# Patient Record
Sex: Female | Born: 1945 | Race: White | Hispanic: No | Marital: Married | Smoking: Never smoker
Health system: Southern US, Community
[De-identification: ages and names within clinical notes are randomized; demographics above are authoritative.]

## PROBLEM LIST (undated history)

## (undated) DIAGNOSIS — K589 Irritable bowel syndrome without diarrhea: Secondary | ICD-10-CM

## (undated) DIAGNOSIS — C4491 Basal cell carcinoma of skin, unspecified: Secondary | ICD-10-CM

## (undated) DIAGNOSIS — G4733 Obstructive sleep apnea (adult) (pediatric): Secondary | ICD-10-CM

## (undated) DIAGNOSIS — F410 Panic disorder [episodic paroxysmal anxiety] without agoraphobia: Secondary | ICD-10-CM

## (undated) DIAGNOSIS — E669 Obesity, unspecified: Secondary | ICD-10-CM

## (undated) DIAGNOSIS — I4891 Unspecified atrial fibrillation: Secondary | ICD-10-CM

## (undated) DIAGNOSIS — N3944 Nocturnal enuresis: Secondary | ICD-10-CM

## (undated) HISTORY — DX: Obesity, unspecified: E66.9

## (undated) HISTORY — PX: TONSILLECTOMY: SUR1361

## (undated) HISTORY — DX: Obstructive sleep apnea (adult) (pediatric): G47.33

---
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
## 1998-12-29 ENCOUNTER — Encounter: Admission: RE | Admit: 1998-12-29 | Discharge: 1998-12-29 | Payer: Self-pay | Admitting: Family Medicine

## 1998-12-29 ENCOUNTER — Encounter: Payer: Self-pay | Admitting: Family Medicine

## 2000-03-06 ENCOUNTER — Encounter: Payer: Self-pay | Admitting: Family Medicine

## 2000-03-06 ENCOUNTER — Encounter: Admission: RE | Admit: 2000-03-06 | Discharge: 2000-03-06 | Payer: Self-pay | Admitting: Family Medicine

## 2000-03-10 ENCOUNTER — Other Ambulatory Visit: Admission: RE | Admit: 2000-03-10 | Discharge: 2000-03-10 | Payer: Self-pay | Admitting: Gynecology

## 2000-04-18 ENCOUNTER — Encounter: Admission: RE | Admit: 2000-04-18 | Discharge: 2000-04-18 | Payer: Self-pay | Admitting: Gynecology

## 2000-04-18 ENCOUNTER — Encounter: Payer: Self-pay | Admitting: Gynecology

## 2001-12-03 ENCOUNTER — Ambulatory Visit (HOSPITAL_COMMUNITY): Admission: RE | Admit: 2001-12-03 | Discharge: 2001-12-03 | Payer: Self-pay | Admitting: Gastroenterology

## 2001-12-03 ENCOUNTER — Encounter (INDEPENDENT_AMBULATORY_CARE_PROVIDER_SITE_OTHER): Payer: Self-pay | Admitting: *Deleted

## 2002-01-21 ENCOUNTER — Encounter: Admission: RE | Admit: 2002-01-21 | Discharge: 2002-01-21 | Payer: Self-pay | Admitting: Gynecology

## 2002-01-21 ENCOUNTER — Encounter: Payer: Self-pay | Admitting: Gynecology

## 2002-05-27 ENCOUNTER — Emergency Department (HOSPITAL_COMMUNITY): Admission: EM | Admit: 2002-05-27 | Discharge: 2002-05-27 | Payer: Self-pay | Admitting: Emergency Medicine

## 2002-05-27 ENCOUNTER — Encounter: Payer: Self-pay | Admitting: Emergency Medicine

## 2002-06-04 ENCOUNTER — Encounter: Admission: RE | Admit: 2002-06-04 | Discharge: 2002-06-04 | Payer: Self-pay | Admitting: Family Medicine

## 2002-06-04 ENCOUNTER — Encounter: Payer: Self-pay | Admitting: Family Medicine

## 2002-06-11 ENCOUNTER — Emergency Department (HOSPITAL_COMMUNITY): Admission: EM | Admit: 2002-06-11 | Discharge: 2002-06-11 | Payer: Self-pay | Admitting: Emergency Medicine

## 2002-10-11 ENCOUNTER — Encounter: Payer: Self-pay | Admitting: Family Medicine

## 2002-10-11 ENCOUNTER — Encounter: Admission: RE | Admit: 2002-10-11 | Discharge: 2002-10-11 | Payer: Self-pay | Admitting: Family Medicine

## 2003-01-10 ENCOUNTER — Other Ambulatory Visit: Admission: RE | Admit: 2003-01-10 | Discharge: 2003-01-10 | Payer: Self-pay | Admitting: Family Medicine

## 2003-01-24 ENCOUNTER — Encounter: Admission: RE | Admit: 2003-01-24 | Discharge: 2003-01-24 | Payer: Self-pay | Admitting: Family Medicine

## 2004-02-09 ENCOUNTER — Encounter: Admission: RE | Admit: 2004-02-09 | Discharge: 2004-02-09 | Payer: Self-pay | Admitting: Family Medicine

## 2006-08-27 ENCOUNTER — Ambulatory Visit (HOSPITAL_COMMUNITY): Admission: RE | Admit: 2006-08-27 | Discharge: 2006-08-27 | Payer: Self-pay | Admitting: Gastroenterology

## 2006-08-27 ENCOUNTER — Encounter (INDEPENDENT_AMBULATORY_CARE_PROVIDER_SITE_OTHER): Payer: Self-pay | Admitting: Gastroenterology

## 2007-02-11 ENCOUNTER — Inpatient Hospital Stay (HOSPITAL_COMMUNITY): Admission: RE | Admit: 2007-02-11 | Discharge: 2007-02-13 | Payer: Self-pay | Admitting: Orthopedic Surgery

## 2008-12-22 ENCOUNTER — Encounter: Admission: RE | Admit: 2008-12-22 | Discharge: 2008-12-22 | Payer: Self-pay | Admitting: Family Medicine

## 2009-02-01 ENCOUNTER — Encounter: Admission: RE | Admit: 2009-02-01 | Discharge: 2009-02-01 | Payer: Self-pay | Admitting: Family Medicine

## 2010-06-26 NOTE — Op Note (Signed)
NAMEJANNICE, BEITZEL                   ACCOUNT NO.:  [id number]   MEDICAL RECORD NO.:  [id number]          PATIENT TYPE:  AMB   LOCATION:  ENDO                         FACILITY:  [facility]   PHYSICIAN:  Anselmo Rod, M.D.  DATE OF BIRTH:  1945-05-16   DATE OF PROCEDURE:  08/27/2006  DATE OF DISCHARGE:                               OPERATIVE REPORT   PROCEDURE PERFORMED:  Colonoscopy with multiple cold biopsies.   ENDOSCOPIST:  Anselmo Rod, M.D.   INSTRUMENT USED:  Pentax video colonoscope.   INDICATIONS FOR PROCEDURE:  A 65 year old white female with a history of  diarrhea undergoing a colonoscopy to rule out colonic polyps, masses,  etc.  Colonic biopsies are planned to rule out collagenous colitis if no  other abnormalities are noted.   PRE-PROCEDURE PREPARATION:  Informed consent was procured from the  patient.  The patient was fasted for eight hours prior to the procedure  and prepped with a bottle of magnesium citrate and a gallon of NuLytely  the night prior to the procedure.  Risks and benefits of the procedure,  including a 10% missed rate of cancer and polyps, were discussed with  the patient as well.   PRE-PROCEDURE PHYSICAL EXAMINATION:  VITAL SIGNS:  Stable vital signs.  NECK:  Supple.  CHEST:  Clear to auscultation.  HEART:  S1 and S2.  Regular.  ABDOMEN:  Soft with normal bowel sounds.   DESCRIPTION OF PROCEDURE:  The patient was placed in the left lateral  decubitus position and sedated with an additional 75 mcg of Fentanyl and  8 mg of Versed given intravenously in slow, incremental doses. Once the  patient was adequately sedated, maintained on low flow oxygen, and  continuous cardiac monitoring, the Pentax video colonoscope was advanced  from the rectum to the cecum.  The appendicle orifices and ileocecal  valve were visualized after multiple washes.  There was some residual  stool in the right colon.  The terminal ileum appeared healthy and  without lesions.   Small internal hemorrhoids were seen on retroflexion.  No masses, erosions, ulcerations, or polyps were identified. There was  no evidence of diverticulosis.  Random colonic biopsies were done to  rule out collagenous versus lymphocytic colitis. The patient tolerated  the procedure well without immediate complications.   IMPRESSION:  1. Normal colonoscopy up to the terminal ileum, random colon biopsies      done to rule out collagenous versus lymphocytic colitis.  2. Small internal hemorrhoids seen on retroflexion.  3. A normal terminal ileum.  4. No masses, polyps, or diverticula noted.   RECOMMENDATIONS:  1. Await pathology results.  2. Avoid all nonsteroidals and aspirin for the next two weeks.  3. Repeat colonoscopy in the next 10 years unless the patient has any      abnormal symptoms in the interim.  4. Outpatient followup as the needs arise in the future.  The patient      tells me that the Align she was started on has helped her symptoms      greatly.  Anselmo Rod, M.D.  Electronically Signed     JNM/MEDQ  D:  08/27/2006  T:  08/27/2006  Job:  [id number]   cc:   Talmadge Coventry, M.D.  Fax: [phone]

## 2010-06-26 NOTE — Op Note (Signed)
Beth Haynes, Beth Haynes                   ACCOUNT NO.:  [id number]   MEDICAL RECORD NO.:  [id number]          PATIENT TYPE:  AMB   LOCATION:  ENDO                         FACILITY:  [facility]   PHYSICIAN:  Anselmo Rod, M.D.  DATE OF BIRTH:  Feb 19, 1945   DATE OF PROCEDURE:  08/27/2006  DATE OF DISCHARGE:                               OPERATIVE REPORT   PROCEDURE PERFORMED:  Esophagogastroduodenoscopy with small-bowel  biopsies.   ENDOSCOPIST:  Anselmo Rod.   INSTRUMENT USED:  Pentax video colonoscope.   INDICATIONS FOR PROCEDURE:  A 65 year old white female with a history of  diarrhea, rule out sprue.  Small-bowel biopsies planned.   PREPROCEDURE PREPARATION:  Informed consent was procured from the  patient.  The patient was fasted for 8 hours prior to procedure.  Risks  and benefits of the procedure were discussed with the patient in great  detail.   PREPROCEDURE PHYSICAL:  The patient had stable vital signs.  NECK:  Supple.  CHEST:  Clear to auscultation.  S1/S2 regular.  ABDOMEN:  Soft with normal bowel sounds.   DESCRIPTION OF PROCEDURE:  The patient was placed in the left lateral  decubitus position and sedated with 50 mcg of Fntanyl and 4 mg of Versed  given intravenously in slow incremental doses. Once the patient was  adequately sedated and maintained on low-flow oxygen and continuous  cardiac monitoring, the Pentax video panendoscope was advanced through  the mouthpiece over the tongue into the esophagus under direct vision.  The entire esophagus was widely patent with no evidence of ring,  stricture, mass, esophagitis or Barrett's mucosa. The scope was then  advanced into the stomach.  The Z-line appeared healthy. There was a  small hiatal hernia seen on high retroflexion, and mild antral gastritis  was noted.  The proximal small bowel appeared normal.  Small-bowel  biopsies were done to rule out sprue.  The patient tolerated the  procedure well without immediate  complications.   IMPRESSION:  1. Normal-appearing esophagus.  2. Small hiatal hernia.  3. Mild antral gastritis.  4. Normal proximal small bowel. Small-bowel biopsies done to rule out      sprue.  5. No ulcers, erosions, masses or polyps seen.   RECOMMENDATIONS:  1. Await pathology results.  2. Avoid all nonsteroidals for now.  3. No aspirin products or Advil or Aleve for the next 2 weeks.  4. Proceed with a colonoscopy at this time.  5. Further recommendations to be made thereafter.      Anselmo Rod, M.D.  Electronically Signed     JNM/MEDQ  D:  08/27/2006  T:  08/28/2006  Job:  [id number]   cc:   Talmadge Coventry, M.D.  Fax: [phone]

## 2010-06-26 NOTE — Discharge Summary (Signed)
Beth Haynes, Beth Haynes                   ACCOUNT NO.:  [id number]   MEDICAL RECORD NO.:  [id number]          PATIENT TYPE:  OIB   LOCATION:  5025                         FACILITY:  [facility]   PHYSICIAN:  Madelynn Done, MD  DATE OF BIRTH:  [birth year]   DATE OF ADMISSION:  02/11/2007  DATE OF DISCHARGE:  02/13/2007                               DISCHARGE SUMMARY   ADMISSION DIAGNOSES:  1. Left displaced fibula fracture.  2. Hypertension.   DISCHARGE DIAGNOSES:  1. Left displaced fibula fracture.  2. Hypertension.   PROCEDURES AND DATES:  Open reduction/internal fixation of left  displaced fibular fracture on February 11, 2007.   DISCHARGE MEDICATIONS:  1. Vicodin 1 to 2 tablets p.o. q.4-6 h. p.r.n. pain.  2. Colace 100 mg p.o. b.i.d.  3. Multivitamin 1 tablet p.o. daily.   CONSULTATION:  [facility] for home health and home PT.   REASON FOR ADMISSION:  [name] is a 65 year old female who fell  earlier this past week and sustained a displaced distal fibular  fracture.  Patient was seen and evaluated in my office on February 11, 2007, and after counseling with the patient, was admitted to the  hospital to undergo surgery to fix her displaced fibula fracture.   HOSPITAL COURSE:  Patient was admitted to the orthopedic floor following  the above procedure.  Patient tolerated the procedure well under general  anesthesia.  She was continued on IV antibiotics and oral and IV pain  medications postoperatively.  Home health and home PT were arranged on  the New Year's day.  Patient was seen and examined on postoperative day  number 2.  She was afebrile, her vital signs were stable and normal, she  was tolerating a regular diet, and felt to be ready to be discharged to  home on hospital day number 2.  On the day of discharge, the patient was  ambulating, nonweightbearing on the left lower extremity.  She was  tolerating a regular diet.  Her pain was controlled on oral pain  medications.   RECOMMENDATIONS AND DISPOSITION:  Patient will be discharged to home.  She will be followed up in the office on February 23, 2007.  She was  given the office telephone phone number to call if she has any problems  sooner.  She is going to keep her splint clean and dry, elevate, and  take the oral pain medication.  She needs to come back sooner if she has  any problems with her leg, fevers, chills, nausea, vomiting, worsening  pain or swelling in the left foot and ankle.  Prior to her discharge,  the patient's discharge instructions were explained to her, questions  were  answered, and the patient voiced understanding of discharge  instructions.  All questions were addressed prior to discharge.   CONDITION ON DISCHARGE:  Good.      Madelynn Done, MD  Electronically Signed     FWO/MEDQ  D:  02/13/2007  T:  02/13/2007  Job:  [id number]

## 2010-06-26 NOTE — Op Note (Signed)
Beth Haynes, Beth Haynes                   ACCOUNT NO.:  [id number]   MEDICAL RECORD NO.:  [id number]          PATIENT TYPE:  OIB   LOCATION:  5025                         FACILITY:  [facility]   PHYSICIAN:  Madelynn Done, MD  DATE OF BIRTH:  1945/02/14   DATE OF PROCEDURE:  02/11/2007  DATE OF DISCHARGE:                               OPERATIVE REPORT   PREOPERATIVE DIAGNOSIS:  Left distal fibular fracture, Weber B ,  displaced.   POSTOPERATIVE DIAGNOSIS:  Left distal fibular fracture, Weber B ,  displaced.   ATTENDING SURGEON:  Madelynn Done, MD, who was scrubbed and present  for the entire procedure.   ASSISTANT SURGEON:  None.   TOURNIQUET TIME:  Less than 1 hour at 300 mmHg.   ANESTHESIA:  General via laryngeal mask airway.   SURGICAL IMPLANT:  One 5 hole Synthes one-third tubular plate with five  35 cortical screws and one lag screw anterior to posterior.   SURGICAL INDICATIONS:  Beth Haynes is a 65 year old female who sustained a  closed left ankle fracture approximately three days ago.  She had been  walking on the ankle and was seen today in the office and noted to have  a displaced distal fibular fracture.  The patient was noted have medial  clear interval widening the displacement of the fibula and irregularity  the tibiotalar joint.  The patient was recommended to undergo the above  procedure.  Risks, benefits, alternatives were discussed in detail with  the patient.  Signed informed consent was obtained on the day of  surgery.   DESCRIPTION OF PROCEDURE:  The patient was properly identified in preop  holding area and a mark with a permanent marker was made on left ankle  to indicate the correct operative site.  The patient was then brought  back to the operating room, placed supine on the anesthesia room table.  General anesthesia was administered via LMA.  The patient previously had  undergone a popliteal block performed by Anesthesia.  He tolerated this  well.   Well-padded tourniquet was then placed on the left thigh and  sealed with U drapes.  The patient was then placed in a lazy lateral  position with the aid of a beanbag.  Left lower extremity were prepped  and draped with Betadine.  A time-out was called, the correct side was  identified and surgical procedure was then begun.   The limb was then elevated using Esmarch exsanguination tourniquet  insufflated.  A standard lateral approach to the distal fibula was then  used.  Dissection was carried down through the skin and subcutaneous  tissues.  Subcutaneous veins were then identified and using  electrocautery were then cauterized.  Dissection was carefully done to  protect the coursing branches of the superficial peroneal nerve.  Dissection was carried down all the way down to the bone over the  fracture site was then exposed.  Using the periosteal elevator the  fracture site was then exposed both proximally and distally.  Using a  small curette the fracture hematoma was then removed  and the fracture  site thoroughly irrigated.  After debridement of the fracture site, the  reduction clamp was then applied to the posterior lateral portion of the  distal fibula.  A five hole antiglide plate was then fashioned on the  posterior lateral surface of the fibula.  It was then fixed both  proximally and distally with two C3-5 cortical screws at the appropriate  drilling and depth gauge measurement.  An anterior-posterior lag screw  was then applied with compression across the fracture site by drilling  the far cortex and overdrilling the near cortex.  A 35 cortical screw  was then applied.  This was done in a lag fashion.  The remaining screw  holes were then filled with the tubular plate with a 35 bicortical  screws.  Following fixation the mini C-arm was then brought out of  brought in and under live stress radiography the ankle placed through a  range of motion and stress testing and cotton  testing was then performed  of the distal fibula and did not appear to be any significant widening  of syndesmosis and the syndesmosis did not appear to need additional  fixation.  There was good restoration of the alignment of the tibiotalar  joint after fixation of the distal fibula.  The wound was then  thoroughly irrigated.  The deep periosteal layer was then closed with a  2-0 Vicryl suture.  Subcutaneous tissues were then closed with 3-0  Vicryl and skin was then closed with a 4-0 running horizontal mattress  nylon suture.  18 mL of 0.25% Marcaine without epinephrine were then  injected locally for local field block.  Adaptic dressing was then  applied.  Sterile compressive dressing was then applied and the patient  was then placed in a well padded trilaminar splint.  He was extubated  and taken to recovery room in good condition.   Intraoperative radiograph three views of the ankle revealed good  anatomical alignment of the distal fibula with the five hole plate and  one anterior to posterior screw in place.  There did not appear to be  displacement under live fluoro and stress radiography.   POSTOPERATIVE PLAN:  The patient be admitted overnight for IV  antibiotics and postoperative pain control, physical therapy consult in  the morning for gait training.  She will likely discharged home  nonweightbearing to the first 4 weeks.  She will then be transitioned to  a short-arm cast.  She will be seen back in the office in 2 weeks for  application of short-leg cast and x-rays out of the splint.  Following  total of 4 weeks in a nonweightbearing short leg cast and transition  into a Cam walker boot.  Total of 6 weeks nonweightbearing.  Will begin  some active motion at the 4-week mark.  X-rays at the 2, 4 and 6 week  mark.      Madelynn Done, MD  Electronically Signed     FWO/MEDQ  D:  02/11/2007  T:  02/12/2007  Job:  [id number]

## 2010-06-29 NOTE — Op Note (Signed)
Beth Haynes, Beth Haynes                             ACCOUNT NO.:  [id number]   MEDICAL RECORD NO.:  [id number]                   PATIENT TYPE:  AMB   LOCATION:  ENDO                                 FACILITY:  [facility]   PHYSICIAN:  Anselmo Rod, M.D.               DATE OF BIRTH:  Aug 27, 1945   DATE OF PROCEDURE:  12/03/2001  DATE OF DISCHARGE:                                 OPERATIVE REPORT   PROCEDURE:  Colonoscopy with biopsies.   ENDOSCOPIST:  Anselmo Rod, M.D.   INSTRUMENT USED:  Olympus video colonoscope.   INDICATION FOR PROCEDURE:  A 65 year old white female undergoing screening  colonoscopy.  Rule out colonic polyps, masses, etc.   PREPROCEDURE PREPARATION:  Informed consent was procured from the patient.  The patient was fasted for eight hours prior to the procedure and prepped  with a bottle of magnesium citrate and a gallon of NuLytely the night prior  to the procedure.   PREPROCEDURE PHYSICAL:  VITAL SIGNS:  The patient had stable vital signs.  NECK:  Supple.  CHEST:  Clear to auscultation.  S1, S2 regular.  ABDOMEN:  Soft with normal bowel sounds.   DESCRIPTION OF PROCEDURE:  The patient was placed in the left lateral  decubitus position and sedated with 100 mg of Demerol and 10 mg of Versed  intravenously.  Once the patient was adequately sedate and maintained on low-  flow oxygen and continuous cardiac monitoring, the Olympus video colonoscope  was advanced from the rectum to the cecum and terminal ileum without  difficulty.  The appendiceal orifice and the ileocecal valve were clearly  visualized and photographed.  Erythematous changes were seen in the terminal  ileum with small ulceration.  This was biopsied to rule out IBD.  Small  internal hemorrhoids were seen on retroflexion.  The rest of the colonic  mucosa appeared normal and without lesions.  There were a few left-sided  diverticula appreciated as well.  The patient tolerated the procedure well  without complications.   IMPRESSION:  1. Small, nonbleeding internal hemorrhoid.  2. A few early left-sided diverticula.  3. Otherwise normal-appearing transverse colon, right colon, and cecum.  4. Erythematous mucosa with ulceration in terminal ileum, biopsies done to     rule out inflammatory bowel disease versus infectious ileitis.    RECOMMENDATIONS:  1. Await pathology results.  2. Avoid all nonsteroidals, including aspirin, for now.  3. Outpatient follow-up in the next two weeks for further recommendations.                                               Anselmo Rod, M.D.    JNM/MEDQ  D:  12/03/2001  T:  12/03/2001  Job:  [id number]   cc:  Talmadge Coventry, M.D.

## 2010-11-16 LAB — BASIC METABOLIC PANEL
CO2: 28
Calcium: 8.8
Creatinine, Ser: 0.57
GFR calc Af Amer: >60
Glucose, Bld: 99

## 2010-11-16 LAB — CBC
MCHC: 33.8
RDW: 14.3

## 2011-04-19 ENCOUNTER — Other Ambulatory Visit: Payer: Self-pay | Admitting: Internal Medicine

## 2011-04-19 DIAGNOSIS — Z1231 Encounter for screening mammogram for malignant neoplasm of breast: Secondary | ICD-10-CM

## 2011-05-03 ENCOUNTER — Ambulatory Visit
Admission: RE | Admit: 2011-05-03 | Discharge: 2011-05-03 | Disposition: A | Discharge status: Home / Self Care | Payer: Medicare Other | Source: Ambulatory Visit | Attending: Internal Medicine | Admitting: Internal Medicine

## 2011-05-03 DIAGNOSIS — Z1231 Encounter for screening mammogram for malignant neoplasm of breast: Secondary | ICD-10-CM

## 2014-04-26 ENCOUNTER — Other Ambulatory Visit: Payer: Self-pay

## 2014-04-26 DIAGNOSIS — Z1231 Encounter for screening mammogram for malignant neoplasm of breast: Secondary | ICD-10-CM

## 2014-05-13 ENCOUNTER — Ambulatory Visit
Admission: RE | Admit: 2014-05-13 | Discharge: 2014-05-13 | Disposition: A | Discharge status: Home / Self Care | Payer: Medicare Other | Source: Ambulatory Visit

## 2014-05-13 DIAGNOSIS — Z1231 Encounter for screening mammogram for malignant neoplasm of breast: Secondary | ICD-10-CM

## 2014-08-05 ENCOUNTER — Other Ambulatory Visit: Payer: Self-pay

## 2014-08-24 ENCOUNTER — Emergency Department (HOSPITAL_BASED_OUTPATIENT_CLINIC_OR_DEPARTMENT_OTHER)
Admission: EM | Admit: 2014-08-24 | Discharge: 2014-08-24 | Disposition: A | Discharge status: Home / Self Care | Payer: Medicare Other | Attending: Emergency Medicine | Admitting: Emergency Medicine

## 2014-08-24 ENCOUNTER — Encounter (HOSPITAL_BASED_OUTPATIENT_CLINIC_OR_DEPARTMENT_OTHER): Payer: Self-pay | Admitting: *Deleted

## 2014-08-24 ENCOUNTER — Emergency Department (HOSPITAL_BASED_OUTPATIENT_CLINIC_OR_DEPARTMENT_OTHER): Payer: Medicare Other

## 2014-08-24 DIAGNOSIS — Z79899 Other long term (current) drug therapy: Secondary | ICD-10-CM | POA: Insufficient documentation

## 2014-08-24 DIAGNOSIS — S52592A Other fractures of lower end of left radius, initial encounter for closed fracture: Secondary | ICD-10-CM | POA: Diagnosis not present

## 2014-08-24 DIAGNOSIS — S62102A Fracture of unspecified carpal bone, left wrist, initial encounter for closed fracture: Secondary | ICD-10-CM

## 2014-08-24 DIAGNOSIS — W1839XA Other fall on same level, initial encounter: Secondary | ICD-10-CM | POA: Diagnosis not present

## 2014-08-24 DIAGNOSIS — S6992XA Unspecified injury of left wrist, hand and finger(s), initial encounter: Secondary | ICD-10-CM | POA: Diagnosis present

## 2014-08-24 DIAGNOSIS — Y9289 Other specified places as the place of occurrence of the external cause: Secondary | ICD-10-CM | POA: Diagnosis not present

## 2014-08-24 DIAGNOSIS — Y9301 Activity, walking, marching and hiking: Secondary | ICD-10-CM | POA: Insufficient documentation

## 2014-08-24 DIAGNOSIS — Y998 Other external cause status: Secondary | ICD-10-CM | POA: Insufficient documentation

## 2014-08-24 MED ORDER — IBUPROFEN 800 MG PO TABS: 800.0000 mg | ORAL_TABLET | Freq: Three times a day (TID) | ORAL | Status: DC

## 2014-08-24 MED ORDER — IBUPROFEN 800 MG PO TABS
800.0000 mg | ORAL_TABLET | Freq: Once | ORAL | Status: AC
  Administered 2014-08-24: 800 mg via ORAL
  Filled 2014-08-24: qty 1

## 2014-08-24 NOTE — ED Notes (Signed)
States she was walking dog and fell onto grass. C/o left wrist pain. Swelling noted to left wrist.  Pos pulses and nl sensation and cap refill.

## 2014-08-24 NOTE — ED Notes (Signed)
MD at bedside. 

## 2014-08-24 NOTE — Discharge Instructions (Signed)
Take motrin for pain.  Apply ice to the wrist.   Please wear the splint at all times  See ortho or hand doctor when you come back in 10 days.   Return to ER if you have worse wrist pain, fingers turning blue.

## 2014-08-24 NOTE — ED Provider Notes (Signed)
CSN: [id number]     Arrival date & time 08/24/14  1749 History  This chart was scribed for Wandra Arthurs, MD by Steva Colder, ED Scribe. The patient was seen in room MH01/MH01 at [place] PM.     Chief Complaint  Patient presents with  . Fall     The history is provided by the patient. No language interpreter was used.    HPI Comments: Beth Haynes is a 69 y.o. female who presents to the Emergency Department complaining of fall onset PTA. Pt reports that she was walking the dog when the dog walked in another direction and she fell on the grass. She states that she is having associated symptoms of left wrist pain and left wrist swelling. She states that she has not tried any medications for the  relief for her symptoms. She denies CP, abdominal pain, hitting her head, and any other symptoms.   History reviewed. No pertinent past medical history. History reviewed. No pertinent past surgical history. No family history on file. History  Substance Use Topics  . Smoking status: Never Smoker   . Smokeless tobacco: Not on file  . Alcohol Use: Not on file   OB History    No data available     Review of Systems  Cardiovascular: Negative for chest pain.  Gastrointestinal: Negative for abdominal pain.  Musculoskeletal: Positive for joint swelling and arthralgias.  All other systems reviewed and are negative.     Allergies  Sulfa antibiotics  Home Medications   Prior to Admission medications   Medication Sig Start Date End Date Taking? Authorizing Provider  sertraline (ZOLOFT) 100 MG tablet Take 100 mg by mouth daily.   Yes Historical Provider, MD  sertraline (ZOLOFT) 50 MG tablet Take 50 mg by mouth daily.   Yes Historical Provider, MD   BP 186/98 mmHg  Pulse 75  Temp(Src) 98.2 F (36.8 C) (Oral)  Resp 16  Ht 5\' 2"  (1.575 m)  Wt 192 lb (87.091 kg)  BMI 35.11 kg/m2  SpO2 98% Physical Exam  Constitutional: She is oriented to person, place, and time. She appears well-developed and  well-nourished. No distress.  HENT:  Head: Normocephalic and atraumatic.  Eyes: EOM are normal.  Neck: Neck supple. No tracheal deviation present.  Cardiovascular: Normal rate.   Pulses:      Radial pulses are 2+ on the right side, and 2+ on the left side.  Pulmonary/Chest: Effort normal. No respiratory distress.  Musculoskeletal: Normal range of motion. She exhibits tenderness.       Left wrist: She exhibits tenderness. She exhibits normal range of motion.  Tenderness in the left wrist. Able to range left wrist. Nl hand grasp. Good cap refill.  Neurological: She is alert and oriented to person, place, and time.  Skin: Skin is warm and dry.  Psychiatric: She has a normal mood and affect. Her behavior is normal.  Nursing note and vitals reviewed.   ED Course  Procedures (including critical care time) DIAGNOSTIC STUDIES: Oxygen Saturation is 98% on RA, nl by my interpretation.    COORDINATION OF CARE: 6:15 PM-Discussed treatment plan with pt at bedside and pt agreed to plan.   Labs Review Labs Reviewed - No data to display  Imaging Review Dg Wrist Complete Left  08/24/2014   CLINICAL DATA:  Status post fall.  Left wrist pain.  EXAM: LEFT WRIST - COMPLETE 3+ VIEW  COMPARISON:  None.  FINDINGS: There is subtle cortical irregularity along the dorsal aspect  of the left distal radial metaphysis which may be projectional versus secondary to a nondisplaced fracture. Correlate with point tenderness.  There is no other acute fracture. There is generalized osteopenia. 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 Soft tissues are unremarkable.  IMPRESSION: 1. Subtle cortical irregularity along the dorsal aspect of the left distal radial metaphysis which may be projectional versus secondary to a nondisplaced fracture. Correlate with point tenderness.   Electronically Signed   By: Kathreen Devoid   On: 08/24/2014 18:56     EKG Interpretation None      MDM   Final diagnoses:  None    Beth Haynes is a 70 y.o. female here with L wrist injury. Consider sprain vs fracture. Will get xrays.   7:18 PM Xray showed possible fracture. She is traveling in several days and will be back in 10 days. Will place in splint and sling and have her see hand surgery when she comes back.   I personally performed the services described in this documentation, which was scribed in my presence. The recorded information has been reviewed and is accurate.    Wandra Arthurs, MD 08/24/14 [phone]

## 2014-10-10 ENCOUNTER — Encounter (HOSPITAL_COMMUNITY): Payer: Self-pay | Admitting: *Deleted

## 2014-10-10 ENCOUNTER — Emergency Department (HOSPITAL_COMMUNITY)
Admission: EM | Admit: 2014-10-10 | Discharge: 2014-10-11 | Disposition: A | Discharge status: Home / Self Care | Payer: Medicare Other | Attending: Emergency Medicine | Admitting: Emergency Medicine

## 2014-10-10 ENCOUNTER — Emergency Department (HOSPITAL_COMMUNITY): Payer: Medicare Other

## 2014-10-10 DIAGNOSIS — I4891 Unspecified atrial fibrillation: Secondary | ICD-10-CM | POA: Diagnosis not present

## 2014-10-10 DIAGNOSIS — Z79899 Other long term (current) drug therapy: Secondary | ICD-10-CM | POA: Diagnosis not present

## 2014-10-10 DIAGNOSIS — Z8719 Personal history of other diseases of the digestive system: Secondary | ICD-10-CM | POA: Insufficient documentation

## 2014-10-10 DIAGNOSIS — R42 Dizziness and giddiness: Secondary | ICD-10-CM

## 2014-10-10 DIAGNOSIS — R110 Nausea: Secondary | ICD-10-CM | POA: Insufficient documentation

## 2014-10-10 HISTORY — DX: Panic disorder (episodic paroxysmal anxiety): F41.0

## 2014-10-10 HISTORY — DX: Irritable bowel syndrome, unspecified: K58.9

## 2014-10-10 LAB — BASIC METABOLIC PANEL
Anion gap: 11 (ref 5–15)
BUN: 10 mg/dL (ref 6–20)
CALCIUM: 9.9 mg/dL (ref 8.9–10.3)
CO2: 23 mmol/L (ref 22–32)
CREATININE: 0.75 mg/dL (ref 0.44–1.00)
Chloride: 108 mmol/L (ref 101–111)
GFR calc non Af Amer: >60 mL/min (ref >60)
GLUCOSE: 130 mg/dL — AB (ref 65–99)
Potassium: 4.2 mmol/L (ref 3.5–5.1)
Sodium: 142 mmol/L (ref 135–145)

## 2014-10-10 LAB — URINE MICROSCOPIC-ADD ON

## 2014-10-10 LAB — I-STAT TROPONIN, ED: Troponin i, poc: 0.01 ng/mL (ref 0.00–0.08)

## 2014-10-10 LAB — CBC
HCT: 49.3 % — ABNORMAL HIGH (ref 36.0–46.0)
Hemoglobin: 16.7 g/dL — ABNORMAL HIGH (ref 12.0–15.0)
MCH: 29.2 pg (ref 26.0–34.0)
MCHC: 33.9 g/dL (ref 30.0–36.0)
MCV: 86.3 fL (ref 78.0–100.0)
PLATELETS: 234 10*3/uL (ref 150–400)
RBC: 5.71 MIL/uL — AB (ref 3.87–5.11)
RDW: 14.4 % (ref 11.5–15.5)
WBC: 6.9 10*3/uL (ref 4.0–10.5)

## 2014-10-10 LAB — URINALYSIS, ROUTINE W REFLEX MICROSCOPIC
BILIRUBIN URINE: NEGATIVE
GLUCOSE, UA: NEGATIVE mg/dL
HGB URINE DIPSTICK: NEGATIVE
KETONES UR: 15 mg/dL — AB
Leukocytes, UA: NEGATIVE
Nitrite: NEGATIVE
PH: 6 (ref 5.0–8.0)
PROTEIN: 30 mg/dL — AB
Specific Gravity, Urine: 1.02 (ref 1.005–1.030)
Urobilinogen, UA: 0.2 mg/dL (ref 0.0–1.0)

## 2014-10-10 LAB — CBG MONITORING, ED: Glucose-Capillary: 118 mg/dL — ABNORMAL HIGH (ref 65–99)

## 2014-10-10 MED ORDER — SODIUM CHLORIDE 0.9 % IV BOLUS (SEPSIS)
1000.0000 mL | Freq: Once | INTRAVENOUS | Status: AC
  Administered 2014-10-10: 1000 mL via INTRAVENOUS

## 2014-10-10 MED ORDER — ONDANSETRON HCL 4 MG/2ML IJ SOLN
4.0000 mg | Freq: Once | INTRAMUSCULAR | Status: AC
Start: 2014-10-10 — End: 2014-10-10
  Administered 2014-10-10: 4 mg via INTRAVENOUS
  Filled 2014-10-10: qty 2

## 2014-10-10 NOTE — ED Provider Notes (Signed)
CSN: [id number]     Arrival date & time 10/10/14  1628 History   First MD Initiated Contact with Patient 10/10/14 1745     Chief Complaint  Patient presents with  . Nausea  . Dizziness     (Consider location/radiation/quality/duration/timing/severity/associated sxs/prior Treatment) Patient is a 69 y.o. female presenting with dizziness.  Dizziness Quality:  Head spinning Severity:  Moderate Onset quality:  Sudden Duration:  12 hours Timing:  Intermittent Progression:  Resolved Chronicity:  New Context: standing up   Relieved by:  None tried Worsened by:  Nothing Ineffective treatments:  None tried Associated symptoms: nausea   Associated symptoms: no chest pain, no diarrhea, no headaches, no shortness of breath, no syncope and no vomiting   Risk factors: no heart disease     Past Medical History  Diagnosis Date  . IBS (irritable bowel syndrome)   . Panic attacks    History reviewed. No pertinent past surgical history. History reviewed. No pertinent family history. Social History  Substance Use Topics  . Smoking status: Never Smoker   . Smokeless tobacco: Never Used  . Alcohol Use: No   OB History    No data available     Review of Systems  Constitutional: Negative for fever and chills.  HENT: Negative for congestion and sore throat.   Eyes: Negative for visual disturbance.  Respiratory: Negative for cough, shortness of breath and wheezing.   Cardiovascular: Negative for chest pain and syncope.  Gastrointestinal: Positive for nausea. Negative for vomiting, abdominal pain, diarrhea and constipation.  Genitourinary: Negative for dysuria, difficulty urinating and vaginal pain.  Musculoskeletal: Negative for myalgias and arthralgias.  Skin: Negative for rash.  Neurological: Positive for dizziness. Negative for syncope and headaches.  Psychiatric/Behavioral: Negative for behavioral problems.  All other systems reviewed and are negative.     Allergies  Sulfa  antibiotics  Home Medications   Prior to Admission medications   Medication Sig Start Date End Date Taking? Authorizing Provider  DimenhyDRINATE (DRAMAMINE PO) Take 1 tablet by mouth daily as needed. For dizzy per patient   Yes Historical Provider, MD  OVER THE COUNTER MEDICATION Take 1 tablet by mouth daily. AB Gard For IBS   Yes Historical Provider, MD  sertraline (ZOLOFT) 100 MG tablet Take 100 mg by mouth daily.   Yes Historical Provider, MD  sertraline (ZOLOFT) 50 MG tablet Take 50 mg by mouth daily.   Yes Historical Provider, MD  ibuprofen (ADVIL,MOTRIN) 800 MG tablet Take 1 tablet (800 mg total) by mouth 3 (three) times daily. Patient not taking: Reported on 10/10/2014 08/24/14   Wandra Arthurs, MD   BP 134/91 mmHg  Pulse 100  Temp(Src) 97.7 F (36.5 C) (Oral)  Resp 18  Ht 5\' 2"  (1.575 m)  Wt 188 lb (85.276 kg)  BMI 34.38 kg/m2  SpO2 95% Physical Exam  Constitutional: She is oriented to person, place, and time. She appears well-developed and well-nourished. No distress.  HENT:  Head: Normocephalic and atraumatic.  Eyes: EOM are normal.  Neck: Normal range of motion.  Cardiovascular: Normal rate, regular rhythm and normal heart sounds.   No murmur heard. Pulmonary/Chest: Effort normal and breath sounds normal. No respiratory distress. She has no wheezes.  Abdominal: Soft. There is no tenderness.  Musculoskeletal: She exhibits no edema.  Neurological: She is alert and oriented to person, place, and time. GCS eye subscore is 4. GCS verbal subscore is 5. GCS motor subscore is 6.  Skin: She is not diaphoretic.  Psychiatric:  She has a normal mood and affect. Her behavior is normal.    ED Course  Procedures (including critical care time) Labs Review Labs Reviewed  BASIC METABOLIC PANEL - Abnormal; Notable for the following:    Glucose, Bld 130 (*)    All other components within normal limits  CBC - Abnormal; Notable for the following:    RBC 5.71 (*)    Hemoglobin 16.7 (*)     HCT 49.3 (*)    All other components within normal limits  URINALYSIS, ROUTINE W REFLEX MICROSCOPIC (NOT AT [facility]) - Abnormal; Notable for the following:    APPearance CLOUDY (*)    Ketones, ur 15 (*)    Protein, ur 30 (*)    All other components within normal limits  CBG MONITORING, ED - Abnormal; Notable for the following:    Glucose-Capillary 118 (*)    All other components within normal limits  URINE MICROSCOPIC-ADD ON  Randolm Idol, ED    Imaging Review Mr Brain Wo Contrast  10/10/2014   CLINICAL DATA:  Nausea, dizziness, abnormal EKG, symptoms began at 0300 hours.  EXAM: MRI HEAD WITHOUT CONTRAST  TECHNIQUE: Multiplanar, multiecho pulse sequences of the brain and surrounding structures were obtained without intravenous contrast.  COMPARISON:  None.  FINDINGS: The ventricles and sulci are normal for patient's age. No suspicious parenchymal signal, mass lesions, mass effect. Patchy supratentorial and pontine white matter FLAIR T2 hyperintense signal. No reduced diffusion to suggest acute ischemia. Scattered small foci of susceptibility within central and peripheral distribution, supra- and infratentorial brain. Old bilateral basal ganglia lacunar infarcts in a background of punctate T2 hyperintense perivascular spaces.  No abnormal extra-axial fluid collections. No extra-axial masses though, contrast enhanced sequences would be more sensitive. Dolicoectatic intracranial vascular flow voids seen at the skull base.  Ocular globes and orbital contents are unremarkable though not tailored for evaluation. No abnormal sellar expansion. Visualized paranasal sinuses and mastoid air cells are well-aerated. No suspicious calvarial bone marrow signal. No abnormal sellar expansion. Craniocervical junction maintained.  IMPRESSION: No acute intracranial process, specifically no ischemia.  Involutional changes. Moderate white matter changes compatible with chronic small vessel ischemic disease. Old  bilateral basal ganglia lacunar infarcts.  Dolicoectatic intracranial vessels in addition to scattered small foci of susceptibility artifact, findings which are associated with chronic hypertension.   Electronically Signed   By: Elon Alas M.D.   On: 10/10/2014 22:20   I have personally reviewed and evaluated these images and lab results as part of my medical decision-making.   EKG Interpretation   Date/Time:  Monday October 10 2014 16:37:43 EDT Ventricular Rate:  97 PR Interval:  128 QRS Duration: 86 QT Interval:  368 QTC Calculation: 467 R Axis:   31 Text Interpretation:  Normal sinus rhythm Normal ECG No acute changes  Confirmed by Kathrynn Humble, MD, Thelma Comp [phone]) on 10/10/2014 8:38:00 PM      MDM   Final diagnoses:  Dizzy  Atrial fibrillation, unspecified     Patient is a 69 year old female with past medical history of IBS who presents with 1 day of nausea, dizziness. Patient saw her PCP and an EKG showed atrial fibrillation with RVR. Patient recently C Dramamine and had improvement of dizziness but nausea remains. On arrival to the ED the patient is afebrile with stable vital signs. Patient is currently in normal sinus rhythm without any evidence of ischemia or arrhythmia. Patient on neuro exam has no focal deficits and had a negative Dicks Hallpike. Patient was given Zofran  and had resolution of symptoms. Given the patient's dizziness with no peripheral testing and MRI was performed which did not show any posterior lesion. There is no clear reason for the patient's dizziness however given his resolved we will discharge with return precautions. Patient is to follow-up with cardiology for her atrial fibrillation with RVR. Given that she is converted back to sinus rhythm no anticoagulation is indicated.    Renne Musca, MD 10/11/14 5997  Varney Biles, MD 10/12/14 [phone]

## 2014-10-10 NOTE — Discharge Instructions (Signed)
Atrial Fibrillation °Atrial fibrillation is a type of irregular heart rhythm (arrhythmia). During atrial fibrillation, the upper chambers of the heart (atria) quiver continuously in a chaotic pattern. This causes an irregular and often rapid heart rate.  °Atrial fibrillation is the result of the heart becoming overloaded with disorganized signals that tell it to beat. These signals are normally released one at a time by a part of the right atrium called the sinoatrial node. They then travel from the atria to the lower chambers of the heart (ventricles), causing the atria and ventricles to contract and pump blood as they pass. In atrial fibrillation, parts of the atria outside of the sinoatrial node also release these signals. This results in two problems. First, the atria receive so many signals that they do not have time to fully contract. Second, the ventricles, which can only receive one signal at a time, beat irregularly and out of rhythm with the atria.  °There are three types of atrial fibrillation:  °· Paroxysmal. Paroxysmal atrial fibrillation starts suddenly and stops on its own within a week. °· Persistent. Persistent atrial fibrillation lasts for more than a week. It may stop on its own or with treatment. °· Permanent. Permanent atrial fibrillation does not go away. Episodes of atrial fibrillation may lead to permanent atrial fibrillation. °Atrial fibrillation can prevent your heart from pumping blood normally. It increases your risk of stroke and can lead to heart failure.  °CAUSES  °· Heart conditions, including a heart attack, heart failure, coronary artery disease, and heart valve conditions.   °· Inflammation of the sac that surrounds the heart (pericarditis). °· Blockage of an artery in the lungs (pulmonary embolism). °· Pneumonia or other infections. °· Chronic lung disease. °· Thyroid problems, especially if the thyroid is overactive (hyperthyroidism). °· Caffeine, excessive alcohol use, and use  of some illegal drugs.   °· Use of some medicines, including certain decongestants and diet pills. °· Heart surgery.   °· Birth defects.   °Sometimes, no cause can be found. When this happens, the atrial fibrillation is called lone atrial fibrillation. The risk of complications from atrial fibrillation increases if you have lone atrial fibrillation and you are age 60 years or older. °RISK FACTORS °· Heart failure. °· Coronary artery disease. °· Diabetes mellitus.   °· High blood pressure (hypertension).   °· Obesity.   °· Other arrhythmias.   °· Increased age. °SIGNS AND SYMPTOMS  °· A feeling that your heart is beating rapidly or irregularly.   °· A feeling of discomfort or pain in your chest.   °· Shortness of breath.   °· Sudden light-headedness or weakness.   °· Getting tired easily when exercising.   °· Urinating more often than normal (mainly when atrial fibrillation first begins).   °In paroxysmal atrial fibrillation, symptoms may start and suddenly stop. °DIAGNOSIS  °Your health care provider may be able to detect atrial fibrillation when taking your pulse. Your health care provider may have you take a test called an ambulatory electrocardiogram (ECG). An ECG records your heartbeat patterns over a 24-hour period. You may also have other tests, such as: °· Transthoracic echocardiogram (TTE). During echocardiography, sound waves are used to evaluate how blood flows through your heart. °· Transesophageal echocardiogram (TEE). °· Stress test. There is more than one type of stress test. If a stress test is needed, ask your health care provider about which type is best for you. °· Chest X-ray exam. °· Blood tests. °· Computed tomography (CT). °TREATMENT  °Treatment may include: °· Treating any underlying conditions. For example, if you   have an overactive thyroid, treating the condition may correct atrial fibrillation.  Taking medicine. Medicines may be given to control a rapid heart rate or to prevent blood  clots, heart failure, or a stroke.  Having a procedure to correct the rhythm of the heart:  Electrical cardioversion. During electrical cardioversion, a controlled, low-energy shock is delivered to the heart through your skin. If you have chest pain, very low blood pressure, or sudden heart failure, this procedure may need to be done as an emergency.  Catheter ablation. During this procedure, heart tissues that send the signals that cause atrial fibrillation are destroyed.  Surgical ablation. During this surgery, thin lines of heart tissue that carry the abnormal signals are destroyed. This procedure can either be an open-heart surgery or a minimally invasive surgery. With the minimally invasive surgery, small cuts are made to access the heart instead of a large opening.  Pulmonary venous isolation. During this surgery, tissue around the veins that carry blood from the lungs (pulmonary veins) is destroyed. This tissue is thought to carry the abnormal signals. HOME CARE INSTRUCTIONS   Take medicines only as directed by your health care provider. Some medicines can make atrial fibrillation worse or recur.  If blood thinners were prescribed by your health care provider, take them exactly as directed. Too much blood-thinning medicine can cause bleeding. If you take too little, you will not have the needed protection against stroke and other problems.  Perform blood tests at home if directed by your health care provider. Perform blood tests exactly as directed.  Quit smoking if you smoke.  Do not drink alcohol.  Do not drink caffeinated beverages such as coffee, soda, and some teas. You may drink decaffeinated coffee, soda, or tea.   Maintain a healthy weight.Do not use diet pills unless your health care provider approves. They may make heart problems worse.   Follow diet instructions as directed by your health care provider.  Exercise regularly as directed by your health care  provider.  Keep all follow-up visits as directed by your health care provider. This is important. PREVENTION  The following substances can cause atrial fibrillation to recur:   Caffeinated beverages.  Alcohol.  Certain medicines, especially those used for breathing problems.  Certain herbs and herbal medicines, such as those containing ephedra or ginseng.  Illegal drugs, such as cocaine and amphetamines. Sometimes medicines are given to prevent atrial fibrillation from recurring. Proper treatment of any underlying condition is also important in helping prevent recurrence.  SEEK MEDICAL CARE IF:  You notice a change in the rate, rhythm, or strength of your heartbeat.  You suddenly begin urinating more frequently.  You tire more easily when exerting yourself or exercising. SEEK IMMEDIATE MEDICAL CARE IF:   You have chest pain, abdominal pain, sweating, or weakness.  You feel nauseous.  You have shortness of breath.  You suddenly have swollen feet and ankles.  You feel dizzy.  Your face or limbs feel numb or weak.  You have a change in your vision or speech. MAKE SURE YOU:   Understand these instructions.  Will watch your condition.  Will get help right away if you are not doing well or get worse. Document Released: 01/28/2005 Document Revised: 06/14/2013 Document Reviewed: 03/10/2012 [facility] Patient Information 2015 [place], [place]. This information is not intended to replace advice given to you by your health care provider. Make sure you discuss any questions you have with your health care provider.  Benign Positional Vertigo Vertigo means  you feel like you or your surroundings are moving when they are not. Benign positional vertigo is the most common form of vertigo. Benign means that the cause of your condition is not serious. Benign positional vertigo is more common in older adults. CAUSES  Benign positional vertigo is the result of an upset in the labyrinth  system. This is an area in the middle ear that helps control your balance. This may be caused by a viral infection, head injury, or repetitive motion. However, often no specific cause is found. SYMPTOMS  Symptoms of benign positional vertigo occur when you move your head or eyes in different directions. Some of the symptoms may include:  Loss of balance and falls.  Vomiting.  Blurred vision.  Dizziness.  Nausea.  Involuntary eye movements (nystagmus). DIAGNOSIS  Benign positional vertigo is usually diagnosed by physical exam. If the specific cause of your benign positional vertigo is unknown, your caregiver may perform imaging tests, such as magnetic resonance imaging (MRI) or computed tomography (CT). TREATMENT  Your caregiver may recommend movements or procedures to correct the benign positional vertigo. Medicines such as meclizine, benzodiazepines, and medicines for nausea may be used to treat your symptoms. In rare cases, if your symptoms are caused by certain conditions that affect the inner ear, you may need surgery. HOME CARE INSTRUCTIONS   Follow your caregiver's instructions.  Move slowly. Do not make sudden body or head movements.  Avoid driving.  Avoid operating heavy machinery.  Avoid performing any tasks that would be dangerous to you or others during a vertigo episode.  Drink enough fluids to keep your urine clear or pale yellow. SEEK IMMEDIATE MEDICAL CARE IF:   You develop problems with walking, weakness, numbness, or using your arms, hands, or legs.  You have difficulty speaking.  You develop severe headaches.  Your nausea or vomiting continues or gets worse.  You develop visual changes.  Your family or friends notice any behavioral changes.  Your condition gets worse.  You have a fever.  You develop a stiff neck or sensitivity to light. MAKE SURE YOU:   Understand these instructions.  Will watch your condition.  Will get help right away if  you are not doing well or get worse. Document Released: 11/05/2005 Document Revised: 04/22/2011 Document Reviewed: 10/18/2010 [facility] Patient Information 2015 [place], [place]. This information is not intended to replace advice given to you by your health care provider. Make sure you discuss any questions you have with your health care provider.

## 2014-10-10 NOTE — ED Notes (Signed)
Pt sent over from PCP with c/o nausea, dizziness and abnormal EKG. EKG done at PCP shows possible atrial flutter with RVR. Pt denies chest pain, endorses nausea and dizziness since 0300 this morning. Pt states she got up to use the bathroom and noticed she was very dizziness. Pt also reports headache behind right eye since the onset of nausea dizziness.

## 2014-11-02 ENCOUNTER — Encounter: Payer: Self-pay | Admitting: *Deleted

## 2014-11-02 ENCOUNTER — Telehealth: Payer: Self-pay | Admitting: *Deleted

## 2014-11-02 NOTE — Telephone Encounter (Signed)
called, spoke with pt, got fm hx information.Marland Kitchen

## 2014-11-07 ENCOUNTER — Encounter: Payer: Self-pay | Admitting: Cardiovascular Disease

## 2014-11-07 ENCOUNTER — Ambulatory Visit (INDEPENDENT_AMBULATORY_CARE_PROVIDER_SITE_OTHER): Payer: Medicare Other | Admitting: Cardiovascular Disease

## 2014-11-07 ENCOUNTER — Telehealth: Payer: Self-pay

## 2014-11-07 VITALS — BP 140/90 | HR 64 | Ht 62.0 in | Wt 191.0 lb

## 2014-11-07 DIAGNOSIS — R42 Dizziness and giddiness: Secondary | ICD-10-CM

## 2014-11-07 DIAGNOSIS — I480 Paroxysmal atrial fibrillation: Secondary | ICD-10-CM | POA: Diagnosis not present

## 2014-11-07 MED ORDER — APIXABAN 5 MG PO TABS: 5.0000 mg | ORAL_TABLET | Freq: Two times a day (BID) | ORAL | Status: DC

## 2014-11-07 NOTE — Patient Instructions (Addendum)
Medication Instructions:  START Eliquis 5 mg twice daily - take 12 hours apart    Labwork:  TODAY - CBC, basic metabolic panel, thyroid   Your physician recommends that you return for lab work (CBC, Basic metabolic panel) in: 1 month on same day as appointment with [facility] for Eliquis follow-up   Testing/Procedures: Your physician has requested that you have an echocardiogram. Echocardiography is a painless test that uses sound waves to create images of your heart. It provides your doctor with information about the size and shape of your heart and how well your heart's chambers and valves are working. This procedure takes approximately one hour. There are no restrictions for this procedure.  Your physician has recommended that you wear a holter monitor. Holter monitors are medical devices that record the heart's electrical activity. Doctors most often use these monitors to diagnose arrhythmias. Arrhythmias are problems with the speed or rhythm of the heartbeat. The monitor is a small, portable device. You can wear one while you do your normal daily activities. This is usually used to diagnose what is causing palpitations/syncope (passing out).    Follow-Up:  Your physician recommends that you schedule a follow-up appointment in: 1 month with [facility] for Eliquis follow-up  Your physician recommends that you schedule a follow-up appointment in: 3 months with Dr. Acie Fredrickson

## 2014-11-07 NOTE — Telephone Encounter (Signed)
Prior auth for Eliquis 5 mg sent to [facility]. 

## 2014-11-07 NOTE — Progress Notes (Addendum)
Cardiology Office Note   Date:  11/07/2014   ID:  Beth, Haynes [birth year], MRN [id number]  PCP:  Velna Hatchet, MD  Cardiologist:   Thayer Headings, MD   Chief Complaint  Patient presents with  . Abnormal ECG   Problem List 1. Possible paroxysmal atrial fib    History of Present Illness: Beth Haynes is a 69 y.o. female who presents for evaluation of possible atrial fib. She woke up with dizziness, room was spinning  Was seen by PA. Presented to the ER .  Head MRI was normal.  Symptoms sound like vertigo   She still has some occasional episodes of dizziness.   Has occasional episodes of palpitations ,  She thinks it may be related to panic attacks. No CP or dyspnea. Does not get any regular exercise.   Past Medical History  Diagnosis Date  . IBS (irritable bowel syndrome)   . Panic attacks     No past surgical history on file.   Current Outpatient Prescriptions  Medication Sig Dispense Refill  . OVER THE COUNTER MEDICATION Take 1 tablet by mouth daily. AB Gard For IBS    . sertraline (ZOLOFT) 100 MG tablet Take 100 mg by mouth daily.    . sertraline (ZOLOFT) 50 MG tablet Take 50 mg by mouth daily.     No current facility-administered medications for this visit.    Allergies:   Sulfa antibiotics    Social History:  The patient  reports that she has never smoked. She has never used smokeless tobacco. She reports that she does not drink alcohol or use illicit drugs.   Family History:  The patient's family history includes Heart attack in her father and paternal grandfather; Heart failure in her mother; Hypertension in her maternal grandmother.    ROS:  Please see the history of present illness.    Review of Systems: Constitutional:  denies fever, chills, diaphoresis, appetite change and fatigue.  HEENT: denies photophobia, eye pain, redness, hearing loss, ear pain, congestion, sore throat, rhinorrhea, sneezing, neck pain, neck stiffness and tinnitus.    Respiratory: denies SOB, DOE, cough, chest tightness, and wheezing.  Cardiovascular: admits to  , palpitations , denies leg swelling.  Gastrointestinal: denies nausea, vomiting, abdominal pain, diarrhea, constipation, blood in stool.  Genitourinary: denies dysuria, urgency, frequency, hematuria, flank pain and difficulty urinating.  Musculoskeletal: denies  myalgias, back pain, joint swelling, arthralgias and gait problem.   Skin: denies pallor, rash and wound.  Neurological: admits to dizziness,  And vertigo symptoms   Hematological: denies adenopathy, easy bruising, personal or family bleeding history.  Psychiatric/ Behavioral: denies suicidal ideation, mood changes, confusion, nervousness, sleep disturbance and agitation.       All other systems are reviewed and negative.    PHYSICAL EXAM: VS:  BP 140/90 mmHg  Pulse 64  Ht 5\' 2"  (1.575 m)  Wt 86.637 kg (191 lb)  BMI 34.93 kg/m2 , BMI Body mass index is 34.93 kg/(m^2). GEN: Well nourished, well developed, in no acute distress HEENT: normal Neck: no JVD, carotid bruits, or masses Cardiac: RRR; no murmurs, rubs, or gallops,no edema  Respiratory:  clear to auscultation bilaterally, normal work of breathing GI: soft, nontender, nondistended, + BS MS: no deformity or atrophy Skin: warm and dry, no rash Neuro:  Strength and sensation are intact Psych: normal   EKG:  EKG is ordered today. The ekg ordered today demonstrates NSR at 64.  NS ST abn.   ECG from [facility] shows atrial fibrillation with a heart rate of 144.  Recent Labs: 10/10/2014: BUN 10; Creatinine, Ser 0.75; Hemoglobin 16.7*; Platelets 234; Potassium 4.2; Sodium 142    Lipid Panel No results found for: CHOL, TRIG, HDL, CHOLHDL, VLDL, LDLCALC, LDLDIRECT    Wt Readings from Last 3 Encounters:  11/07/14 86.637 kg (191 lb)  10/10/14 85.276 kg (188 lb)  08/24/14 87.091 kg (192 lb)      Other studies Reviewed: Additional studies/ records  that were reviewed today include: . Review of the above records demonstrates:    ASSESSMENT AND PLAN:  1.  Vertigo:   Further eval per her primary md.  2.  paroxysmal atrial fib:  CHADS2VASC is 2 ( age, female )  She does not want to be on a blood thinner She has dizziness and falls frequently with these episodes.  Falls several times a month ( or stumbles against the wall )  She is concerned because her mother died of a intracerebral hemorrhage due to a fall.  Review of the ECG from [place] shows atrial fib She has concerns about starting  But she did agree to start Eliquis 5 mg twice a day.  We will need to keep  a close eye on her falling history.  we'll check a CBC, basic medical profile, and TSH today. We will also get an echo cardiogram.   3. Elevated hemoglobin: The patient knows that she snores. She's not sure if she has apical episodes. Her husband is hard of hearing so he doesn't know she has apneic episodes. I've recommended that she call her medical doctor and request a sleep study. If she is found have sleep apnea this may be continuing to her paroxysmal atrial fibrillation.  Current medicines are reviewed at length with the patient today.  The patient does not have concerns regarding medicines.  The following changes have been made:  no change  Labs/ tests ordered today include:  No orders of the defined types were placed in this encounter.     Disposition:   FU with me in 3 months .      Nahser, Wonda Cheng, MD  11/07/2014 10:40 AM    [facility] [address], [place], [place]  [address] Phone: [phone]; Fax: [phone]   [facility]  [address] [place], [place]  [address] [phone]   Fax [phone]

## 2014-11-08 ENCOUNTER — Telehealth: Payer: Self-pay

## 2014-11-08 LAB — BASIC METABOLIC PANEL
BUN: 20 mg/dL (ref 6–23)
CALCIUM: 9.7 mg/dL (ref 8.4–10.5)
CO2: 30 meq/L (ref 19–32)
CREATININE: 0.77 mg/dL (ref 0.40–1.20)
Chloride: 106 mEq/L (ref 96–112)
GFR: 78.67 mL/min (ref >60.00)
Glucose, Bld: 105 mg/dL — ABNORMAL HIGH (ref 70–99)
Potassium: 4.2 mEq/L (ref 3.5–5.1)
Sodium: 141 mEq/L (ref 135–145)

## 2014-11-08 LAB — CBC WITH DIFFERENTIAL/PLATELET
Basophils Absolute: 0 10*3/uL (ref 0.0–0.1)
Basophils Relative: 0.5 % (ref 0.0–3.0)
EOS PCT: 3.6 % (ref 0.0–5.0)
Eosinophils Absolute: 0.2 10*3/uL (ref 0.0–0.7)
HEMATOCRIT: 45.8 % (ref 36.0–46.0)
Hemoglobin: 15.3 g/dL — ABNORMAL HIGH (ref 12.0–15.0)
LYMPHS ABS: 1.3 10*3/uL (ref 0.7–4.0)
LYMPHS PCT: 25 % (ref 12.0–46.0)
MCHC: 33.5 g/dL (ref 30.0–36.0)
MCV: 87 fl (ref 78.0–100.0)
MONOS PCT: 5.9 % (ref 3.0–12.0)
Monocytes Absolute: 0.3 10*3/uL (ref 0.1–1.0)
NEUTROS ABS: 3.4 10*3/uL (ref 1.4–7.7)
NEUTROS PCT: 65 % (ref 43.0–77.0)
Platelets: 233 10*3/uL (ref 150.0–400.0)
RBC: 5.27 Mil/uL — AB (ref 3.87–5.11)
RDW: 15.3 % (ref 11.5–15.5)
WBC: 5.3 10*3/uL (ref 4.0–10.5)

## 2014-11-08 LAB — TSH: TSH: 1.53 u[IU]/mL (ref 0.35–4.50)

## 2014-11-08 NOTE — Telephone Encounter (Signed)
Eliquis 5mg  approved  Till 11/08/2015. [id number].

## 2014-11-10 ENCOUNTER — Ambulatory Visit (INDEPENDENT_AMBULATORY_CARE_PROVIDER_SITE_OTHER): Payer: Medicare Other

## 2014-11-10 DIAGNOSIS — R42 Dizziness and giddiness: Secondary | ICD-10-CM | POA: Diagnosis not present

## 2014-11-10 DIAGNOSIS — I480 Paroxysmal atrial fibrillation: Secondary | ICD-10-CM

## 2014-11-12 ENCOUNTER — Telehealth: Payer: Self-pay | Admitting: Cardiology

## 2014-11-12 NOTE — Telephone Encounter (Signed)
Monitoring service called, she had PAF with rates 120-150.  They were unable to reach and I was unable to reach the pt, left a message.  She is on eliquis.

## 2014-11-13 NOTE — Telephone Encounter (Signed)
Pt has known paroxysmal atrial fib. It appears that her falling episodes are not related to going into or out of atrial fib She is on Eliquis

## 2014-11-14 ENCOUNTER — Telehealth: Payer: Self-pay | Admitting: Cardiovascular Disease

## 2014-11-14 NOTE — Telephone Encounter (Signed)
New message     Pt is wearing a monitor.  She received a call from [employer] on Saturday around 6-6:30 am stating they received an irr heart beat and wanted to know if she was ok.  Please call

## 2014-11-14 NOTE — Telephone Encounter (Signed)
Spoke with patient who states she was up and down to the bathroom frequently to urinate Friday night/Saturday morning, didn't sleep well and then was awakened by the call from [employer] regarding the irregular heart rate.  She states she has felt fine since that time except for having severe thirst on Saturday.  She states she is having difficulty with the monitor beeping and had to take it off during the night last night so she could get some sleep.  I advised her to call [employer] for monitor troubleshooting.  I advised her that we will be notified of monitor readings outside of her normal parameters and to call the office for questions or concerns prior to next office visit.  She verbalized understanding and agreement.

## 2014-11-15 ENCOUNTER — Telehealth: Payer: Self-pay | Admitting: Cardiovascular Disease

## 2014-11-15 DIAGNOSIS — I4891 Unspecified atrial fibrillation: Secondary | ICD-10-CM

## 2014-11-15 NOTE — Telephone Encounter (Signed)
Dr. Acie Fredrickson has recommended that we go ahead and order sleep study for patient.

## 2014-11-15 NOTE — Telephone Encounter (Signed)
New message    Patient returning call back to nurse from yesterday.

## 2014-11-15 NOTE — Telephone Encounter (Signed)
Spoke with patient who is returning my call from yesterday regarding [employer] monitor report from Saturday morning 10/1.  Dr. Acie Fredrickson has reviewed the monitor report and is aware patient has a fib.  She was started on Eliquis at last office visit.  He advised that he would like to get a sleep study for evaluation of OSA as he believes the a fib may be the result of OSA.  She states that she continues to have problems with monitor reception which is causing the monitor to beep throughout the night.  She states she has removed the monitor during the night because of this.  Dr. Acie Fredrickson advised that since the a fib has been captured, she may d/c the monitor.  I advised her to call back if she has episodes or symptoms of fast heart rate.  She states that currently she has more energy that she has had prior to visit.

## 2014-11-24 ENCOUNTER — Telehealth: Payer: Self-pay

## 2014-11-24 NOTE — Telephone Encounter (Signed)
called for samples of eliquis until mail order delivery is sent out on 12/07/14 and I explained that if she needs another sample box before recieveing mail order to just call and see if we have samples.  Eliquis 5 MG samples left up font for patient to pick up  Thayer Headings, MD at 11/07/2014 10:39 AM  Medication Instructions:  START Eliquis 5 mg twice daily - take 12 hours apart  Thayer Headings, MD at 11/13/2014 7:45 PM   Status: Signed     Pt has known paroxysmal atrial fib. It appears that her falling episodes are not related to going into or out of atrial fib She is on Eliquis

## 2014-11-30 ENCOUNTER — Other Ambulatory Visit: Payer: Self-pay | Admitting: *Deleted

## 2014-11-30 MED ORDER — APIXABAN 5 MG PO TABS: 5.0000 mg | ORAL_TABLET | Freq: Two times a day (BID) | ORAL | Status: DC

## 2014-12-06 ENCOUNTER — Telehealth: Payer: Self-pay

## 2014-12-06 NOTE — Telephone Encounter (Signed)
Patient Assist. App mailed to patient for Eliquis 5mg .

## 2014-12-07 ENCOUNTER — Other Ambulatory Visit (INDEPENDENT_AMBULATORY_CARE_PROVIDER_SITE_OTHER): Payer: Medicare Other | Admitting: *Deleted

## 2014-12-07 ENCOUNTER — Ambulatory Visit: Payer: Medicare Other | Admitting: Pharmacist

## 2014-12-07 ENCOUNTER — Ambulatory Visit (HOSPITAL_COMMUNITY): Payer: Medicare Other | Attending: Cardiology

## 2014-12-07 ENCOUNTER — Ambulatory Visit (INDEPENDENT_AMBULATORY_CARE_PROVIDER_SITE_OTHER): Payer: Medicare Other | Admitting: *Deleted

## 2014-12-07 ENCOUNTER — Other Ambulatory Visit: Payer: Self-pay

## 2014-12-07 DIAGNOSIS — I340 Nonrheumatic mitral (valve) insufficiency: Secondary | ICD-10-CM | POA: Insufficient documentation

## 2014-12-07 DIAGNOSIS — I071 Rheumatic tricuspid insufficiency: Secondary | ICD-10-CM | POA: Diagnosis not present

## 2014-12-07 DIAGNOSIS — I480 Paroxysmal atrial fibrillation: Secondary | ICD-10-CM | POA: Diagnosis not present

## 2014-12-07 DIAGNOSIS — K769 Liver disease, unspecified: Secondary | ICD-10-CM | POA: Insufficient documentation

## 2014-12-07 DIAGNOSIS — I517 Cardiomegaly: Secondary | ICD-10-CM | POA: Diagnosis not present

## 2014-12-07 DIAGNOSIS — I358 Other nonrheumatic aortic valve disorders: Secondary | ICD-10-CM | POA: Diagnosis not present

## 2014-12-07 DIAGNOSIS — I4891 Unspecified atrial fibrillation: Secondary | ICD-10-CM | POA: Insufficient documentation

## 2014-12-07 LAB — CBC
HEMATOCRIT: 45.9 % (ref 36.0–46.0)
Hemoglobin: 15.4 g/dL — ABNORMAL HIGH (ref 12.0–15.0)
MCH: 29.4 pg (ref 26.0–34.0)
MCHC: 33.6 g/dL (ref 30.0–36.0)
MCV: 87.6 fL (ref 78.0–100.0)
MPV: 10.6 fL (ref 8.6–12.4)
Platelets: 181 10*3/uL (ref 150–400)
RBC: 5.24 MIL/uL — ABNORMAL HIGH (ref 3.87–5.11)
RDW: 14.8 % (ref 11.5–15.5)
WBC: 3.9 10*3/uL — AB (ref 4.0–10.5)

## 2014-12-07 LAB — BASIC METABOLIC PANEL
BUN: 13 mg/dL (ref 7–25)
CO2: 28 mmol/L (ref 20–31)
CREATININE: 0.73 mg/dL (ref 0.60–0.93)
Calcium: 9.3 mg/dL (ref 8.6–10.4)
Chloride: 106 mmol/L (ref 98–110)
GLUCOSE: 81 mg/dL (ref 65–99)
Potassium: 3.9 mmol/L (ref 3.5–5.3)
Sodium: 141 mmol/L (ref 135–146)

## 2014-12-07 NOTE — Progress Notes (Signed)
Pt was started on Eliquis 5mg  twice a day for AFIB on September 26th, 2016 by Dr. Acie Fredrickson.    Reviewed patients medication list.  Pt is not currently on any combined P-gp and strong CYP3A4 inhibitors/inducers (ketoconazole, traconazole, ritonavir, carbamazepine, phenytoin, rifampin, St. John's wort).  Reviewed labs: SCr-0.73, Weight-87.4 kg, Hgb-15.4,HCT-45.9.  Dose appropriate based on dosing criteria.    A full discussion of the nature of anticoagulants has been carried out.  A benefit/risk analysis has been presented to the patient, so that they understand the justification for choosing anticoagulation with Eliquis at this time.  The need for compliance is stressed.  Pt is aware to take the medication twice daily.  Side effects of potential bleeding are discussed, including unusual colored urine or stools, coughing up blood or coffee ground emesis, nose bleeds or serious fall or head trauma.  Discussed signs and symptoms of stroke. The patient should avoid any OTC items containing aspirin or ibuprofen.  Avoid alcohol consumption.   Call if any signs of abnormal bleeding.  Discussed financial obligations and resolved any difficulty in obtaining medication.    12/07/14-Patient taken to the lab for venipuncture.  12/07/14-left a message for the patient that her lab results were back & that I will call in the morning to discuss.   12/08/14 left message to call back regarding lab results & to schedule an appointment On 12/09/14 spoke with patient and discussed lab results & instructed to continue taking Eliquis 5mg  twice a day and to call with any issues; she verbalized understanding and appt set for a 3 month follow-up.

## 2014-12-09 ENCOUNTER — Telehealth: Payer: Self-pay | Admitting: Cardiovascular Disease

## 2014-12-09 NOTE — Telephone Encounter (Signed)
Echo and lab results reviewed with patient who verbalized understanding and thanked me for the call

## 2014-12-09 NOTE — Telephone Encounter (Signed)
New message     Returning a call to a nurse 

## 2014-12-27 ENCOUNTER — Telehealth: Payer: Self-pay | Admitting: Cardiovascular Disease

## 2014-12-27 NOTE — Telephone Encounter (Signed)
New message     Patient calling wants to know what she can take for her cold

## 2014-12-27 NOTE — Telephone Encounter (Signed)
Spoke with patient and advised her that she can take Mucinex without decongestant and Tylenol for cold symptoms and/or aches and fever.  She verbalized understanding and agreement and thanked me for the call.

## 2015-01-11 ENCOUNTER — Ambulatory Visit (HOSPITAL_BASED_OUTPATIENT_CLINIC_OR_DEPARTMENT_OTHER): Payer: Medicare Other | Attending: Cardiovascular Disease | Admitting: Radiology

## 2015-01-11 VITALS — Ht 63.0 in | Wt 182.0 lb

## 2015-01-11 DIAGNOSIS — I4891 Unspecified atrial fibrillation: Secondary | ICD-10-CM

## 2015-01-11 DIAGNOSIS — Z79899 Other long term (current) drug therapy: Secondary | ICD-10-CM | POA: Diagnosis not present

## 2015-01-11 DIAGNOSIS — R0683 Snoring: Secondary | ICD-10-CM | POA: Diagnosis not present

## 2015-01-11 DIAGNOSIS — G4733 Obstructive sleep apnea (adult) (pediatric): Secondary | ICD-10-CM | POA: Diagnosis present

## 2015-01-11 DIAGNOSIS — I493 Ventricular premature depolarization: Secondary | ICD-10-CM | POA: Diagnosis not present

## 2015-01-17 ENCOUNTER — Ambulatory Visit (INDEPENDENT_AMBULATORY_CARE_PROVIDER_SITE_OTHER): Payer: Medicare Other | Admitting: Cardiovascular Disease

## 2015-01-17 ENCOUNTER — Encounter: Payer: Self-pay | Admitting: Cardiovascular Disease

## 2015-01-17 ENCOUNTER — Telehealth: Payer: Self-pay | Admitting: Cardiology

## 2015-01-17 ENCOUNTER — Encounter (HOSPITAL_BASED_OUTPATIENT_CLINIC_OR_DEPARTMENT_OTHER): Payer: Self-pay | Admitting: Radiology

## 2015-01-17 VITALS — BP 130/100 | HR 67 | Ht 63.0 in | Wt 195.8 lb

## 2015-01-17 DIAGNOSIS — I10 Essential (primary) hypertension: Secondary | ICD-10-CM

## 2015-01-17 DIAGNOSIS — G4733 Obstructive sleep apnea (adult) (pediatric): Secondary | ICD-10-CM | POA: Diagnosis not present

## 2015-01-17 DIAGNOSIS — I480 Paroxysmal atrial fibrillation: Secondary | ICD-10-CM

## 2015-01-17 DIAGNOSIS — Z9989 Dependence on other enabling machines and devices: Principal | ICD-10-CM

## 2015-01-17 HISTORY — DX: Obstructive sleep apnea (adult) (pediatric): G47.33

## 2015-01-17 NOTE — Telephone Encounter (Signed)
Please let patient know that they have significant sleep apnea but had unsuccessful CPAP titration due to severity of OSA>  Please set up full night BiPAP titration in lab.

## 2015-01-17 NOTE — Progress Notes (Signed)
Cardiology Office Note   Date:  01/17/2015   ID:  MALLI VALK, DOB [birth year], MRN [id number]  PCP:  Velna Hatchet, MD  Cardiologist:   Thayer Headings, MD   Chief Complaint  Patient presents with  . Follow-up    atrial fib   Problem List 1. Possible paroxysmal atrial fib    History of Present Illness: Beth Haynes is a 69 y.o. female who presents for evaluation of possible atrial fib. She woke up with dizziness, room was spinning  Was seen by PA. Presented to the ER .  Head MRI was normal.  Symptoms sound like vertigo   She still has some occasional episodes of dizziness.   Has occasional episodes of palpitations ,  She thinks it may be related to panic attacks. No CP or dyspnea. Does not get any regular exercise.   Dec. 6, 2016:  Kimara is seen today for follow up of her atrial fib.  BP is elevated today .   Ate some salty foods this weekend .    Past Medical History  Diagnosis Date  . IBS (irritable bowel syndrome)   . Panic attacks   . OSA (obstructive sleep apnea) 01/17/2015    Severe with AHI 35.8/hr    No past surgical history on file.   Current Outpatient Prescriptions  Medication Sig Dispense Refill  . apixaban (ELIQUIS) 5 MG TABS tablet Take 1 tablet (5 mg total) by mouth 2 (two) times daily. 180 tablet 3  . OVER THE COUNTER MEDICATION Take 1 tablet by mouth daily. AB Gard For IBS    . sertraline (ZOLOFT) 100 MG tablet Take 100 mg by mouth daily.    . sertraline (ZOLOFT) 50 MG tablet Take 50 mg by mouth daily.     No current facility-administered medications for this visit.    Allergies:   Sulfa antibiotics    Social History:  The patient  reports that she has never smoked. She has never used smokeless tobacco. She reports that she does not drink alcohol or use illicit drugs.   Family History:  The patient's family history includes Heart attack in her father and paternal grandfather; Heart failure in her mother; Hypertension in her maternal  grandmother.    ROS:  Please see the history of present illness.    Review of Systems: Constitutional:  denies fever, chills, diaphoresis, appetite change and fatigue.  HEENT: denies photophobia, eye pain, redness, hearing loss, ear pain, congestion, sore throat, rhinorrhea, sneezing, neck pain, neck stiffness and tinnitus.  Respiratory: denies SOB, DOE, cough, chest tightness, and wheezing.  Cardiovascular: admits to  , palpitations , denies leg swelling.  Gastrointestinal: denies nausea, vomiting, abdominal pain, diarrhea, constipation, blood in stool.  Genitourinary: denies dysuria, urgency, frequency, hematuria, flank pain and difficulty urinating.  Musculoskeletal: denies  myalgias, back pain, joint swelling, arthralgias and gait problem.   Skin: denies pallor, rash and wound.  Neurological: admits to dizziness,  And vertigo symptoms   Hematological: denies adenopathy, easy bruising, personal or family bleeding history.  Psychiatric/ Behavioral: denies suicidal ideation, mood changes, confusion, nervousness, sleep disturbance and agitation.       All other systems are reviewed and negative.    PHYSICAL EXAM: VS:  BP 130/100 mmHg  Pulse 67  Ht 5\' 3"  (1.6 m)  Wt 195 lb 12.8 oz (88.814 kg)  BMI 34.69 kg/m2  SpO2 97% , BMI Body mass index is 34.69 kg/(m^2). GEN: Well nourished, well developed, in no acute distress HEENT:  normal Neck: no JVD, carotid bruits, or masses Cardiac: RRR; no murmurs, rubs, or gallops,no edema  Respiratory:  clear to auscultation bilaterally, normal work of breathing GI: soft, nontender, nondistended, + BS MS: no deformity or atrophy Skin: warm and dry, no rash Neuro:  Strength and sensation are intact Psych: normal   EKG:  EKG is ordered today. The ekg ordered today demonstrates NSR at 64.  NS ST abn.   ECG from [facility] shows atrial fibrillation with a heart rate of 144.  Recent Labs: 11/07/2014: TSH 1.53 12/07/2014: BUN  13; Creat 0.73; Hemoglobin 15.4*; Platelets 181; Potassium 3.9; Sodium 141    Lipid Panel No results found for: CHOL, TRIG, HDL, CHOLHDL, VLDL, LDLCALC, LDLDIRECT    Wt Readings from Last 3 Encounters:  01/17/15 195 lb 12.8 oz (88.814 kg)  01/11/15 182 lb (82.555 kg)  11/07/14 191 lb (86.637 kg)      Other studies Reviewed: Additional studies/ records that were reviewed today include: . Review of the above records demonstrates:    ASSESSMENT AND PLAN:  1.  Vertigo:   Further eval per her primary md.  2.  paroxysmal atrial fib:  CHADS2VASC is 2 ( age, female )  She does not want to be on a blood thinner She has dizziness and falls frequently with these episodes.  Falls several times a month ( or stumbles against the wall )  She is concerned because her mother died of a intracerebral hemorrhage due to a fall.  Review of the ECG from [place] shows atrial fib She is tolerating the Eliquis  fairly well. She has normal left ventricle systolic function by echo. She has trace tricuspid regurgitation.   3. Obstructive sleep apnea: has had a sleep study that showed severe OSA.   Was not able get any benefit from CPAP.  Dr. Radford Pax has recommended that she have a repeat sleep study with BIPAP .   Will refer  to Dr. Radford Pax.   4. Essential HTN:  Will work, diet , exercise , avoiding salt .  Will reassess in 6 months    Current medicines are reviewed at length with the patient today.  The patient does not have concerns regarding medicines.  The following changes have been made:  no change  Labs/ tests ordered today include:  No orders of the defined types were placed in this encounter.     Disposition:   FU with me in 6 months .      Brantlee Penn, Wonda Cheng, MD  01/17/2015 4:28 PM    [facility] [place], [place], [place]  [address] Phone: [phone]; Fax: [phone]   [facility]  [address] [place], [place]   [address] [phone]   Fax [phone]

## 2015-01-17 NOTE — Patient Instructions (Signed)
Medication Instructions:  Your physician recommends that you continue on your current medications as directed. Please refer to the Current Medication list given to you today.   Labwork: None Ordered   Testing/Procedures: None Ordered   Follow-Up: Your physician wants you to follow-up in: 6 months with Dr. Nahser.  You will receive a reminder letter in the mail two months in advance. If you don't receive a letter, please call our office to schedule the follow-up appointment.   If you need a refill on your cardiac medications before your next appointment, please call your pharmacy.   Thank you for choosing [facility]! Darianna Amy, RN [phone]    

## 2015-01-17 NOTE — Sleep Study (Signed)
   Patient Name: Beth Haynes, Beth Haynes MRN: [id number] Study Date: 01/11/2015 Gender: Female D.O.B: Jun 27, 1945 Age (years): 76 Referring Provider: Mertie Moores Interpreting Physician: Fransico Him MD, ABSM RPSGT: Carolin Coy  Weight (lbs): 182 Height (inches): 63 BMI: 32 Neck Size: 13.50  CLINICAL INFORMATION The patient is referred for a split night study with BPAP.  MEDICATIONS Medications taken by the patient : Apixaban, Zoloft Medications administered by patient during sleep study : No sleep medicine administered.  SLEEP STUDY TECHNIQUE As per the AASM Manual for the Scoring of Sleep and Associated Events v2.3 (April 2016) with a hypopnea requiring 4% desaturations. The channels recorded and monitored were frontal, central and occipital EEG, electrooculogram (EOG), submentalis EMG (chin), nasal and oral airflow, thoracic and abdominal wall motion, anterior tibialis EMG, snore microphone, electrocardiogram, and pulse oximetry. Bi-level positive airway pressure (BiPAP) was initiated when the patient met split night criteria and was titrated according to treat sleep-disordered breathing.  RESPIRATORY PARAMETERS  Diagnostic Total AHI (/hr): 35.8  RDI (/hr):44.7   OA Index (/hr): 4.6  CA Index (/hr): 0.4 REM AHI (/hr): N/A  NREM AHI (/hr):35.8  Supine AHI (/hr):70.0  Non-supine AHI (/hr):14.34 Min O2 Sat (%):84.00  Mean O2 (%):92.49  Time below 88% (min):2.7      Titration Optimal IPAP Pressure (cm): N/A  Optimal EPAP Pressure (cm):N/A AHI at Optimal Pressure (/hr):N/A Min O2 at Optimal Pressure (%):N/A Sleep % at Optimal (%):N/A   Supine % at Optimal (%): N/A          SLEEP ARCHITECTURE The study was initiated at 10:43:14 PM and terminated at 5:15:28 AM. The total recorded time was 392.2 minutes. EEG confirmed total sleep time was 311.5 minutes yielding a reduced sleep efficiency of 79.4%. Sleep onset after lights out was 15.4 minutes with a prolonged REM latency of 295.0 minutes.  The patient spent 11.56% of the night in stage N1 sleep, 82.34% in stage N2 sleep, 0.32% in stage N3 and 5.78% in REM. Wake after sleep onset (WASO) was 65.3 minutes. The Arousal Index was 37.8/hour.  LEG MOVEMENT DATA The total Periodic Limb Movements of Sleep (PLMS) were 28. The PLMS index was 5.39 .  CARDIAC DATA The 2 lead EKG demonstrated sinus rhythm. The mean heart rate was 64.85 beats per minute. Other EKG findings include: PVCs.  IMPRESSIONS - Severe obstructive sleep apnea occurred during the diagnostic portion of the study (AHI = 35.8 /hour).  - No significant central sleep apnea occurred during the diagnostic portion of the study (CAI = 0.4/hour). - Moderate oxygen desaturation was noted during the diagnostic portion of the study (Min O2 = 84.00%). - The patient snored with Moderate snoring volume during the diagnostic portion of the study. - EKG findings include PVCs. - Mild periodic limb movements of sleep occurred during the study. DIAGNOSIS - Obstructive Sleep Apnea (327.23 [G47.33 ICD-10])  RECOMMENDATIONS - As patient was not able to be adequately titrated using CPAP, recommend a full night BiPAP titration.   - Avoid alcohol, sedatives and other CNS depressants that may worsen sleep apnea and disrupt normal sleep architecture. - Sleep hygiene should be reviewed to assess factors that may improve sleep quality. - Weight management and regular exercise should be initiated or continued.    Sueanne Margarita Diplomate, American Board of Sleep Medicine  ELECTRONICALLY SIGNED ON:  01/17/2015, 9:21 AM [place] PH: [phone]   FX: [phone] [place]

## 2015-01-18 NOTE — Addendum Note (Signed)
Addended by: Andres Ege on: 01/18/2015 12:39 PM   Modules accepted: Orders

## 2015-01-18 NOTE — Telephone Encounter (Signed)
Patient is aware of results. Stated verbal understanding.  Okay to proceed with setting up BiPAP titration.   Once it is approved through insurance, we will schedule and mail patient a letter with the date.

## 2015-01-30 ENCOUNTER — Ambulatory Visit: Payer: Medicare Other | Admitting: Cardiovascular Disease

## 2015-02-10 ENCOUNTER — Other Ambulatory Visit: Payer: Self-pay | Admitting: *Deleted

## 2015-02-10 ENCOUNTER — Encounter: Payer: Self-pay | Admitting: *Deleted

## 2015-02-10 DIAGNOSIS — G4733 Obstructive sleep apnea (adult) (pediatric): Secondary | ICD-10-CM

## 2015-03-13 ENCOUNTER — Ambulatory Visit (INDEPENDENT_AMBULATORY_CARE_PROVIDER_SITE_OTHER): Payer: Medicare Other | Admitting: *Deleted

## 2015-03-13 DIAGNOSIS — I4891 Unspecified atrial fibrillation: Secondary | ICD-10-CM | POA: Diagnosis not present

## 2015-03-13 LAB — BASIC METABOLIC PANEL
BUN: 12 mg/dL (ref 7–25)
CALCIUM: 9.6 mg/dL (ref 8.6–10.4)
CO2: 29 mmol/L (ref 20–31)
CREATININE: 0.64 mg/dL (ref 0.60–0.93)
Chloride: 104 mmol/L (ref 98–110)
GLUCOSE: 85 mg/dL (ref 65–99)
POTASSIUM: 3.8 mmol/L (ref 3.5–5.3)
SODIUM: 141 mmol/L (ref 135–146)

## 2015-03-13 LAB — CBC
HCT: 46 % (ref 36.0–46.0)
Hemoglobin: 15.1 g/dL — ABNORMAL HIGH (ref 12.0–15.0)
MCH: 28.3 pg (ref 26.0–34.0)
MCHC: 32.8 g/dL (ref 30.0–36.0)
MCV: 86.3 fL (ref 78.0–100.0)
MPV: 10.9 fL (ref 8.6–12.4)
Platelets: 221 10*3/uL (ref 150–400)
RBC: 5.33 MIL/uL — ABNORMAL HIGH (ref 3.87–5.11)
RDW: 14.6 % (ref 11.5–15.5)
WBC: 4.4 10*3/uL (ref 4.0–10.5)

## 2015-03-13 NOTE — Progress Notes (Signed)
Pt was started on Eliquis 5mg  twice a day for Afib on 11/07/2014 by Dr. Acie Fredrickson.    Reviewed patients medication list.  Pt is not currently on any combined P-gp and strong CYP3A4 inhibitors/inducers (ketoconazole, traconazole, ritonavir, carbamazepine, phenytoin, rifampin, St. John's wort).  Reviewed labs.  SCr 0.64, Weight-87.9kg, Age 70 yrs old.  Dose appropriate based on dosing criteria. Hgb and HCT 15.1/46.0.   A full discussion of the nature of anticoagulants has been carried out.  A benefit/risk analysis has been presented to the patient, so that they understand the justification for choosing anticoagulation with Eliquis at this time.  The need for compliance is stressed.  Pt is aware to take the medication twice daily.  Side effects of potential bleeding are discussed, including unusual colored urine or stools, coughing up blood or coffee ground emesis, nose bleeds or serious fall or head trauma.  Discussed signs and symptoms of stroke. The patient should avoid any OTC items containing aspirin or ibuprofen.  Avoid alcohol consumption.   Call if any signs of abnormal bleeding.  Discussed financial obligations and resolved any difficulty in obtaining medication.     Patient fell out of bed in December & broke left collar bone in December 2016 and sought medical attention at an Urgent Care in [place].  She states that the medication is costing $125/month and she is seeking financial assistance to assist with cost. However, she states she is willing to pay the $125 because she does not want to go on any alternate anticoagulant medications.   Instructed pt that we would contact her once labs returned.   03/15/15-Left message for pt to callback regarding lab results. 03/16/15-Spoke with pt and instructed to continue taking Eliquis 5mg  twice a day and to call with any issues or questions and she verbalized understanding. The patient is aware that she will follow up with Cardiologist.

## 2015-04-11 ENCOUNTER — Ambulatory Visit (HOSPITAL_BASED_OUTPATIENT_CLINIC_OR_DEPARTMENT_OTHER): Payer: Medicare Other | Attending: Cardiology

## 2015-04-11 VITALS — Ht 63.0 in | Wt 189.0 lb

## 2015-04-11 DIAGNOSIS — G4733 Obstructive sleep apnea (adult) (pediatric): Secondary | ICD-10-CM

## 2015-04-11 DIAGNOSIS — Z79899 Other long term (current) drug therapy: Secondary | ICD-10-CM | POA: Diagnosis not present

## 2015-04-11 DIAGNOSIS — R0683 Snoring: Secondary | ICD-10-CM | POA: Insufficient documentation

## 2015-04-11 DIAGNOSIS — G4730 Sleep apnea, unspecified: Secondary | ICD-10-CM | POA: Diagnosis present

## 2015-04-12 ENCOUNTER — Telehealth: Payer: Self-pay | Admitting: Cardiology

## 2015-04-12 NOTE — Sleep Study (Signed)
   Patient Name: Beth Haynes, Beth Haynes MRN: [id number] Study Date: 04/11/2015 Gender: Female D.O.B: May 24, 1945 Age (years): 32 Referring Provider: Mertie Moores Interpreting Physician: Fransico Him MD, ABSM RPSGT: Madelon Lips  Weight (lbs): 182 BMI: 32 Height (inches): 63 Neck Size: 13.50  CLINICAL INFORMATION The patient is referred for a BiPAP titration to treat sleep apnea.   SLEEP STUDY TECHNIQUE As per the AASM Manual for the Scoring of Sleep and Associated Events v2.3 (April 2016) with a hypopnea requiring 4% desaturations. The channels recorded and monitored were frontal, central and occipital EEG, electrooculogram (EOG), submentalis EMG (chin), nasal and oral airflow, thoracic and abdominal wall motion, anterior tibialis EMG, snore microphone, electrocardiogram, and pulse oximetry. Bilevel positive airway pressure (BPAP) was initiated at the beginning of the study and titrated to treat sleep-disordered breathing.  MEDICATIONS Medications administered by patient during sleep study : No sleep medicine administered.  Home medications:  Zoloft and Apixaban.    RESPIRATORY PARAMETERS Optimal IPAP Pressure (cm): 19  AHI at Optimal Pressure (/hr) 26.7 Optimal EPAP Pressure (cm):15   Overall Minimal O2 (%):86.00  Minimal O2 at Optimal Pressure (%):90.0  SLEEP ARCHITECTURE Start Time:10:05:03 PM  Stop Time:4:22:20 AM Total Time (min):377.3  Total Sleep Time (min):260.5  Sleep Latency (min):16.5 Sleep Efficiency (%):69.0 REM Latency (min):187.0  WASO (min):100.2 Stage N1 (%):15.16   Stage N2 (%):72.36  Stage N3 (%):8.06  Stage R (%):4.41   Supine (%):80.99  Arousal Index (/hr):42.6      CARDIAC DATA The 2 lead EKG demonstrated sinus rhythm. The mean heart rate was 60.21 beats per minute. Other EKG findings include: None.  LEG MOVEMENT DATA The total Periodic Limb Movements of Sleep (PLMS) were 1. The PLMS index was 0.23. A PLMS index of <15 is considered normal in  adults.  IMPRESSIONS - An optimal PAP pressure was selected for this patient ( 19 / cm of water) - Moderate Central Sleep Apnea was noted during this titration (CAI = 19.1/h). - Moderete oxygen desaturations were observed during this titration (min O2 = 86.00%). - The patient snored with Loud snoring volume. - No cardiac abnormalities were observed during this study. - Clinically significant periodic limb movements were not noted during this study. Arousals associated with PLMs were rare.  DIAGNOSIS - Obstructive Sleep Apnea (327.23 [G47.33 ICD-10])  RECOMMENDATIONS - Trial of BiPAP therapy on 19/15 cm H2O with a Small size Fisher&Paykel Full Face Mask Simplus mask and heated humidification.  Patient did have frequent Cental apneas on BiPAP so will get a download 1 week after initiation and if AHI remains high will need referral back to lab for BiPAP ASV titration. - Avoid alcohol, sedatives and other CNS depressants that may worsen sleep apnea and disrupt normal sleep architecture. - Sleep hygiene should be reviewed to assess factors that may improve sleep quality. - Weight management and regular exercise should be initiated or continued. - Return to Sleep Center for re-evaluation after 10 weeks of therapy   [place], American Board of Sleep Medicine  ELECTRONICALLY SIGNED ON:  04/12/2015, 8:51 PM [place] PH: [phone]   FX: [phone] [place]

## 2015-04-12 NOTE — Addendum Note (Signed)
Addended by: Sueanne Margarita on: 04/12/2015 08:57 PM   Modules accepted: Orders

## 2015-04-12 NOTE — Telephone Encounter (Signed)
Pt had successful PAP titration. Please setup appointment in 10 weeks. Please let [facility] know that order for PAP is in EPIC.  Please get a download 1 week after starting BiPAP

## 2015-04-14 NOTE — Telephone Encounter (Signed)
Patient informed of information. Stated verbal understanding.  Message sent to [facility] -   Once patient is on BiPAP we will schedule 10 week follow-up.

## 2015-05-19 ENCOUNTER — Telehealth: Payer: Self-pay | Admitting: *Deleted

## 2015-05-19 NOTE — Telephone Encounter (Signed)
Left message to call and schedule appointment with Dr. Radford Pax.   Appointment needs to be 10 weeks from 04/28/15

## 2015-06-01 ENCOUNTER — Encounter: Payer: Self-pay | Admitting: Cardiology

## 2015-06-05 NOTE — Progress Notes (Signed)
lmtcb

## 2015-06-07 ENCOUNTER — Telehealth: Payer: Self-pay | Admitting: Cardiology

## 2015-06-07 NOTE — Telephone Encounter (Signed)
Pt returned call to [facility] from yesterday-aware out today-pls call tomorrow

## 2015-06-08 NOTE — Telephone Encounter (Signed)
Patient contacted via result notes.

## 2015-06-12 ENCOUNTER — Telehealth: Payer: Self-pay | Admitting: *Deleted

## 2015-06-12 NOTE — Telephone Encounter (Signed)
We are sending patient over to [facility] at the Sleep (Per [facility] lab).    Patient needs to be fitted/given for a Resmed Airfit F20.

## 2015-06-30 ENCOUNTER — Telehealth: Payer: Self-pay | Admitting: *Deleted

## 2015-06-30 NOTE — Telephone Encounter (Signed)
Erroneous encounter

## 2015-07-11 ENCOUNTER — Encounter: Payer: Self-pay | Admitting: Cardiology

## 2015-07-11 ENCOUNTER — Ambulatory Visit (INDEPENDENT_AMBULATORY_CARE_PROVIDER_SITE_OTHER): Payer: Medicare Other | Admitting: Cardiology

## 2015-07-11 VITALS — BP 140/88 | HR 74 | Ht 63.0 in | Wt 190.8 lb

## 2015-07-11 DIAGNOSIS — E669 Obesity, unspecified: Secondary | ICD-10-CM

## 2015-07-11 DIAGNOSIS — I10 Essential (primary) hypertension: Secondary | ICD-10-CM | POA: Diagnosis not present

## 2015-07-11 DIAGNOSIS — G4733 Obstructive sleep apnea (adult) (pediatric): Secondary | ICD-10-CM

## 2015-07-11 HISTORY — DX: Obesity, unspecified: E66.9

## 2015-07-11 NOTE — Patient Instructions (Signed)
Medication Instructions:  Your physician recommends that you continue on your current medications as directed. Please refer to the Current Medication list given to you today.   Labwork: None  Testing/Procedures: None  Follow-Up: Your physician wants you to follow-up in: 1 year with Dr. Radford Pax. You will receive a reminder letter in the mail two months in advance. If you don't receive a letter, please call our office to schedule the follow-up appointment.   Any Other Special Instructions Will Be Listed Below (If Applicable). A new mask has been ordered for you. Please call Bethany at [phone] if you do not hear from [place] about your mask in the next day or so.    If you need a refill on your cardiac medications before your next appointment, please call your pharmacy.

## 2015-07-11 NOTE — Progress Notes (Signed)
Cardiology Office Note    Date:  07/11/2015   ID:  Beth Haynes, DOB December 07, 1945, MRN [id number]  PCP:  Velna Hatchet, MD  Cardiologist:  Fransico Him, MD   Chief Complaint  Patient presents with  . Sleep Apnea  . Hypertension    History of Present Illness:  Beth Haynes is a 70 y.o. female with a history of severe OSA with an AHI of 35.8/hr, obesity and panic attacks who presents today for evaluation of OSA.  She was tried on CPAP but it did not control her respiratory events.   She underwent BIPAP titration to 19/15cm H2O,  She is doing well with her BIPAP.  She feels the pressure is adequate but has had a hard time adjusting to the full face mask and would like to try a nasal mask.  She says that her mask is not fitting well and it keeps falling off her face.  This has resulted in her download showing noncompliance.  Since going on the BiPAP she feels much more rested in the am and has less daytime sleepiness.  SHe denies any mouth dryness or nasal congestion.      Past Medical History  Diagnosis Date  . IBS (irritable bowel syndrome)   . Panic attacks   . OSA (obstructive sleep apnea) 01/17/2015    Severe with AHI 35.8/hr  . Obesity (BMI 30-39.9) 07/11/2015    No past surgical history on file.  Current Medications: Outpatient Prescriptions Prior to Visit  Medication Sig Dispense Refill  . apixaban (ELIQUIS) 5 MG TABS tablet Take 1 tablet (5 mg total) by mouth 2 (two) times daily. 180 tablet 3  . OVER THE COUNTER MEDICATION Take 1 tablet by mouth daily. AB Gard For IBS    . sertraline (ZOLOFT) 100 MG tablet Take 100 mg by mouth daily.    . sertraline (ZOLOFT) 50 MG tablet Take 50 mg by mouth daily.     No facility-administered medications prior to visit.     Allergies:   Sulfa antibiotics   Social History   Social History  . Marital Status: Married    Spouse Name: N/A  . Number of Children: N/A  . Years of Education: N/A   Social History Main Topics  . Smoking  status: Never Smoker   . Smokeless tobacco: Never Used  . Alcohol Use: No  . Drug Use: No  . Sexual Activity: Not Asked   Other Topics Concern  . None   Social History Narrative     Family History:  The patient's family history includes Heart attack in her father and paternal grandfather; Heart failure in her mother; Hypertension in her maternal grandmother.   ROS:   Please see the history of present illness.    ROS All other systems reviewed and are negative.   PHYSICAL EXAM:   VS:  BP 140/88 mmHg  Pulse 74  Ht 5\' 3"  (1.6 m)  Wt 190 lb 12.8 oz (86.546 kg)  BMI 33.81 kg/m2  SpO2 94%   GEN: Well nourished, well developed, in no acute distress HEENT: normal Neck: no JVD, carotid bruits, or masses Cardiac: RRR; no murmurs, rubs, or gallops,no edema.  Intact distal pulses bilaterally.  Respiratory:  clear to auscultation bilaterally, normal work of breathing GI: soft, nontender, nondistended, + BS MS: no deformity or atrophy Skin: warm and dry, no rash Neuro:  Alert and Oriented x 3, Strength and sensation are intact Psych: euthymic mood, full affect  Wt Readings  from Last 3 Encounters:  07/11/15 190 lb 12.8 oz (86.546 kg)  04/11/15 189 lb (85.73 kg)  01/17/15 195 lb 12.8 oz (88.814 kg)      Studies/Labs Reviewed:   EKG:  EKG is not ordered today.    Recent Labs: 11/07/2014: TSH 1.53 03/13/2015: BUN 12; Creat 0.64; Hemoglobin 15.1*; Platelets 221; Potassium 3.8; Sodium 141   Lipid Panel No results found for: CHOL, TRIG, HDL, CHOLHDL, VLDL, LDLCALC, LDLDIRECT  Additional studies/ records that were reviewed today include:  Sleep study and BiPAP titration    ASSESSMENT:    1. OSA (obstructive sleep apnea)   2. Essential hypertension   3. Obesity (BMI 30-39.9)      PLAN:  In order of problems listed above:  OSA -  The patient has been using and benefiting from CPAP use and will continue to benefit from therapy. Per her request, I will order her a ResMed  AIrfit nasal mask with chin strap and get a d/l 4 weeks after using the new mask. 2.  HTN - BP controlled on current medical regimen.  Continue ARB. 3.  Obesity - I have encouraged him to get into a routine exercise program and cut back on carbs and portions.    Medication Adjustments/Labs and Tests Ordered: Current medicines are reviewed at length with the patient today.  Concerns regarding medicines are outlined above.  Medication changes, Labs and Tests ordered today are listed in the Patient Instructions below.  Patient Instructions  Medication Instructions:  Your physician recommends that you continue on your current medications as directed. Please refer to the Current Medication list given to you today.   Labwork: None  Testing/Procedures: None  Follow-Up: Your physician wants you to follow-up in: 1 year with Dr. Radford Pax. You will receive a reminder letter in the mail two months in advance. If you don't receive a letter, please call our office to schedule the follow-up appointment.   Any Other Special Instructions Will Be Listed Below (If Applicable). A new mask has been ordered for you. Please call Bethany at [phone] if you do not hear from [place] about your mask in the next day or so.    If you need a refill on your cardiac medications before your next appointment, please call your pharmacy.       Signed, Fransico Him, MD  07/13/2015 8:55 PM    [address], [place], [place]  [address] Phone: [phone]; Fax: [phone]

## 2015-07-18 ENCOUNTER — Ambulatory Visit: Payer: Medicare Other | Admitting: Cardiovascular Disease

## 2015-07-20 ENCOUNTER — Ambulatory Visit (INDEPENDENT_AMBULATORY_CARE_PROVIDER_SITE_OTHER): Payer: Medicare Other | Admitting: Cardiovascular Disease

## 2015-07-20 ENCOUNTER — Encounter: Payer: Self-pay | Admitting: Cardiovascular Disease

## 2015-07-20 VITALS — BP 120/90 | HR 68 | Ht 63.0 in | Wt 193.4 lb

## 2015-07-20 DIAGNOSIS — I480 Paroxysmal atrial fibrillation: Secondary | ICD-10-CM

## 2015-07-20 DIAGNOSIS — I10 Essential (primary) hypertension: Secondary | ICD-10-CM | POA: Diagnosis not present

## 2015-07-20 NOTE — Patient Instructions (Signed)
Medication Instructions:  Your physician recommends that you continue on your current medications as directed. Please refer to the Current Medication list given to you today.   Labwork: None Ordered   Testing/Procedures: None Ordered   Follow-Up: Your physician wants you to follow-up in: 6 months with Dr. Nahser.  You will receive a reminder letter in the mail two months in advance. If you don't receive a letter, please call our office to schedule the follow-up appointment.   If you need a refill on your cardiac medications before your next appointment, please call your pharmacy.   Thank you for choosing [facility]! Larken Urias, RN [phone]    

## 2015-07-20 NOTE — Progress Notes (Signed)
Cardiology Office Note   Date:  07/20/2015   ID:  Beth Haynes, DOB 10-04-1945, MRN [id number]  PCP:  Velna Hatchet, MD  Cardiologist:   Mertie Moores, MD   No chief complaint on file.  Problem List 1. Possible paroxysmal atrial fib 2. Obstructive sleep apnea:   Having difficulty in getting a CPAP mask to fit.    History of Present Illness: Beth Haynes is a 70 y.o. female who presents for evaluation of possible atrial fib. She woke up with dizziness, room was spinning  Was seen by PA. Presented to the ER .  Head MRI was normal.  Symptoms sound like vertigo   She still has some occasional episodes of dizziness.   Has occasional episodes of palpitations ,  She thinks it may be related to panic attacks. No CP or dyspnea. Does not get any regular exercise.   Has a cabin in [place]   Dec. 6, 2016:  Beth Haynes is seen today for follow up of her atrial fib.  BP is elevated today .   Ate some salty foods this weekend .   July 20, 2015:  Beth Haynes is seen back today .  Has PAF and OSA. She does not have a good CPAP mask that fits.     Past Medical History  Diagnosis Date  . IBS (irritable bowel syndrome)   . Panic attacks   . OSA (obstructive sleep apnea) 01/17/2015    Severe with AHI 35.8/hr  . Obesity (BMI 30-39.9) 07/11/2015    No past surgical history on file.   Current Outpatient Prescriptions  Medication Sig Dispense Refill  . apixaban (ELIQUIS) 5 MG TABS tablet Take 1 tablet (5 mg total) by mouth 2 (two) times daily. 180 tablet 3  . OVER THE COUNTER MEDICATION Take 1 tablet by mouth daily. AB Gard For IBS    . sertraline (ZOLOFT) 100 MG tablet Take 100 mg by mouth daily.    . sertraline (ZOLOFT) 50 MG tablet Take 50 mg by mouth daily.    Marland Kitchen losartan (COZAAR) 50 MG tablet Take 50 mg by mouth daily. Reported on 07/20/2015  5   No current facility-administered medications for this visit.    Allergies:   Sulfa antibiotics    Social History:   The patient  reports that she has never smoked. She has never used smokeless tobacco. She reports that she does not drink alcohol or use illicit drugs.   Family History:  The patient's family history includes Heart attack in her father and paternal grandfather; Heart failure in her mother; Hypertension in her maternal grandmother.    ROS:  Please see the history of present illness.    Review of Systems: Constitutional:  denies fever, chills, diaphoresis, appetite change and fatigue.  HEENT: denies photophobia, eye pain, redness, hearing loss, ear pain, congestion, sore throat, rhinorrhea, sneezing, neck pain, neck stiffness and tinnitus.  Respiratory: denies SOB, DOE, cough, chest tightness, and wheezing.  Cardiovascular: admits to  , palpitations , denies leg swelling.  Gastrointestinal: denies nausea, vomiting, abdominal pain, diarrhea, constipation, blood in stool.  Genitourinary: denies dysuria, urgency, frequency, hematuria, flank pain and difficulty urinating.  Musculoskeletal: denies  myalgias, back pain, joint swelling, arthralgias and gait problem.   Skin: denies pallor, rash and wound.  Neurological: admits to dizziness,  And vertigo symptoms   Hematological: denies adenopathy, easy bruising, personal or family bleeding history.  Psychiatric/ Behavioral: denies suicidal ideation, mood changes, confusion, nervousness,  sleep disturbance and agitation.       All other systems are reviewed and negative.    PHYSICAL EXAM: VS:  BP 120/90 mmHg  Pulse 68  Ht 5\' 3"  (1.6 m)  Wt 193 lb 6.4 oz (87.726 kg)  BMI 34.27 kg/m2  SpO2 96% , BMI Body mass index is 34.27 kg/(m^2). GEN: Well nourished, well developed, in no acute distress HEENT: normal Neck: no JVD, carotid bruits, or masses Cardiac: RRR; no murmurs, rubs, or gallops,no edema  Respiratory:  clear to auscultation bilaterally, normal work of breathing GI: soft, nontender, nondistended, + BS MS: no deformity or  atrophy Skin: warm and dry, no rash Neuro:  Strength and sensation are intact Psych: normal   EKG:  EKG is ordered today. The ekg ordered today demonstrates NSR at 64.  NS ST abn.   ECG from [facility] shows atrial fibrillation with a heart rate of 144.  Recent Labs: 11/07/2014: TSH 1.53 03/13/2015: BUN 12; Creat 0.64; Hemoglobin 15.1*; Platelets 221; Potassium 3.8; Sodium 141    Lipid Panel No results found for: CHOL, TRIG, HDL, CHOLHDL, VLDL, LDLCALC, LDLDIRECT    Wt Readings from Last 3 Encounters:  07/20/15 193 lb 6.4 oz (87.726 kg)  07/11/15 190 lb 12.8 oz (86.546 kg)  04/11/15 189 lb (85.73 kg)      Other studies Reviewed: Additional studies/ records that were reviewed today include: . Review of the above records demonstrates:    ASSESSMENT AND PLAN:  1.  Vertigo:   Further eval per her primary md.  2.  paroxysmal atrial fib:  CHADS2VASC is 2 ( age, female )  She does not want to be on a blood thinner  She has dizziness and falls frequently with these episodes.  .  Review of the ECG from [facility] shows atrial fib She is tolerating the Eliquis  fairly well. She has normal left ventricle systolic function by echo. She has trace tricuspid regurgitation.   3. Obstructive sleep apnea: has had a sleep study that showed severe OSA.   Was not able get any benefit from CPAP.   Working with Dr. Radford Pax.    4. Essential HTN:  Will work, diet , exercise , avoiding salt .  Will reassess in 6 months.     Current medicines are reviewed at length with the patient today.  The patient does not have concerns regarding medicines.  The following changes have been made:  no change  Labs/ tests ordered today include:  No orders of the defined types were placed in this encounter.     Disposition:   FU with me in 6 months .      Mertie Moores, MD  07/20/2015 12:03 PM    [place], [place], [place]  [address] Phone:  [phone]; Fax: [phone]   [facility]  [address] [place], [place]  [address] [phone]   Fax [phone]

## 2015-10-24 ENCOUNTER — Telehealth: Payer: Self-pay | Admitting: Cardiovascular Disease

## 2015-10-24 NOTE — Telephone Encounter (Signed)
Patient states she was prescribed Vesicare by her urologist today and wants to know if it is safe for her to take

## 2015-10-24 NOTE — Telephone Encounter (Signed)
Pt called stating she was prescribed Vesicare by her urologist and wants to know if it is safe for her to take. Called pt back. Pt unavailable. Left voice message for pt to call back.

## 2015-10-26 NOTE — Telephone Encounter (Signed)
This should be fine 

## 2015-10-26 NOTE — Telephone Encounter (Signed)
Left detailed message on patient's home voice mail that per Dr. Acie Fredrickson the medication should be fine to take and to call back with questions or concerns.

## 2015-11-06 ENCOUNTER — Other Ambulatory Visit: Payer: Self-pay

## 2016-01-17 ENCOUNTER — Ambulatory Visit: Payer: Medicare Other | Admitting: Cardiovascular Disease

## 2016-01-17 ENCOUNTER — Other Ambulatory Visit: Payer: Self-pay | Admitting: Cardiovascular Disease

## 2016-01-24 ENCOUNTER — Ambulatory Visit (INDEPENDENT_AMBULATORY_CARE_PROVIDER_SITE_OTHER): Payer: Medicare Other | Admitting: Cardiovascular Disease

## 2016-01-24 ENCOUNTER — Encounter: Payer: Self-pay | Admitting: Cardiovascular Disease

## 2016-01-24 VITALS — BP 160/100 | HR 64 | Ht 63.0 in | Wt 190.0 lb

## 2016-01-24 DIAGNOSIS — I10 Essential (primary) hypertension: Secondary | ICD-10-CM | POA: Diagnosis not present

## 2016-01-24 NOTE — Patient Instructions (Signed)

## 2016-01-24 NOTE — Progress Notes (Signed)
Cardiology Office Note   Date:  01/24/2016   ID:  [name], DOB 1945/11/04, MRN [id number]  PCP:  Velna Hatchet, MD  Cardiologist:   Mertie Moores, MD   Chief Complaint  Patient presents with  . Follow-up    PAF    Problem List 1. Possible paroxysmal atrial fib 2. Obstructive sleep apnea:   Having difficulty in getting a CPAP mask to fit.    History of Present Illness: Beth Haynes is a 70 y.o. female who presents for evaluation of possible atrial fib. She woke up with dizziness, room was spinning  Was seen by PA. Presented to the ER .  Head MRI was normal.  Symptoms sound like vertigo   She still has some occasional episodes of dizziness.   Has occasional episodes of palpitations ,  She thinks it may be related to panic attacks. No CP or dyspnea. Does not get any regular exercise.   [place] in [place]   Dec. 6, 2016:  Beth Haynes is seen today for follow up of her atrial fib.  BP is elevated today .   Ate some salty foods this weekend .   July 20, 2015:  Beth Haynes is seen back today .  Has PAF and OSA. She does not have a good CPAP mask that fits.   Dec. 13, 2017:  Was not able to find a CPAP that worked .   Could never get used to it  BP is elevated.  Has been eating more salt than usual - she and her husband eat popcorn every night.   Past Medical History:  Diagnosis Date  . IBS (irritable bowel syndrome)   . Obesity (BMI 30-39.9) 07/11/2015  . OSA (obstructive sleep apnea) 01/17/2015   Severe with AHI 35.8/hr  . Panic attacks     No past surgical history on file.   Current Outpatient Prescriptions  Medication Sig Dispense Refill  . ELIQUIS 5 MG TABS tablet TAKE 1 TABLET BY MOUTH TWO  TIMES DAILY 180 tablet 2  . losartan (COZAAR) 50 MG tablet Take 50 mg by mouth daily. Reported on 07/20/2015  5  . OVER THE COUNTER MEDICATION Take 1 tablet by mouth daily. AB Gard For IBS    . sertraline (ZOLOFT) 100 MG tablet Take 100 mg by mouth  daily.    . sertraline (ZOLOFT) 50 MG tablet Take 50 mg by mouth daily.    . VESICARE 10 MG tablet Take 10 mg by mouth daily.     No current facility-administered medications for this visit.     Allergies:   Sulfa antibiotics    Social History:  The patient  reports that she has never smoked. She has never used smokeless tobacco. She reports that she does not drink alcohol or use drugs.   Family History:  The patient's family history includes Heart attack in her father and paternal grandfather; Heart failure in her mother; Hypertension in her maternal grandmother.    ROS:  Please see the history of present illness.    Review of Systems: Constitutional:  denies fever, chills, diaphoresis, appetite change and fatigue.  HEENT: denies photophobia, eye pain, redness, hearing loss, ear pain, congestion, sore throat, rhinorrhea, sneezing, neck pain, neck stiffness and tinnitus.  Respiratory: denies SOB, DOE, cough, chest tightness, and wheezing.  Cardiovascular: admits to  , palpitations , denies leg swelling.  Gastrointestinal: denies nausea, vomiting, abdominal pain, diarrhea, constipation, blood in stool.  Genitourinary:  denies dysuria, urgency, frequency, hematuria, flank pain and difficulty urinating.  Musculoskeletal: denies  myalgias, back pain, joint swelling, arthralgias and gait problem.   Skin: denies pallor, rash and wound.  Neurological: admits to dizziness,  And vertigo symptoms   Hematological: denies adenopathy, easy bruising, personal or family bleeding history.  Psychiatric/ Behavioral: denies suicidal ideation, mood changes, confusion, nervousness, sleep disturbance and agitation.       All other systems are reviewed and negative.    PHYSICAL EXAM: VS:  BP (!) 160/100 (BP Location: Left Arm, Patient Position: Sitting, Cuff Size: Large)   Pulse 64   Ht 5\' 3"  (1.6 m)   Wt 190 lb (86.2 kg)   BMI 33.66 kg/m  , BMI Body mass index is 33.66 kg/m. GEN: Well  nourished, well developed, in no acute distress  HEENT: normal  Neck: no JVD, carotid bruits, or masses Cardiac: RRR; no murmurs, rubs, or gallops,no edema  Respiratory:  clear to auscultation bilaterally, normal work of breathing GI: soft, nontender, nondistended, + BS MS: no deformity or atrophy  Skin: warm and dry, no rash Neuro:  Strength and sensation are intact Psych: normal   EKG:  EKG is ordered today. The ekg ordered today demonstrates NSR at 64.  NS ST abn.   ECG from [facility] shows atrial fibrillation with a heart rate of 144.  Recent Labs: 03/13/2015: BUN 12; Creat 0.64; Hemoglobin 15.1; Platelets 221; Potassium 3.8; Sodium 141    Lipid Panel No results found for: CHOL, TRIG, HDL, CHOLHDL, VLDL, LDLCALC, LDLDIRECT    Wt Readings from Last 3 Encounters:  01/24/16 190 lb (86.2 kg)  07/20/15 193 lb 6.4 oz (87.7 kg)  07/11/15 190 lb 12.8 oz (86.5 kg)      Other studies Reviewed: Additional studies/ records that were reviewed today include: . Review of the above records demonstrates:    ASSESSMENT AND PLAN:  1.  Vertigo:   Further eval per her primary md.  2.  paroxysmal atrial fib:  CHADS2VASC is 2 ( age, female )  She does not want to be on a blood thinner  She has dizziness and falls frequently with these episodes.  .  Review of the ECG from [place] shows atrial fib She is tolerating the Eliquis  fairly well. She has normal left ventricle systolic function by echo. She has trace tricuspid regurgitation.   3. Obstructive sleep apnea: has had a sleep study that showed severe OSA.   Was not able get any benefit from CPAP.   Working with Dr. Radford Pax.    4. Essential HTN:   Dr. Ardeth Perfect gave her a script for Losartan.   She has not started it yet. She will follow up with him      Current medicines are reviewed at length with the patient today.  The patient does not have concerns regarding medicines.  The following changes have  been made:  no change  Labs/ tests ordered today include:  No orders of the defined types were placed in this encounter.    Disposition:   FU with me in 6 months .      Mertie Moores, MD  01/24/2016 11:56 AM    [place], [place], [place]  [address] Phone: [phone]; Fax: [phone]   [facility]  [address] [place], [place]  [address] [phone]   Fax [phone]

## 2016-03-16 ENCOUNTER — Telehealth: Payer: Self-pay | Admitting: Cardiology
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
## 2016-04-03 ENCOUNTER — Telehealth: Payer: Self-pay | Admitting: Cardiology

## 2016-04-03 NOTE — Telephone Encounter (Signed)
New message    Pt is calling to find out if she needs to schedule appt with Dr. Radford Pax in May. She states she no longer has the machine because she could never make it fit. She states the company told her she had to wait 5 years to re try.

## 2016-04-05 NOTE — Telephone Encounter (Signed)
[facility] could never get the mask to stay on to work even after trying several different sizes of mask. [facility] wanted the cpap machine back or they were going to start charging her for the machine. Patient wanted to try again but she was told by [facility] that she had to wait 5 years before trying again. The patient says she sleeps well at night and does not wake up and wants to know if she still needs an appt. to see you in May.

## 2016-04-07 NOTE — Telephone Encounter (Signed)
Please find out if she ever tried a nasal mask or nasal pillow mask

## 2016-04-15 ENCOUNTER — Telehealth: Payer: Self-pay | Admitting: *Deleted

## 2016-04-15 NOTE — Telephone Encounter (Signed)
[facility] states she got her machine April 28, 2015 and was  set up with[employer] fit F10). On May 17, 2015 she got re-fit with (Simplus Small mask) . On June 12,2017 she turned the mask and the machine back in and signed an AMA  these are the only mask she tried

## 2016-04-29 ENCOUNTER — Telehealth: Payer: Self-pay | Admitting: Cardiovascular Disease

## 2016-04-29 NOTE — Telephone Encounter (Signed)
Spoke with patient who states she was encouraged to call by her supervisor because he does not think she is getting enough sleep. She states he is very upset with her and wants her to get her issue with the CPAP equipment fixed today. I advised that I will forward message to Gershon Cull, CMA who is the sleep study coordinator for Dr. Radford Pax. I advised that I will notify her if there is going to be a delay to getting attention to this issue. She verbalized understanding and agreement and thanked me for the call.

## 2016-04-29 NOTE — Telephone Encounter (Signed)
Called [facility] and spoke to [facility] to see what needs to be done to get Beth Haynes back on her cpap machine. Melissa said since it has been a year and she turned in her machine against AMA that she will have to start all over again with a sleep study and a face to face visit with the doctor.

## 2016-04-29 NOTE — Telephone Encounter (Signed)
Patient calling states that she is not doing well, lack of oxygen. Please call, thanks.

## 2016-04-29 NOTE — Telephone Encounter (Signed)
Left message for patient that per Gershon Cull, CMA, patient will need to have another sleep study either at home or at [facility]. I advised her to call back to tell us how to proceed.

## 2016-04-30 ENCOUNTER — Telehealth: Payer: Self-pay | Admitting: Cardiovascular Disease

## 2016-05-02 NOTE — Telephone Encounter (Signed)
Follow Up:    Returning your call from yesterday.concerning sleep study.

## 2016-05-03 NOTE — Telephone Encounter (Signed)
Left message for patient to call back  

## 2016-05-06 NOTE — Telephone Encounter (Signed)
Referral for home sleep study sent to Gershon Cull, [place] for scheduling

## 2016-05-06 NOTE — Telephone Encounter (Signed)
Follow Up   Pt following up with with nurse regarding having a sleep study done. Requesting call back

## 2016-05-10 ENCOUNTER — Telehealth: Payer: Self-pay | Admitting: Cardiovascular Disease

## 2016-05-10 NOTE — Telephone Encounter (Signed)
Error

## 2016-05-16 ENCOUNTER — Telehealth: Payer: Self-pay | Admitting: *Deleted

## 2016-05-16 NOTE — Telephone Encounter (Signed)
Late entry:  Fax came today 05/15/16 from [facility] for the reason of cancellation of a home sleep test for the patient Beth Haynes. The patient refused the test stating she had scheduled with a different company. I spoke to the patient today and she states she has decided to go with dr Asencion Partridge Dohmeier at [facility] for her sleep and mental issues.

## 2016-06-11 ENCOUNTER — Telehealth: Payer: Self-pay

## 2016-06-11 NOTE — Telephone Encounter (Signed)
I called the pt to see if she would be willing to come for her 06/12/16 appt with Dr. Brett Fairy either at noon or 4pm instead of 8pm.  No answer, left a message asking her to call me back.

## 2016-06-12 ENCOUNTER — Ambulatory Visit (INDEPENDENT_AMBULATORY_CARE_PROVIDER_SITE_OTHER): Payer: Medicare Other | Admitting: Neurology

## 2016-06-12 ENCOUNTER — Encounter (INDEPENDENT_AMBULATORY_CARE_PROVIDER_SITE_OTHER): Payer: Self-pay

## 2016-06-12 ENCOUNTER — Encounter: Payer: Self-pay | Admitting: Neurology

## 2016-06-12 VITALS — BP 195/99 | HR 65 | Resp 20 | Ht 62.5 in | Wt 184.0 lb

## 2016-06-12 DIAGNOSIS — I480 Paroxysmal atrial fibrillation: Secondary | ICD-10-CM

## 2016-06-12 DIAGNOSIS — G4733 Obstructive sleep apnea (adult) (pediatric): Secondary | ICD-10-CM | POA: Diagnosis not present

## 2016-06-12 DIAGNOSIS — I340 Nonrheumatic mitral (valve) insufficiency: Secondary | ICD-10-CM

## 2016-06-12 NOTE — Telephone Encounter (Signed)
Dr. Brett Fairy was able to make the appt today so I did not need to cancel and r/s this pt after all.

## 2016-06-12 NOTE — Progress Notes (Signed)
SLEEP MEDICINE CLINIC   Provider:  Larey Seat, [place] D  Primary Care Physician:  Velna Hatchet, MD   Referring Provider: Velna Hatchet, MD   Chief Complaint  Patient presents with  . New Patient (Initial Visit)    has had a sleep study, tried the cpap for a while    HPI:  Beth Haynes is a 71 y.o. female , seen here as in a referral from Dr. Ardeth Perfect for a new sleep apnea evaluation.   Chief complaint according to patient : "I was diagnosed with apnea but could not get CPAP to work right".   Mrs. Beth Haynes husband is also a patient of my sleep practice, she was referred by her cardiologist, Dr. Cathie Olden, for sleep evaluation to the [facility] sleep lab. She reports that atrial fibrillation was her main risk factor. In addition she had hypertension and a body mass index exceeding 30. Atrial fibrillation has been persistent, she is chronically anticoagulated on Eloquis. The patient reportedly has a cardiac valve regurgitation, diagnosed by echocardiogram but I do not have the original diagnostic tests available.The patient also has a history of hyperlipidemia, panic attacks,, irritable bowel syndrome, hypertension, urinary incontinence.   Sleep habits are as follows: The patient likes to read and she goes to bed but she falls asleep very promptly and feels that she can sleep uninterrupted through the night. She remembers vivid, beautiful dreams not nightmarish in character but colorful and very interesting.  h she shares the bedroom with her husband , who reports her to snore very, very loudly. He is hard of hearing and not normally bothered.  She usually retreats to her bedroom by about 10 PM starts to read and is asleep within 15-30 minutes. She prefers to sleep on her back and on her side. She sleeps on 2 thin, firm pillows. Her husband reports that she seems to be gasping for air and still snoring while using CPAP.  Sleep medical history and family sleep history: Mrs. Tigges  maternal grandmother died at age 17 of a sudden cardiac death, her mother collapsed and fell hitting her had never regaining consciousness died at age 58. Mrs. Karapetyan has undergone a tonsillectomy in 1963, she had frequent falls and broken both ankles, left wrist and left collarbone. Social history: married, retired, does not drink caffeine, lifelong non smoker, ETOH seldomly.   Her cholesterol and her last labs from April was 188 total, triglycerides 101, HDL was 44, creatinine was 0.7 liver function tests only marginal elevation of ALT at 41 units per liter was noted. She is mildly leukopenic at 3.97K, but she does have no evidence of anemia. TSH was in normal limits.  I reviewed the patient's previous to sleep studies she was admitted for a split night polysomnography on 01/11/2015. The patient had frequent hypopneas and apneas the RDI was 56.7 which is very high. She did not have Cheyne-Stokes breathing of central apneas. She seemed to have slept all night in supine sleep and therefore all her apneas and hypotony as a clerk in supine sleep. She was titrated to CPAP and is establishing a high pressure of 19 cm water and her AHI did not respond it remained in the 2 digits. Her AHI was 65.5 on CPAP of 19 cm water she was changed to bilevel and remained with an AHI in the 60s under 23/19 cm water she was asked back for full night BiPAP titration on 04/11/2015 and her RDI exacerbated to 44.5;  this same  procedure of a bilevel titration again to a pressure of 19/15 cm water resulted in a residual AHI of 26.7.  Oxygen nadir was 90% saturation.  Overall,  the patient barely slept and she continued to have difficulties using the machine at home. An optimal pressure was never identified Review of Systems:  Out of a complete 14 system review, the patient complains of only the following symptoms, and all other reviewed systems are negative. snoring Hypersomnia, excessive sleepiness.  Epworth score 21 with vivid  dreams.  , Fatigue severity score 46  , depression score 5/15 on treatment.    Social History   Social History  . Marital status: Married    Spouse name: N/A  . Number of children: N/A  . Years of education: N/A   Occupational History  . Not on file.   Social History Main Topics  . Smoking status: Never Smoker  . Smokeless tobacco: Never Used  . Alcohol use No  . Drug use: No  . Sexual activity: Not on file   Other Topics Concern  . Not on file   Social History Narrative  . No narrative on file    Family History  Problem Relation Age of Onset  . Heart failure Mother   . Heart attack Father   . Hypertension Maternal Grandmother   . Heart attack Paternal Grandfather     Past Medical History:  Diagnosis Date  . IBS (irritable bowel syndrome)   . Obesity (BMI 30-39.9) 07/11/2015  . OSA (obstructive sleep apnea) 01/17/2015   Severe with AHI 35.8/hr  . Panic attacks     No past surgical history on file.  Current Outpatient Prescriptions  Medication Sig Dispense Refill  . ELIQUIS 5 MG TABS tablet TAKE 1 TABLET BY MOUTH TWO  TIMES DAILY 180 tablet 2  . losartan (COZAAR) 50 MG tablet Take 50 mg by mouth daily. Reported on 07/20/2015  5  . OVER THE COUNTER MEDICATION Take 1 tablet by mouth daily. AB Gard For IBS    . sertraline (ZOLOFT) 100 MG tablet Take 100 mg by mouth daily.    . sertraline (ZOLOFT) 50 MG tablet Take 50 mg by mouth daily.    . VESICARE 10 MG tablet Take 10 mg by mouth daily.     No current facility-administered medications for this visit.     Allergies as of 06/12/2016 - Review Complete 06/12/2016  Allergen Reaction Noted  . Epinephrine  06/12/2016  . Sulfa antibiotics  08/24/2014    Vitals: BP (!) 195/99   Pulse 65   Resp 20   Ht 5' 2.5" (1.588 m)   Wt 184 lb (83.5 kg)   BMI 33.12 kg/m  Last Weight:  Wt Readings from Last 1 Encounters:  06/12/16 184 lb (83.5 kg)   LYY:TKPT mass index is 33.12 kg/m.     Last Height:   Ht Readings  from Last 1 Encounters:  06/12/16 5' 2.5" (1.588 m)    Physical exam:  General: The patient is awake, alert and appears not in acute distress. The patient is well groomed. Head: Normocephalic, atraumatic. Neck is supple. Mallampati 2  neck circumference:15. Nasal airflow congested, but patent,  TMJ click is not evident . Retrognathia is not seen.  Cardiovascular: irregular rate and rhythm, without  murmurs or carotid bruit, and without distended neck veins. Respiratory: Lungs are clear to auscultation. Skin:  Without evidence of edema, or rash Trunk: BMI is elevated . The patient's posture is erect.  Neurologic exam :  The patient is awake and alert, oriented to place and time.   Memory subjective described as intact.  Attention span & concentration ability appears normal.  Speech is fluent,  without dysarthria, dysphonia or aphasia.  Mood and affect are appropriate.  Cranial nerves: Pupils are equal and briskly reactive to light. Funduscopic exam without evidence of pallor or edema. Extraocular movements  in vertical and horizontal planes intact and without nystagmus. Visual fields by finger perimetry are intact. Hearing to finger rub intact. Facial sensation intact to fine touch. Facial motor strength is symmetric and tongue and uvula move midline. Shoulder shrug was symmetrical.   Motor exam:   Normal tone, muscle bulk and symmetric strength in all extremities. Sensory:  Fine touch, pinprick and vibration were tested in all extremities. Proprioception tested in the upper extremities was normal. Coordination: Rapid alternating movements in the fingers/hands was normal. Finger-to-nose maneuver normal without evidence of ataxia, dysmetria or tremor.  Gait and station: Patient walks without assistive device and is able unassisted to climb up to the exam table. Strength within normal limits.  Stance is stable and normal.  Deep tendon reflexes: in the  upper and lower extremities are  brisk, mostly on the left -intact. Babinski maneuver response is downgoing.    Assessment:  After physical and neurologic examination, review of laboratory studies,  Personal review of imaging studies, reports of other /same  Imaging studies, results of polysomnography and / or neurophysiology testing and pre-existing records as far as provided in visit., my assessment is   1) in summary, Mrs. Odeh has several risk factors and obviously has been diagnosed with severe sleep apnea but she could not respond to CPAP nor BiPAP. The fact or she slept less than 80% of the recorded time in either sleep study and she reported when she tried to use a BiPAP machine at home she could not sleep through the night. But she acknowledges excessive daytime sleepiness and obviously endorsed the Epworth sleepiness score at 21 points she also felt that she could only sleep when the machine was not in place. She had dealings with her durable medical equipment company, at the time advanced home care and return to the BiPAP machine after 3 weeks so that she would not be charged for machine she couldn't use. She is here to see if he can find a different treatment modalities for her and given the degree of her apnea I would think that positive airway therapy is the only way to elevate oxygen levels and prevent paroxysmal atrial fibrillation at night. It reduces her stroke risk. It does cause not replace anticoagulation therapy.  I will  invite Mrs. Perry for an attended sleep study, for which we have developed a special protocol. It is a so-called "gentle titration " and the only allows to advance pressure after 30 minutes of sleep. She should be the first patient to arrive at the sleep lab that night for that she has enough time until 6 in the morning and if she also fails BiPAP I would place her on a so-called ASV cervical ventilation machine. We also will discuss with her ways to reduce her apprehension, preventing her from having  panic attacks at night. This can be achieved with medication but also this desensitization, allowing her to use different interfaces. I am not opposed to use just a nasal pillow for patient that is new to positive airway pressure therapy;  Once she has established a therapy using positive airway pressure, she can  make changes.  The patient would like to follow Linzie Collin, at [place].     The patient was advised of the nature of the diagnosed disorder , the treatment options and the  risks for general health and wellness arising from not treating the condition.   I spent more than 50 minutes of face to face time with the patient.  Greater than 50% of time was spent in counseling and coordination of care. We have discussed the diagnosis and differential and I answered the patient's questions.    Plan:  Treatment plan and additional workup :  full night PAP therapy- allow for change to ASV if she cannot use BiPAP.      Larey Seat, MD 07/19/7371, 6:68 AM  Certified in Neurology by ABPN Certified in [place] by [facility] [address] [place], [place] [address]

## 2016-06-25 ENCOUNTER — Ambulatory Visit (INDEPENDENT_AMBULATORY_CARE_PROVIDER_SITE_OTHER): Payer: Medicare Other | Admitting: Neurology

## 2016-06-25 DIAGNOSIS — G4731 Primary central sleep apnea: Principal | ICD-10-CM

## 2016-06-25 DIAGNOSIS — I480 Paroxysmal atrial fibrillation: Secondary | ICD-10-CM

## 2016-06-25 DIAGNOSIS — G4739 Other sleep apnea: Secondary | ICD-10-CM

## 2016-06-25 DIAGNOSIS — G4737 Central sleep apnea in conditions classified elsewhere: Secondary | ICD-10-CM

## 2016-06-25 DIAGNOSIS — G4733 Obstructive sleep apnea (adult) (pediatric): Secondary | ICD-10-CM

## 2016-06-25 DIAGNOSIS — I340 Nonrheumatic mitral (valve) insufficiency: Secondary | ICD-10-CM

## 2016-06-27 ENCOUNTER — Other Ambulatory Visit: Payer: Self-pay | Admitting: Internal Medicine

## 2016-06-27 DIAGNOSIS — Z1231 Encounter for screening mammogram for malignant neoplasm of breast: Secondary | ICD-10-CM

## 2016-07-02 ENCOUNTER — Telehealth: Payer: Self-pay | Admitting: Neurology

## 2016-07-02 NOTE — Telephone Encounter (Signed)
Pt returned RN's call. Msg relayed below, pt understood

## 2016-07-02 NOTE — Telephone Encounter (Signed)
I called pt to discuss. No answer, left a message asking her to call me back.  Sleep study results take 10-14 days to receive. I have not received this pt's sleep study results from Dr. Brett Fairy. I will call the pt when I have the results.

## 2016-07-02 NOTE — Telephone Encounter (Signed)
Pt wants to know what the next step is now that sleep study is done. Best call back [phone]

## 2016-07-03 NOTE — Addendum Note (Signed)
Addended by: Larey Seat on: 07/03/2016 06:32 PM   Modules accepted: Orders

## 2016-07-03 NOTE — Procedures (Signed)
PATIENT'S NAME:  Beth Haynes, Beth Haynes DOB:      06/10/1898      MR#:    [id number]     DATE OF RECORDING: 06/25/2016 REFERRING M.D.:  Velna Hatchet, MD Study Performed:    PAP Titration, ASV  HISTORY: Mrs. Mcclard is a 71 year old  female patient and reports :"I was diagnosed with apnea but could not get CPAP to work right".  The patient has been CPAP intolerant and returns for a BiPAP/ ASV protocol.  She reports excessive daytime sleepiness. In addition she has hypertension and a body mass index exceeding 30. Atrial fibrillation has been persistent; she is chronically anticoagulated on Eliquis. The patient reported cardiac valve regurgitation, diagnosed by echocardiogram; but I do not have the original diagnostic tests available. The patient also has a history of hyperlipidemia, panic attacks, irritable bowel syndrome, hypertension, urinary incontinence.  The patient endorsed the Epworth Sleepiness Scale at 21/ 24 points. The patient's weight 184 pounds with a height of 62.5 (inches), resulting in a BMI of 33.7 kg/m2. The patient's neck circumference measured 15 inches.  CURRENT MEDICATIONS: Eliquis, Cozaar, Zoloft, Vesicare  PROCEDURE:  This is a multichannel digital polysomnogram utilizing the SomnoStar 11.2 system.  Electrodes and sensors were applied and monitored per AASM Specifications.   EEG, EOG, Chin and Limb EMG, were sampled at 200 Hz.  ECG, Snore and Nasal Pressure, Thermal Airflow, Respiratory Effort, CPAP Flow and Pressure, Oximetry was sampled at 50 Hz. Digital video and audio were recorded.      BiPAP was initiated at 7/4 cmH20 with heated humidity per AASM split night standards and pressure was advanced to 12/8 cmH20 because of hypopneas, apneas and desaturations.  The patient responded best to BiPAP at 8/5 cm with an AHI of 5.9 - and 95 % sleep efficiency, nadir was 88%.  The technologist changed to [employer] SV at 20/ 5/ 3 cm water  - the patient responded with an AHI of 0.3 and 86% sleep  efficiency.    Lights Out was at 21:09 and Lights On at 05:21. Total recording time (TRT) was 493 minutes, with a total sleep time (TST) of 403.5 minutes. The patient's sleep latency was 40.5 minutes with 1 minutes of wake time after sleep onset. REM latency was 58 minutes.  The sleep efficiency was 81.8 %.    SLEEP ARCHITECTURE: WASO (Wake after sleep onset) was 48 minutes.  There were 9 minutes in Stage N1, 153 minutes Stage N2, 123.5 minutes Stage N3 and 118 minutes in Stage REM.  The percentage of Stage N1 was 2.2%, Stage N2 was 37.9%, Stage N3 was 30.6% and Stage R (REM sleep) was 29.2%. The sleep architecture was notable for REM rebound.  RESPIRATORY ANALYSIS:  There was a total of 64 respiratory events: 6 obstructive apneas, 54 central apneas and 0 mixed apneas with a total of 60 apneas and an apnea index (AI) of 8.9 /hour. There were 4 hypopneas with a hypopnea index of 0.6/hour. The patient also had 0 respiratory event related arousals (RERAs).  The total APNEA/HYPOPNEA INDEX  (AHI) was 9.5 /hour and the total RESPIRATORY DISTURBANCE INDEX was 9.5 .hour  0 events occurred in REM sleep and 64 events in NREM. The REM AHI was 0 /hour versus a non-REM AHI of 13.5 /hour.  The patient spent 186 minutes of total sleep time in the supine position and 218 minutes in non-supine. The supine AHI was 1.0, versus a non-supine AHI of 16.8.  OXYGEN SATURATION & C02:  The baseline 02 saturation was 96%, with the lowest being 87%. Time spent below 89% saturation equaled 2 minutes.  PERIODIC LIMB MOVEMENTS:    The patient had a total of 6 Periodic Limb Movements. The Periodic Limb Movement (PLM) index was 0.9 . The arousals were noted as: 93 were spontaneous, 0 were associated with PLMs, and 36 were associated with respiratory events.  Audio and video analysis did not show any abnormal or unusual movements, behaviors, phonations or vocalizations.   The patient did not take bathroom breaks. No snoring was  noted. EKG was in keeping with normal sinus rhythm (NSR), P waves were seen.    DIAGNOSIS Central Sleep Apnea, not responsive to CPAP, not to BiPAP - but to ASV - EPAP 5 cm, minimum pressure support of 3 and maximum of 15 cm water. The patient was fitted with an ESON nasal mask by [employer] in medium size.   DISCUSSION: A follow up appointment will be scheduled in the Sleep Clinic at [facility].   Please call [phone] with any questions.      I certify that I have reviewed the entire raw data recording prior to the issuance of this report in accordance with the Standards of Accreditation of the American Academy of Sleep Medicine (AASM)      Larey Seat, M.D.  07-03-2016  Diplomat, American Board of Psychiatry and Neurology  Diplomat, [place] [facility] [employer] [facility] Medical Director, [place] [facility] at [facility]

## 2016-07-04 ENCOUNTER — Telehealth: Payer: Self-pay

## 2016-07-04 NOTE — Telephone Encounter (Signed)
-----   Message from Larey Seat, MD sent at 07/03/2016  6:32 PM EDT ----- DIAGNOSIS Central Sleep Apnea, not responsive to CPAP, not to BiPAP - but to ASV . Treatment emergent central apnea was not seen during REM sleep.  RESULT : - EPAP 5 cm, minimum pressure support of 3 and maximum of 15 cm water. The patient was fitted with an ESON nasal mask by [employer] in medium size.   DISCUSSION: A follow up appointment will be scheduled in the Sleep Clinic at [facility].   Please call [phone] with any questions.

## 2016-07-04 NOTE — Telephone Encounter (Signed)
I called pt to discuss sleep study results. No answer, left a message asking her to call me back. 

## 2016-07-09 NOTE — Telephone Encounter (Signed)
I called pt. I advised pt that Dr. Brett Fairy reviewed their sleep study results and found that pt did well with ASV, and that treatment emergent central apnea was not seen during REM sleep. Dr. Brett Fairy recommends that pt start an ASV at home. I reviewed PAP compliance expectations with the pt. Pt is agreeable to starting an ASV. I advised pt that an order will be sent to a DME, [facility], and [facility] will call the pt within about one week after they file with the pt's insurance. [facility] will show the pt how to use the machine, fit for masks, and troubleshoot the ASV if needed. A follow up appt was made for insurance purposes with Dr. Brett Fairy on Wednesday, September 19th, 2018 at 10:30am. Pt verbalized understanding to arrive 15 minutes early and bring their ASV. A letter with all of this information in it will be mailed to the pt as a reminder. I verified with the pt that the address we have on file is correct. Pt verbalized understanding of results. Pt had no questions at this time but was encouraged to call back if questions arise.

## 2016-07-13 ENCOUNTER — Emergency Department (HOSPITAL_BASED_OUTPATIENT_CLINIC_OR_DEPARTMENT_OTHER)
Admission: EM | Admit: 2016-07-13 | Discharge: 2016-07-13 | Disposition: A | Discharge status: Home / Self Care | Payer: Medicare Other | Attending: Emergency Medicine | Admitting: Emergency Medicine

## 2016-07-13 ENCOUNTER — Encounter (HOSPITAL_BASED_OUTPATIENT_CLINIC_OR_DEPARTMENT_OTHER): Payer: Self-pay

## 2016-07-13 DIAGNOSIS — R531 Weakness: Secondary | ICD-10-CM | POA: Diagnosis present

## 2016-07-13 DIAGNOSIS — Z79899 Other long term (current) drug therapy: Secondary | ICD-10-CM | POA: Diagnosis not present

## 2016-07-13 DIAGNOSIS — I10 Essential (primary) hypertension: Secondary | ICD-10-CM | POA: Diagnosis not present

## 2016-07-13 HISTORY — DX: Unspecified atrial fibrillation: I48.91

## 2016-07-13 HISTORY — DX: Nocturnal enuresis: N39.44

## 2016-07-13 LAB — URINALYSIS, ROUTINE W REFLEX MICROSCOPIC
Bilirubin Urine: NEGATIVE
GLUCOSE, UA: NEGATIVE mg/dL
Hgb urine dipstick: NEGATIVE
Ketones, ur: NEGATIVE mg/dL
Nitrite: NEGATIVE
PH: 7 (ref 5.0–8.0)
Protein, ur: NEGATIVE mg/dL
Specific Gravity, Urine: 1.005 (ref 1.005–1.030)

## 2016-07-13 LAB — URINALYSIS, MICROSCOPIC (REFLEX)

## 2016-07-13 LAB — CBC
HCT: 45 % (ref 36.0–46.0)
Hemoglobin: 15.1 g/dL — ABNORMAL HIGH (ref 12.0–15.0)
MCH: 29.1 pg (ref 26.0–34.0)
MCHC: 33.6 g/dL (ref 30.0–36.0)
MCV: 86.7 fL (ref 78.0–100.0)
PLATELETS: 173 10*3/uL (ref 150–400)
RBC: 5.19 MIL/uL — ABNORMAL HIGH (ref 3.87–5.11)
RDW: 14.9 % (ref 11.5–15.5)
WBC: 4 10*3/uL (ref 4.0–10.5)

## 2016-07-13 LAB — BASIC METABOLIC PANEL
Anion gap: 6 (ref 5–15)
BUN: 13 mg/dL (ref 6–20)
CALCIUM: 9.6 mg/dL (ref 8.9–10.3)
CO2: 30 mmol/L (ref 22–32)
Chloride: 107 mmol/L (ref 101–111)
Creatinine, Ser: 0.75 mg/dL (ref 0.44–1.00)
GFR calc Af Amer: >60 mL/min (ref >60)
GLUCOSE: 86 mg/dL (ref 65–99)
POTASSIUM: 3.6 mmol/L (ref 3.5–5.1)
Sodium: 143 mmol/L (ref 135–145)

## 2016-07-13 MED ORDER — CEPHALEXIN 500 MG PO CAPS: 500.0000 mg | ORAL_CAPSULE | Freq: Three times a day (TID) | ORAL | 0 refills | Status: DC

## 2016-07-13 NOTE — Discharge Instructions (Signed)
Return to the ED with any concerns including fever/chills, vomiting and not able to keep down liquids, decreased level of alertness/lethargy, or any other alarming symptoms

## 2016-07-13 NOTE — ED Triage Notes (Addendum)
Pt reports multiple tick bites over the past 4-5 weeks, reports nausea x2-3 days, states she feels "sick all over" and that these symptoms mimic her first episode of a-fib. Reports generalized weakness. Reports she feels like her jaw is locked.

## 2016-07-13 NOTE — ED Provider Notes (Signed)
[facility] DEPT [facility] Provider Note   CSN: [id number] Arrival date & time: 07/13/16  1054     History   Chief Complaint Chief Complaint  Patient presents with  . Weakness    HPI GERLINE RATTO is a 71 y.o. female.  HPI  Pt with hx of afib, OSA, presenting with c/o fatigue, generalized weakness, not being able to sleep well at night- she states these symptoms have been ongoing for the past several weeks.  She had a sleep study approx 2 weeks ago and is being set up for a sleep machine at home which she has not gotten yet.  No chest pain, no fever/chills.  No palpitations or difficulty breathing.  She takes eliquis for afib.  No focal weakness no changes in vision or speech.  She states she has also seen several ticks on herself that she has removed- from walking her dog in a wooded area- no joint pains or rashes of fevers.  There are no other associated systemic symptoms, there are no other alleviating or modifying factors.   Past Medical History:  Diagnosis Date  . Atrial fibrillation ([place])   . IBS (irritable bowel syndrome)   . Obesity (BMI 30-39.9) 07/11/2015  . OSA (obstructive sleep apnea) 01/17/2015   Severe with AHI 35.8/hr  . Panic attacks   . Urinary incontinence, nocturnal enuresis     Patient Active Problem List   Diagnosis Date Noted  . Obstructive sleep apnea hypopnea, severe 06/12/2016  . Obesity (BMI 30-39.9) 07/11/2015  . OSA (obstructive sleep apnea) 01/17/2015  . HTN (hypertension) 01/17/2015  . Atrial fibrillation ([place]) 11/07/2014    Past Surgical History:  Procedure Laterality Date  . TONSILLECTOMY      OB History    No data available       Home Medications    Prior to Admission medications   Medication Sig Start Date End Date Taking? Authorizing Provider  ELIQUIS 5 MG TABS tablet TAKE 1 TABLET BY MOUTH TWO  TIMES DAILY 01/17/16  Yes Nahser, Wonda Cheng, MD  losartan (COZAAR) 50 MG tablet Take 50 mg by mouth daily. Reported on 07/20/2015 07/03/15  Yes  [provider]  sertraline (ZOLOFT) 100 MG tablet Take 100 mg by mouth daily.   Yes [provider]  sertraline (ZOLOFT) 50 MG tablet Take 50 mg by mouth daily.   Yes [provider]  VESICARE 10 MG tablet Take 10 mg by mouth daily. 01/10/16  Yes [provider]  cephALEXin (KEFLEX) 500 MG capsule Take 1 capsule (500 mg total) by mouth 3 (three) times daily. 07/13/16   Alfonzo Beers, MD  OVER THE COUNTER MEDICATION Take 1 tablet by mouth daily. Sharman Cheek For IBS    [provider]    Family History Family History  Problem Relation Age of Onset  . Heart failure Mother   . Heart attack Father   . Hypertension Maternal Grandmother   . Heart attack Paternal Grandfather     Social History Social History  Substance Use Topics  . Smoking status: Never Smoker  . Smokeless tobacco: Never Used  . Alcohol use No     Allergies   Epinephrine and Sulfa antibiotics   Review of Systems Review of Systems  ROS reviewed and all otherwise negative except for mentioned in HPI   Physical Exam Updated Vital Signs BP (!) 158/99 (BP Location: Left Arm)   Pulse 66   Temp 98.8 F (37.1 C) (Oral)   Resp 14  Ht 5\' 3"  (1.6 m)   Wt 83.5 kg (184 lb)   SpO2 98%   BMI 32.59 kg/m  Vitals reviewed Physical Exam Physical Examination: General appearance - alert, well appearing, and in no distress Mental status - alert, oriented to person, place, and time Eyes -PERRL, EOMI Mouth - mucous membranes moist, pharynx normal without lesions Neck - supple, no significant adenopathy Chest - clear to auscultation, no wheezes, rales or rhonchi, symmetric air entry Heart - normal rate, regular rhythm, normal S1, S2, no murmurs, rubs, clicks or gallops Neurological - alert, oriented x 3, normal speech, moving all extremities, sensation intact Extremities - peripheral pulses normal, no pedal edema, no clubbing or cyanosis Skin - normal coloration and turgor, no  rashes  ED Treatments / Results  Labs (all labs ordered are listed, but only abnormal results are displayed) Labs Reviewed  CBC - Abnormal; Notable for the following:       Result Value   RBC 5.19 (*)    Hemoglobin 15.1 (*)    All other components within normal limits  URINALYSIS, ROUTINE W REFLEX MICROSCOPIC - Abnormal; Notable for the following:    Leukocytes, UA MODERATE (*)    All other components within normal limits  URINALYSIS, MICROSCOPIC (REFLEX) - Abnormal; Notable for the following:    Bacteria, UA MANY (*)    Squamous Epithelial / LPF 0-5 (*)    All other components within normal limits  URINE CULTURE  BASIC METABOLIC PANEL  ROCKY MTN SPOTTED FVR ABS PNL(IGG+IGM)  B. BURGDORFI ANTIBODIES    EKG  EKG Interpretation  Date/Time:  Saturday July 13 2016 11:14:03 EDT Ventricular Rate:  71 PR Interval:    QRS Duration: 96 QT Interval:  410 QTC Calculation: 446 R Axis:   47 Text Interpretation:  Sinus rhythm Borderline low voltage, extremity leads No significant change since last tracing Confirmed by Alfonzo Beers [phone]) on 07/13/2016 12:07:50 PM       Radiology No results found.  Procedures Procedures (including critical care time)  Medications Ordered in ED Medications - No data to display   Initial Impression / Assessment and Plan / ED Course  I have reviewed the triage vital signs and the nursing notes.  Pertinent labs & imaging results that were available during my care of the patient were reviewed by me and considered in my medical decision making (see chart for details).     Pt presenting with c/o generalized fatigue over the past several weeks to months.  She had sleep study and is having arrangements made for machine at home.  No fevers, no chest pain.  Doubt sepsis, doubt ACS, labs are reassuring, pt not anemic, no signficant metabolic abnormality.  EKG reassuring as well.  Pt has contaminated specimen with squamous cells in urine but many  bacteria and 6-30 WBC- as this could be the cause of her malaise will cover with keflex- also sending urine culture.  Pt advised to f/u with her PMD.  Discharged with strict return precautions.  Pt agreeable with plan.  Final Clinical Impressions(s) / ED Diagnoses   Final diagnoses:  Generalized weakness    New Prescriptions Discharge Medication List as of 07/13/2016  1:11 PM    START taking these medications   Details  cephALEXin (KEFLEX) 500 MG capsule Take 1 capsule (500 mg total) by mouth 3 (three) times daily., Starting Sat 07/13/2016, Print         Alfonzo Beers, MD 07/13/16 1356

## 2016-07-14 LAB — URINE CULTURE

## 2016-07-15 LAB — B. BURGDORFI ANTIBODIES

## 2016-07-16 LAB — RMSF, IGG, IFA: RMSF, IGG, IFA: <1:64 {titer}

## 2016-07-16 LAB — ROCKY MTN SPOTTED FVR ABS PNL(IGG+IGM)
RMSF IGG: POSITIVE — AB
RMSF IGM: 0.29 {index} (ref 0.00–0.89)

## 2016-07-17 ENCOUNTER — Ambulatory Visit
Admission: RE | Admit: 2016-07-17 | Discharge: 2016-07-17 | Disposition: A | Discharge status: Home / Self Care | Payer: Medicare Other | Source: Ambulatory Visit | Attending: Internal Medicine | Admitting: Internal Medicine

## 2016-07-17 DIAGNOSIS — Z1231 Encounter for screening mammogram for malignant neoplasm of breast: Secondary | ICD-10-CM

## 2016-07-29 ENCOUNTER — Encounter: Payer: Self-pay | Admitting: Physician Assistant

## 2016-07-29 ENCOUNTER — Ambulatory Visit (INDEPENDENT_AMBULATORY_CARE_PROVIDER_SITE_OTHER): Payer: Medicare Other | Admitting: Physician Assistant

## 2016-07-29 VITALS — BP 138/78 | HR 68 | Ht 62.5 in | Wt 181.4 lb

## 2016-07-29 DIAGNOSIS — I10 Essential (primary) hypertension: Secondary | ICD-10-CM | POA: Diagnosis not present

## 2016-07-29 DIAGNOSIS — I480 Paroxysmal atrial fibrillation: Secondary | ICD-10-CM

## 2016-07-29 NOTE — Progress Notes (Signed)
Cardiology Office Note:    Date:  07/29/2016   ID:  Beth Haynes, DOB [birth year], MRN [id number]  PCP:  Velna Hatchet, MD  Cardiologist:  Dr. Liam Rogers   Sleep:  Dr. Brett Fairy  Referring MD: Velna Hatchet, MD   Chief Complaint  Patient presents with  . Atrial Fibrillation    follow up    History of Present Illness:    Beth Haynes is a 72 y.o. female with a hx of Paroxysmal atrial fibrillation, HTN, OSA.  CHADS2-VASc=3 (female, 71 yo, HTN). She is on Apixaban for anticoagulation.  Last seen by Dr. Acie Fredrickson 12/17.   Beth Haynes returns for Cardiology follow up.  She is doing well.  She went to the ED recently with fatigue.  RMSF antibody was positive and she was put on Doxycycline.  She had diarrhea with this and stopped it.  Of note her antibody titer returned negative.  I asked her to follow up with her PCP to discuss this.  She has not had chest pain, shortness of breath, syncope, paroxysmal nocturnal dyspnea, edema.  She has not had any bleeding issues other than bruising.    Prior CV studies:   The following studies were reviewed today:  Echo 12/07/14 Moderate concentric LVH, EF 60-65, normal wall motion, grade 2 diastolic dysfunction, MAC, mild RAE, PASP 36  Event monitor 9/16 NSR, paroxysmal atrial fibrillation  Past Medical History:  Diagnosis Date  . Atrial fibrillation ([place])   . IBS (irritable bowel syndrome)   . Obesity (BMI 30-39.9) 07/11/2015  . OSA (obstructive sleep apnea) 01/17/2015   Severe with AHI 35.8/hr  . Panic attacks   . Urinary incontinence, nocturnal enuresis     Past Surgical History:  Procedure Laterality Date  . TONSILLECTOMY      Current Medications: Current Meds  Medication Sig  . ELIQUIS 5 MG TABS tablet TAKE 1 TABLET BY MOUTH TWO  TIMES DAILY  . losartan (COZAAR) 50 MG tablet Take 50 mg by mouth daily. Reported on 07/20/2015  . sertraline (ZOLOFT) 100 MG tablet Take 100 mg by mouth daily.  . sertraline (ZOLOFT) 50 MG tablet Take 50 mg by  mouth daily.     Allergies:   Epinephrine and Sulfa antibiotics   Social History   Social History  . Marital status: Married    Spouse name: N/A  . Number of children: N/A  . Years of education: N/A   Social History Main Topics  . Smoking status: Never Smoker  . Smokeless tobacco: Never Used  . Alcohol use No  . Drug use: No  . Sexual activity: Not Asked   Other Topics Concern  . None   Social History Narrative  . None     Family Hx: The patient's family history includes Breast cancer in her maternal aunt; Heart attack in her father and paternal grandfather; Heart failure in her mother; Hypertension in her maternal grandmother.  ROS:   Please see the history of present illness.    ROS All other systems reviewed and are negative.   EKGs/Labs/Other Test Reviewed:    EKG:  EKG is not ordered today.  The ekg ordered today demonstrates n/a  Recent Labs: 07/13/2016: BUN 13; Creatinine, Ser 0.75; Hemoglobin 15.1; Platelets 173; Potassium 3.6; Sodium 143   Recent Lipid Panel No results found for: CHOL, TRIG, HDL, CHOLHDL, LDLCALC, LDLDIRECT  Physical Exam:    VS:  BP 138/78   Pulse 68   Ht 5' 2.5" (1.588 m)  Wt 181 lb 6.4 oz (82.3 kg)   SpO2 98%   BMI 32.65 kg/m     Wt Readings from Last 3 Encounters:  07/29/16 181 lb 6.4 oz (82.3 kg)  07/13/16 184 lb (83.5 kg)  06/12/16 184 lb (83.5 kg)     Physical Exam  Constitutional: She is oriented to person, place, and time. She appears well-developed and well-nourished. No distress.  HENT:  Head: Normocephalic and atraumatic.  Eyes: No scleral icterus.  Neck: Normal range of motion. No JVD present.  Cardiovascular: Normal rate, regular rhythm, S1 normal, S2 normal and normal heart sounds.   No murmur heard. Pulmonary/Chest: Breath sounds normal. She has no wheezes. She has no rhonchi. She has no rales.  Abdominal: Soft. There is no tenderness.  Musculoskeletal: She exhibits no edema.  Neurological: She is alert  and oriented to person, place, and time.  Skin: Skin is warm and dry.  Psychiatric: She has a normal mood and affect.    ASSESSMENT:    1. Paroxysmal atrial fibrillation (HCC)   2. Essential hypertension    PLAN:    In order of problems listed above:  1. Paroxysmal atrial fibrillation (HCC) - Maintaining NSR.  Recent CBC and BMET ok. Continue Eliquis.  Continue FU with Neuro for OSA.  FU with me or Dr. Liam Rogers in 6 mos.   2. Essential hypertension - The patient's blood pressure is controlled on her current regimen.  Continue current therapy.  She has lost 9 lbs.  I have congratulated her today on her weight loss.   Dispo:  Return in about 6 months (around 01/28/2017) for Routine Follow Up, w/ Dr. Acie Fredrickson, or Richardson Dopp, PA-C.   Medication Adjustments/Labs and Tests Ordered: Current medicines are reviewed at length with the patient today.  Concerns regarding medicines are outlined above.  Orders/Tests:  No orders of the defined types were placed in this encounter.  Medication changes: No orders of the defined types were placed in this encounter.  Signed, Richardson Dopp, PA-C  07/29/2016 11:01 AM    [facility] [address], [place], [place]  [address] Phone: [phone]; Fax: [phone]

## 2016-07-29 NOTE — Patient Instructions (Signed)
Medication Instructions:  Your physician recommends that you continue on your current medications as directed. Please refer to the Current Medication list given to you today.    Labwork: NONE ORDERED  Testing/Procedures: NONE ORDERED  Follow-Up: Your physician wants you to follow-up in: [place] DR. Acie Fredrickson You will receive a reminder letter in the mail two months in advance. If you don't receive a letter, please call our office to schedule the follow-up appointment.   Any Other Special Instructions Will Be Listed Below (If Applicable).     If you need a refill on your cardiac medications before your next appointment, please call your pharmacy.

## 2016-08-20 ENCOUNTER — Other Ambulatory Visit: Payer: Self-pay | Admitting: Physician Assistant

## 2016-08-20 MED ORDER — APIXABAN 5 MG PO TABS: 5.0000 mg | ORAL_TABLET | Freq: Two times a day (BID) | ORAL | 1 refills | Status: DC

## 2016-08-20 NOTE — Telephone Encounter (Signed)
Pt calling requesting a refill on Eliquis and for it to be sent to [facility] on [address] in [place]. Pt would like a call back.

## 2016-08-20 NOTE — Telephone Encounter (Signed)
Age 71 years Wt 82.3kg 07/29/2016 Saw Richardson Dopp on 07/29/2016  07/13/2016 SrCr 0.75 07/13/2016 Hgb 15.2 HCT 45.0 Refill done for Eliquis 5mg  q 12 hours  as requested and pt notified

## 2016-08-20 NOTE — Telephone Encounter (Signed)
07/13/2016 Hgb 15.1 instead of 15.2

## 2016-10-11 ENCOUNTER — Telehealth: Payer: Self-pay | Admitting: Cardiology

## 2016-10-11 NOTE — Telephone Encounter (Signed)
Spoke with patient and made her aware that I would place some samples at the front desk. I have made her aware that we are not able to provide samples ongoing for the remainder of the year. She states that she is willing to apply for patient assistance, she is going to look at the website. Patient appreciative for my assistance.

## 2016-10-11 NOTE — Telephone Encounter (Signed)
New Message  Patient calling the office for samples of medication:   1.  What medication and dosage are you requesting samples for? Eliquis 5mg    2.  Are you currently out of this medication? Yes; donut hole

## 2016-10-21 ENCOUNTER — Telehealth: Payer: Self-pay | Admitting: Cardiovascular Disease

## 2016-10-21 NOTE — Telephone Encounter (Signed)
New message      Pt want to know if we have a form to apply for assistance with eliquis?  She will be in the office after 1 to get samples and if we have the form, she can pick it up at that time.  Call if there is a problem

## 2016-10-21 NOTE — Telephone Encounter (Signed)
This was left up at front desk with samples.

## 2016-10-28 ENCOUNTER — Encounter: Payer: Self-pay | Admitting: Neurology

## 2016-10-29 ENCOUNTER — Ambulatory Visit: Payer: Self-pay | Admitting: Neurology

## 2016-10-30 ENCOUNTER — Encounter (INDEPENDENT_AMBULATORY_CARE_PROVIDER_SITE_OTHER): Payer: Self-pay

## 2016-10-30 ENCOUNTER — Encounter: Payer: Self-pay | Admitting: Neurology

## 2016-10-30 ENCOUNTER — Ambulatory Visit (INDEPENDENT_AMBULATORY_CARE_PROVIDER_SITE_OTHER): Payer: Medicare Other | Admitting: Neurology

## 2016-10-30 VITALS — BP 174/87 | HR 61 | Ht 63.0 in | Wt 186.0 lb

## 2016-10-30 DIAGNOSIS — G4739 Other sleep apnea: Secondary | ICD-10-CM

## 2016-10-30 DIAGNOSIS — Z9989 Dependence on other enabling machines and devices: Secondary | ICD-10-CM

## 2016-10-30 DIAGNOSIS — G4731 Primary central sleep apnea: Secondary | ICD-10-CM

## 2016-10-30 NOTE — Progress Notes (Signed)
SLEEP MEDICINE CLINIC   Provider:  Larey Seat, M D  Primary Care Physician:  Velna Hatchet, MD   Referring Provider: Velna Hatchet, MD     HPI: I have the pleasure of seeing Beth Haynes today on 10/30/2016 in a follow-up visit after CPAP titration performed on 06/25/2016, the patient was titrated first to CPAP but then this was switched to a BiPAP setting which she tolerated better. She did very well with a ASV setting of 5 cm EPAP, a minimum pressure support of 3 cm and a maximum pressure support of 15 cm. She also had less and less arousals as the night went on not just due to respiratory factors but also due to an increased oxygen nadir. She was fitted with an E son nasal mask. She tolerated ASV much better than BiPAP. I have now the pleasure of seeing the first compliance visit the patient has used the machine 83% for the last 30 days over 4 hours with an average user time of 6 hours and 7 minutes, ASV settings as quoted above her residual AHI is 4.6 of which 3.5 by hypopneas.      Beth Haynes is a 71 y.o. female , seen here as in a referral from Dr. Ardeth Perfect for a new sleep apnea evaluation.   Chief complaint according to patient : "I was diagnosed with apnea but could not get CPAP to work right". Mrs. Beth Haynes husband is also a patient of my sleep practice, she was referred by her cardiologist, Dr. Cathie Olden, for sleep evaluation to the [facility] sleep lab. She reports that atrial fibrillation was her main risk factor. In addition she had hypertension and a body mass index exceeding 30. Atrial fibrillation has been persistent, she is chronically anticoagulated on Eloquis. The patient reportedly has a cardiac valve regurgitation, diagnosed by echocardiogram but I do not have the original diagnostic tests available.The patient also has a history of hyperlipidemia, panic attacks,, irritable bowel syndrome, hypertension, urinary incontinence.   Sleep habits are as follows: The  patient likes to read and she goes to bed but she falls asleep very promptly and feels that she can sleep uninterrupted through the night. She remembers vivid, beautiful dreams not nightmarish in character but colorful and very interesting.  h she shares the bedroom with her husband , who reports her to snore very, very loudly. He is hard of hearing and not normally bothered.  She usually retreats to her bedroom by about 10 PM starts to read and is asleep within 15-30 minutes. She prefers to sleep on her back and on her side. She sleeps on 2 thin, firm pillows. Her husband reports that she seems to be gasping for air and still snoring while using CPAP.  Sleep medical history and family sleep history: Mrs. Shorts maternal grandmother died at age 22 of a sudden cardiac death, her mother collapsed and fell hitting her had never regaining consciousness died at age 71. Mrs. Eckels has undergone a tonsillectomy in 1963, she had frequent falls and broken both ankles, left wrist and left collarbone. Social history: married, retired, does not drink caffeine, lifelong non smoker, ETOH seldomly.   Her cholesterol and her last labs from April was 188 total, triglycerides 101, HDL was 44, creatinine was 0.7 liver function tests only marginal elevation of ALT at 41 units per liter was noted. She is mildly leukopenic at 3.97K, but she does have no evidence of anemia. TSH was in normal limits.  I reviewed  the patient's previous to sleep studies she was admitted for a split night polysomnography on 01/11/2015. The patient had frequent hypopneas and apneas the RDI was 56.7 which is very high. She did not have Cheyne-Stokes breathing of central apneas. She seemed to have slept all night in supine sleep and therefore all her apneas and hypotony as a clerk in supine sleep. She was titrated to CPAP and is establishing a high pressure of 19 cm water and her AHI did not respond it remained in the 2 digits. Her AHI was 65.5 on CPAP of  19 cm water she was changed to bilevel and remained with an AHI in the 60s under 23/19 cm water she was asked back for full night BiPAP titration on 04/11/2015 and her RDI exacerbated to 44.5;  this same procedure of a bilevel titration again to a pressure of 19/15 cm water resulted in a residual AHI of 26.7.  Oxygen nadir was 90% saturation.  Overall,  the patient barely slept and she continued to have difficulties using the machine at home. An optimal pressure was never identified.  Review of Systems:  Out of a complete 14 system review, the patient complains of only the following symptoms, and all other reviewed systems are negative. snoring Hypersomnia, excessive sleepiness.  Epworth score 21 with vivid dreams now on ASV reduced to 12/24  .  , Fatigue severity score 32 from  46  , depression score 5/15 on treatment.    Social History   Social History  . Marital status: Married    Spouse name: N/A  . Number of children: N/A  . Years of education: N/A   Occupational History  . Not on file.   Social History Main Topics  . Smoking status: Never Smoker  . Smokeless tobacco: Never Used  . Alcohol use No  . Drug use: No  . Sexual activity: Not on file   Other Topics Concern  . Not on file   Social History Narrative  . No narrative on file    Family History  Problem Relation Age of Onset  . Heart failure Mother   . Heart attack Father   . Hypertension Maternal Grandmother   . Heart attack Paternal Grandfather   . Breast cancer Maternal Aunt     Past Medical History:  Diagnosis Date  . Atrial fibrillation ([place])   . IBS (irritable bowel syndrome)   . Obesity (BMI 30-39.9) 07/11/2015  . OSA (obstructive sleep apnea) 01/17/2015   Severe with AHI 35.8/hr  . Panic attacks   . Urinary incontinence, nocturnal enuresis     Past Surgical History:  Procedure Laterality Date  . TONSILLECTOMY      Current Outpatient Prescriptions  Medication Sig Dispense Refill  . apixaban  (ELIQUIS) 5 MG TABS tablet Take 1 tablet (5 mg total) by mouth 2 (two) times daily. 180 tablet 1  . losartan (COZAAR) 50 MG tablet Take 50 mg by mouth daily. Reported on 07/20/2015  5  . sertraline (ZOLOFT) 100 MG tablet Take 100 mg by mouth daily.    . sertraline (ZOLOFT) 50 MG tablet Take 50 mg by mouth daily.     No current facility-administered medications for this visit.     Allergies as of 10/30/2016 - Review Complete 07/29/2016  Allergen Reaction Noted  . Epinephrine  06/12/2016  . Sulfa antibiotics  08/24/2014    Vitals: There were no vitals taken for this visit. Last Weight:  Wt Readings from Last 1 Encounters:  07/29/16  181 lb 6.4 oz (82.3 kg)   OQH:UTMLY is no height or weight on file to calculate BMI.     Last Height:   Ht Readings from Last 1 Encounters:  07/29/16 5' 2.5" (1.588 m)    Physical exam:  General: The patient is awake, alert and appears not in acute distress. The patient is well groomed. Head: Normocephalic, atraumatic. Neck is supple. Mallampati 2  neck circumference:15. Nasal airflow congested, but patent,  TMJ click is not evident . Retrognathia is not seen.  Cardiovascular: irregular rate and rhythm, without  murmurs or carotid bruit, and without distended neck veins. Respiratory: Lungs are clear to auscultation. Skin:  Without evidence of edema, or rash Trunk: BMI is elevated . The patient's posture is erect.  Neurologic exam : The patient is awake and alert, oriented to place and time.   Memory subjective described as intact.  Attention span & concentration ability appears normal.  Speech is fluent,  without dysarthria, dysphonia or aphasia.  Mood and affect are appropriate.  Cranial nerves: Pupils are equal and briskly reactive to light. Funduscopic exam without evidence of pallor or edema. Extraocular movements  in vertical and horizontal planes intact and without nystagmus. Visual fields by finger perimetry are intact. Hearing to finger rub  intact. Facial sensation intact to fine touch. Facial motor strength is symmetric and tongue and uvula move midline. Shoulder shrug was symmetrical.  Strength within normal limits. Stance is stable and normal.  Deep tendon reflexes: in the  upper and lower extremities are brisk, mostly on the left -intact. Babinski maneuver response is downgoing.    Assessment:  After physical and neurologic examination, review of laboratory studies,  Personal review of imaging studies, reports of other /same  Imaging studies, results of polysomnography and / or neurophysiology testing and pre-existing records as far as provided in visit., my assessment is    In summary, Mrs. Jemmott has several risk factors and obviously has been diagnosed with severe sleep apnea but she could not respond to CPAP nor BiPAP.  She is now on ASV and much better- She had  excessive daytime sleepiness and obviously endorsed the Epworth sleepiness score at 21 points she also felt that she could only sleep when the CPAP / BiPAP machine was not in place.  This has changed.  She had dealings with her durable medical equipment company, at the time [facility] home [facility] and return to the BiPAP machine after 3 weeks so that she would not be charged for machine she couldn't use. I would think that positive airway therapy is the only way to elevate oxygen levels and prevent paroxysmal atrial fibrillation at night.  It reduces her stroke risk. It does cause not replace anticoagulation therapy.    The patient was advised of the nature of the diagnosed disorder , the treatment options and the  risks for general health and wellness arising from not treating the condition. She has treatment emergent central sleep apnea.  I spent more than 30 minutes of face to face time with the patient. Greater than 50% of time was spent in counseling and coordination of care.  We have discussed the diagnosis and differential and I answered the patient's questions.     Plan:  Treatment plan and additional workup : continue on ASV since she cannot tolerate BiPAP. The patient would like to follow Linzie Collin, at [place].    Larey Seat, MD 6/50/3546, 56:81 AM  Certified in Neurology by ABPN Certified in Sleep Medicine  by [facility] [address], [place], [place] [address]

## 2016-11-14 NOTE — Telephone Encounter (Signed)
Received fax from [facility] stating they have received application from this patient for assistance with ELIQUIS.  Application case # [id number]

## 2016-11-21 ENCOUNTER — Telehealth: Payer: Self-pay | Admitting: *Deleted

## 2016-11-21 ENCOUNTER — Other Ambulatory Visit: Payer: Self-pay | Admitting: *Deleted

## 2016-11-21 DIAGNOSIS — I4891 Unspecified atrial fibrillation: Secondary | ICD-10-CM

## 2016-11-21 MED ORDER — APIXABAN 5 MG PO TABS: 5.0000 mg | ORAL_TABLET | Freq: Two times a day (BID) | ORAL | 1 refills | Status: DC

## 2016-11-21 NOTE — Telephone Encounter (Signed)
Received fax from [facility] regarding patient assistance for [employer]. They still need provider information and prescription. I will place form and script in Dr Lanny Hurst box, he is not back in office until 12/03/2016.

## 2016-11-26 NOTE — Telephone Encounter (Signed)
**Note De-Identified  Obfuscation** I have faxed the provider part and RX to [facility] with a message asking them to add to the pts application as the provider part was missing.

## 2016-12-03 NOTE — Telephone Encounter (Signed)
**Note De-Identified Casara Perrier Obfuscation** Denial for Eliquis received Kayven Aldaco fax from [place].

## 2017-02-10 ENCOUNTER — Telehealth: Payer: Self-pay | Admitting: Neurology

## 2017-02-10 NOTE — Telephone Encounter (Signed)
Patient reports having difficulty tolerating CPAP pressure and mask, pulling off mask at night, difficulty falling asleep. I advised pt to try Melatonin at night, 1 mg, 1 hour before BT. It would be best to review download and adjust pressure if needed. Dr. Keturah Barre., please review download when possible.

## 2017-02-11 ENCOUNTER — Encounter: Payer: Self-pay | Admitting: Neurology

## 2017-02-12 NOTE — Telephone Encounter (Addendum)
Patient spoke to Dr. Rexene Alberts on 02-10-17 because of breathing issues with CPAP. Dr. Rexene Alberts advised her to take [place] which she did and it helped that night but last night the CPAP mask kept coming off. She is very concerned about breathing issues with CPAP. She is not sure what to do. I advised Dr.Dohmeier is out of the office and will send message to her nurse.Please call and discuss.

## 2017-02-12 NOTE — Telephone Encounter (Signed)
Pt called back and is concerned questioning if she needs to go to the hospital. When I asked her to describe the breathing difficulties she is experiencing she states that she was recently refitted with a mask and it helped but she feels like when using the machine the pressure is too much. She states before she had clonazepam (left over from her husband med) that she was taking to help her with using the machine. She did use the melatonin and it helped the first night but the second night it wasn't helpful. Pt admits to anxiety and I informed her that it could be anxiety related to the mask. I instructed her in the mean time to get in at [facility] and see what mask adjustments needed to be made and Dr Rexene Alberts agreed with this suggestion. I informed the patient that Dr Brett Fairy is out of the office this week and that I will show her her download when she gets back in and if she has any other recommendations or feels that any other adjustments needs to be made I will call and make her aware. Pt verbalized understanding and plans on reaching out to [employer] today.

## 2017-02-13 NOTE — Telephone Encounter (Signed)
Pt calling again and stating that no one called her from [facility] and she is having difficulty with the machine and the mask. I informed her that I ran her download by Dr Rexene Alberts and she was able to view there was a mask leak present. She didn't feel like pressure adjustments needed to be made at that time  But that the patient needed to look at getting her mask fit. I gave the patient the number once more to [facility] and I will also send a message for someone to reach out to her.

## 2017-02-14 NOTE — Telephone Encounter (Signed)
Perhaps a delay due to holiday closing? Please encourage her to call [employer] again.

## 2017-02-17 ENCOUNTER — Telehealth: Payer: Self-pay | Admitting: Neurology

## 2017-02-17 NOTE — Telephone Encounter (Signed)
Pt says she is having problems breathing with her CPAP is wanting to speak with RN to know if there is anything she can do or if she should go to the hospital. Please call to discuss

## 2017-02-17 NOTE — Telephone Encounter (Signed)
Called the pt she states that she went and had her mask changed on Friday 02/14/17. She states that she was given a full face mask. She says that she doesn't feel like that is helpful. She says she has to take deep breaths to breathe in. I questioned if there was tightness in chest the patient says yes. Pt says she has been nauseous. I mentioned that she may have a cold and should reach out to her PCP. It either that or anxiety related. Pt should questions and discuss with PCP. Pt verbalized understanding. ]

## 2017-02-20 ENCOUNTER — Encounter (HOSPITAL_COMMUNITY): Payer: Self-pay | Admitting: Emergency Medicine

## 2017-02-20 ENCOUNTER — Emergency Department (HOSPITAL_COMMUNITY): Payer: Medicare Other

## 2017-02-20 ENCOUNTER — Inpatient Hospital Stay (HOSPITAL_COMMUNITY)
Admission: EM | Admit: 2017-02-20 | Discharge: 2017-02-23 | DRG: 308 | Disposition: A | Discharge status: Home Health | Payer: Medicare Other | Attending: Family Medicine | Admitting: Family Medicine

## 2017-02-20 DIAGNOSIS — F410 Panic disorder [episodic paroxysmal anxiety] without agoraphobia: Secondary | ICD-10-CM | POA: Diagnosis present

## 2017-02-20 DIAGNOSIS — I10 Essential (primary) hypertension: Secondary | ICD-10-CM | POA: Diagnosis not present

## 2017-02-20 DIAGNOSIS — I5033 Acute on chronic diastolic (congestive) heart failure: Secondary | ICD-10-CM | POA: Diagnosis present

## 2017-02-20 DIAGNOSIS — I4819 Other persistent atrial fibrillation: Secondary | ICD-10-CM | POA: Diagnosis present

## 2017-02-20 DIAGNOSIS — I5032 Chronic diastolic (congestive) heart failure: Secondary | ICD-10-CM | POA: Diagnosis not present

## 2017-02-20 DIAGNOSIS — Z7901 Long term (current) use of anticoagulants: Secondary | ICD-10-CM

## 2017-02-20 DIAGNOSIS — F0390 Unspecified dementia without behavioral disturbance: Secondary | ICD-10-CM | POA: Diagnosis present

## 2017-02-20 DIAGNOSIS — I4891 Unspecified atrial fibrillation: Secondary | ICD-10-CM | POA: Diagnosis not present

## 2017-02-20 DIAGNOSIS — F32A Depression, unspecified: Secondary | ICD-10-CM | POA: Diagnosis present

## 2017-02-20 DIAGNOSIS — G4733 Obstructive sleep apnea (adult) (pediatric): Secondary | ICD-10-CM | POA: Diagnosis present

## 2017-02-20 DIAGNOSIS — Z8249 Family history of ischemic heart disease and other diseases of the circulatory system: Secondary | ICD-10-CM

## 2017-02-20 DIAGNOSIS — Z9114 Patient's other noncompliance with medication regimen: Secondary | ICD-10-CM

## 2017-02-20 DIAGNOSIS — K589 Irritable bowel syndrome without diarrhea: Secondary | ICD-10-CM | POA: Diagnosis present

## 2017-02-20 DIAGNOSIS — N390 Urinary tract infection, site not specified: Secondary | ICD-10-CM | POA: Diagnosis present

## 2017-02-20 DIAGNOSIS — I480 Paroxysmal atrial fibrillation: Principal | ICD-10-CM | POA: Diagnosis present

## 2017-02-20 DIAGNOSIS — R0602 Shortness of breath: Secondary | ICD-10-CM

## 2017-02-20 DIAGNOSIS — F329 Major depressive disorder, single episode, unspecified: Secondary | ICD-10-CM | POA: Diagnosis present

## 2017-02-20 DIAGNOSIS — I110 Hypertensive heart disease with heart failure: Secondary | ICD-10-CM | POA: Diagnosis present

## 2017-02-20 LAB — BASIC METABOLIC PANEL
ANION GAP: 10 (ref 5–15)
BUN: 14 mg/dL (ref 6–20)
CO2: 24 mmol/L (ref 22–32)
Calcium: 9.8 mg/dL (ref 8.9–10.3)
Chloride: 108 mmol/L (ref 101–111)
Creatinine, Ser: 0.75 mg/dL (ref 0.44–1.00)
GFR calc non Af Amer: >60 mL/min (ref >60)
Glucose, Bld: 129 mg/dL — ABNORMAL HIGH (ref 65–99)
Potassium: 3.8 mmol/L (ref 3.5–5.1)
SODIUM: 142 mmol/L (ref 135–145)

## 2017-02-20 LAB — URINALYSIS, ROUTINE W REFLEX MICROSCOPIC
Bilirubin Urine: NEGATIVE
Glucose, UA: NEGATIVE mg/dL
HGB URINE DIPSTICK: NEGATIVE
Ketones, ur: NEGATIVE mg/dL
LEUKOCYTES UA: NEGATIVE
Nitrite: NEGATIVE
Protein, ur: 100 mg/dL — AB
SPECIFIC GRAVITY, URINE: 1.043 — AB (ref 1.005–1.030)
pH: 5 (ref 5.0–8.0)

## 2017-02-20 LAB — T4, FREE: Free T4: 1.03 ng/dL (ref 0.61–1.12)

## 2017-02-20 LAB — CBC
HCT: 45.9 % (ref 36.0–46.0)
HEMOGLOBIN: 14.4 g/dL (ref 12.0–15.0)
MCH: 27.9 pg (ref 26.0–34.0)
MCHC: 31.4 g/dL (ref 30.0–36.0)
MCV: 89 fL (ref 78.0–100.0)
PLATELETS: 213 10*3/uL (ref 150–400)
RBC: 5.16 MIL/uL — AB (ref 3.87–5.11)
RDW: 15.6 % — ABNORMAL HIGH (ref 11.5–15.5)
WBC: 6.4 10*3/uL (ref 4.0–10.5)

## 2017-02-20 LAB — BRAIN NATRIURETIC PEPTIDE: B Natriuretic Peptide: 387.9 pg/mL — ABNORMAL HIGH (ref 0.0–100.0)

## 2017-02-20 LAB — MAGNESIUM: MAGNESIUM: 2 mg/dL (ref 1.7–2.4)

## 2017-02-20 LAB — TROPONIN I

## 2017-02-20 LAB — RAPID URINE DRUG SCREEN, HOSP PERFORMED
Amphetamines: NOT DETECTED
BARBITURATES: NOT DETECTED
BENZODIAZEPINES: NOT DETECTED
Cocaine: NOT DETECTED
Opiates: NOT DETECTED
Tetrahydrocannabinol: NOT DETECTED

## 2017-02-20 LAB — I-STAT TROPONIN, ED: TROPONIN I, POC: 0.01 ng/mL (ref 0.00–0.08)

## 2017-02-20 MED ORDER — HYDRALAZINE HCL 20 MG/ML IJ SOLN: 5.0000 mg | INTRAMUSCULAR | Status: DC | PRN

## 2017-02-20 MED ORDER — SERTRALINE HCL 100 MG PO TABS
100.0000 mg | ORAL_TABLET | Freq: Every day | ORAL | Status: DC
  Administered 2017-02-21 – 2017-02-23 (×3): 100 mg via ORAL
  Filled 2017-02-20 (×4): qty 1

## 2017-02-20 MED ORDER — HYDROXYZINE HCL 10 MG PO TABS
10.0000 mg | ORAL_TABLET | Freq: Three times a day (TID) | ORAL | Status: DC | PRN
  Filled 2017-02-20: qty 1

## 2017-02-20 MED ORDER — FUROSEMIDE 10 MG/ML IJ SOLN
40.0000 mg | Freq: Once | INTRAMUSCULAR | Status: AC
  Administered 2017-02-20: 40 mg via INTRAVENOUS
  Filled 2017-02-20: qty 4

## 2017-02-20 MED ORDER — RIVAROXABAN 20 MG PO TABS
20.0000 mg | ORAL_TABLET | Freq: Every day | ORAL | Status: DC
  Administered 2017-02-20 – 2017-02-22 (×3): 20 mg via ORAL
  Filled 2017-02-20 (×3): qty 1

## 2017-02-20 MED ORDER — DILTIAZEM LOAD VIA INFUSION
10.0000 mg | Freq: Once | INTRAVENOUS | Status: AC
  Administered 2017-02-20: 10 mg via INTRAVENOUS
  Filled 2017-02-20: qty 10

## 2017-02-20 MED ORDER — ZOLPIDEM TARTRATE 5 MG PO TABS
5.0000 mg | ORAL_TABLET | Freq: Every evening | ORAL | Status: DC | PRN
  Administered 2017-02-20: 5 mg via ORAL
  Filled 2017-02-20: qty 1

## 2017-02-20 MED ORDER — IOPAMIDOL (ISOVUE-370) INJECTION 76%
INTRAVENOUS | Status: AC
  Administered 2017-02-20: 100 mL
  Filled 2017-02-20: qty 100

## 2017-02-20 MED ORDER — LORAZEPAM 2 MG/ML IJ SOLN
0.5000 mg | Freq: Once | INTRAMUSCULAR | Status: AC | PRN
Start: 2017-02-20 — End: 2017-02-20
  Administered 2017-02-20: 0.5 mg via INTRAVENOUS
  Filled 2017-02-20: qty 1

## 2017-02-20 MED ORDER — ALPRAZOLAM 0.25 MG PO TABS
0.2500 mg | ORAL_TABLET | Freq: Two times a day (BID) | ORAL | Status: DC | PRN
  Administered 2017-02-20 – 2017-02-21 (×2): 0.25 mg via ORAL
  Filled 2017-02-20 (×2): qty 1

## 2017-02-20 MED ORDER — ACETAMINOPHEN 325 MG PO TABS: 650.0000 mg | ORAL_TABLET | ORAL | Status: DC | PRN

## 2017-02-20 MED ORDER — DILTIAZEM HCL-DEXTROSE 100-5 MG/100ML-% IV SOLN (PREMIX)
5.0000 mg/h | INTRAVENOUS | Status: DC
  Administered 2017-02-20: 5 mg/h via INTRAVENOUS
  Administered 2017-02-21: 7.5 mg/h via INTRAVENOUS
  Filled 2017-02-20 (×3): qty 100

## 2017-02-20 MED ORDER — LOSARTAN POTASSIUM 50 MG PO TABS
50.0000 mg | ORAL_TABLET | Freq: Every day | ORAL | Status: DC
Start: 2017-02-21 — End: 2017-02-23
  Administered 2017-02-21 – 2017-02-23 (×3): 50 mg via ORAL
  Filled 2017-02-20 (×4): qty 1

## 2017-02-20 MED ORDER — ONDANSETRON HCL 4 MG/2ML IJ SOLN: 4.0000 mg | Freq: Four times a day (QID) | INTRAMUSCULAR | Status: DC | PRN

## 2017-02-20 NOTE — ED Notes (Signed)
Admitting physician at bedside

## 2017-02-20 NOTE — ED Notes (Signed)
Pharmacy messaged about medication verification.

## 2017-02-20 NOTE — ED Triage Notes (Signed)
Per EMS Pt is from [facility], she went there due to increased SOB over the last 3 days.  She has a history of Hypertension and AFIB for which she takes Eliquis, she stated that she has not taken her Elequis the last 3 days to the fact that she believes it makes her more short of breath.  She does not have a history of heart failure, but EMS stated that her PCP thought that she may now.  She is AOx4 NAD noted at this time. She received 10mg  of metoprolol IV in route to [facility].

## 2017-02-20 NOTE — ED Notes (Signed)
Pt was found by RN standing at the foot of the bed. Pt evidently completely unclothed herself, spilled water in the bedding, and had pulled off most of her cord monitors. Bedding was changed, Pt was redressed and given call light. PA with her now.

## 2017-02-20 NOTE — ED Notes (Signed)
HR 91; cardizem will not be titrated up

## 2017-02-20 NOTE — Progress Notes (Signed)
ANTICOAGULATION CONSULT NOTE - Initial Consult  Pharmacy Consult for Xarelto  Indication: atrial fibrillation  Allergies  Allergen Reactions  . Epinephrine     Panic attacks   . Sulfa Antibiotics     Childhood allergy     Patient Measurements: Height: 5\' 3"  (160 cm) Weight: 185 lb (83.9 kg) IBW/kg (Calculated) : 52.4  Vital Signs: Temp: 97.6 F (36.4 C) (01/10 1124) BP: 133/94 (01/10 2051) Pulse Rate: 95 (01/10 2054)  Labs: Recent Labs    02/20/17 1142  HGB 14.4  HCT 45.9  PLT 213  CREATININE 0.75    Estimated Creatinine Clearance: 65.2 mL/min (by C-G formula based on SCr of 0.75 mg/dL).   Medical History: Past Medical History:  Diagnosis Date  . Atrial fibrillation ([place])   . IBS (irritable bowel syndrome)   . Obesity (BMI 30-39.9) 07/11/2015  . OSA (obstructive sleep apnea) 01/17/2015   Severe with AHI 35.8/hr  . Panic attacks   . Urinary incontinence, nocturnal enuresis     Medications:  Eliquis 5mg  po BID - last dose 02/20/17 at 0930 AM (confirmed with pt)  Assessment: 72 year old female with paroxysmal atrial fibrillation with RVR at PCP office today. Patient was on Eliquis but had missed doses. Pharmacy has been consulted to change to Xarelto for once a day dosing regimen. Last dose of Eliquis was today at 0930 AM. Scr and CBC WNL. No bleeding noted.  Goal of Therapy: Monitor platelets by anticoagulation protocol: Yes   Plan:  Start Xarelto 20mg  po once daily at supper - to start when next Eliquis dose is due Daily H&H and platelets  Sumit Branham 02/20/2017,9:33 PM

## 2017-02-20 NOTE — ED Notes (Signed)
Admitting at bedside 

## 2017-02-20 NOTE — Consult Note (Signed)
See prior note as consult. IM/Family medicine to admit patient for evaluation on her dementia.   Leanor Kail, PA-C

## 2017-02-20 NOTE — Progress Notes (Deleted)
ANTICOAGULATION CONSULT NOTE - Initial Consult  Pharmacy Consult for Xarelto  Indication: atrial fibrillation  Allergies  Allergen Reactions  . Epinephrine     Panic attacks   . Sulfa Antibiotics     Childhood allergy     Patient Measurements: Height: 5\' 3"  (160 cm) Weight: 185 lb (83.9 kg) IBW/kg (Calculated) : 52.4  Vital Signs: Temp: 97.6 F (36.4 C) (01/10 1124) BP: 133/94 (01/10 2051) Pulse Rate: 95 (01/10 2054)  Labs: Recent Labs    02/20/17 1142  HGB 14.4  HCT 45.9  PLT 213  CREATININE 0.75    Estimated Creatinine Clearance: 65.2 mL/min (by C-G formula based on SCr of 0.75 mg/dL).   Medical History: Past Medical History:  Diagnosis Date  . Atrial fibrillation ([place])   . IBS (irritable bowel syndrome)   . Obesity (BMI 30-39.9) 07/11/2015  . OSA (obstructive sleep apnea) 01/17/2015   Severe with AHI 35.8/hr  . Panic attacks   . Urinary incontinence, nocturnal enuresis     Medications:  Eliquis 5mg  po BID - last dose 02/20/17 at 0930 AM (confirmed with pt)  Assessment: 72 year old female with paroxysmal atrial fibrillation with RVR at PCP office today. Patient was on Eliquis but had missed doses. Pharmacy has been consulted to change to Xarelto for once a day dosing regimen. Last dose of Eliquis was today at 0930 AM. Scr and CBC WNL. No bleeding noted.  Goal of Therapy: Monitor platelets by anticoagulation protocol: Yes   Plan:  Start Xarelto 20mg  po once daily at supper - to start when next Eliquis dose is due Daily H&H and platelets  Beth Haynes 02/20/2017,9:37 PM

## 2017-02-20 NOTE — ED Notes (Signed)
Pt used bedpan to urinate. UA was collected in case it's needed. Pt was very concerned about her RAC IV--was attempting to pull it out and had to be convinced otherwise. Also voiced that she was annoyed by her pulseOx.

## 2017-02-20 NOTE — H&P (Signed)
Cardiology Consultation:   Patient ID: Beth Haynes; [id number]; [birth year]   Admit date: 02/20/2017 Date of Consult: 02/20/2017  Primary Care Provider: Velna Hatchet, MD Primary Cardiologist: Dr. Acie Fredrickson   Patient Profile:   Beth Haynes is a 72 y.o. female with a history of PAF, HTN, OSA and baseline dementia presented for DOE from PCP office and found to have afib RVR who is being seen today for the evaluation of afib at the request of Dr. Marcha Dutton.   Echo 12/07/14 Moderate concentric LVH, EF 60-65, normal wall motion, grade 2 diastolic dysfunction, MAC, mild RAE, PASP 36  Event monitor 9/16 NSR, paroxysmal atrial fibrillation  She was doing well on cardiac stand point when last seen by APP 07/2016.   History of Present Illness:   Beth Haynes has been having intermittent shortness of breath for the past 4 weeks with and without exertion. Its been intermittent, lasting for hours and minutes. She been not aware of atrial fibrillation. Denies chest pain, orthopnea, PND, dizziness, LE edema or syncope. She has cough but no fever or chills. She went to see PCP and send her by EMS for further evaluation . Noted in afib RVR. She was given IV metoprolol. BNP minimally elevated. Given IV lasix 56m x 1. Started on IV cardizem.  This has been taking her medication intermittently for past many weeks including Eliquis. Report missing at least 2 days/week.   Electrolytes and kidney function are normal. BNP 387. CTA of chest showed no PE however showed component of CHF, 3V calcification and mitral valve calcification. CXr showed small effusion.   Past Medical History:  Diagnosis Date  . Atrial fibrillation ([place]   . IBS (irritable bowel syndrome)   . Obesity (BMI 30-39.9) 07/11/2015  . OSA (obstructive sleep apnea) 01/17/2015   Severe with AHI 35.8/hr  . Panic attacks   . Urinary incontinence, nocturnal enuresis     Past Surgical History:  Procedure Laterality Date  . TONSILLECTOMY         Inpatient Medications: Scheduled Meds:  Continuous Infusions: . diltiazem (CARDIZEM) infusion 5 mg/hr (02/20/17 1211)   PRN Meds:   Allergies:    Allergies  Allergen Reactions  . Epinephrine     Panic attacks   . Sulfa Antibiotics     Childhood allergy     Social History:   Social History   Socioeconomic History  . Marital status: Married    Spouse name: Not on file  . Number of children: Not on file  . Years of education: Not on file  . Highest education level: Not on file  Social Needs  . Financial resource strain: Not on file  . Food insecurity - worry: Not on file  . Food insecurity - inability: Not on file  . Transportation needs - medical: Not on file  . Transportation needs - non-medical: Not on file  Occupational History  . Not on file  Tobacco Use  . Smoking status: Never Smoker  . Smokeless tobacco: Never Used  Substance and Sexual Activity  . Alcohol use: No  . Drug use: No  . Sexual activity: Not on file  Other Topics Concern  . Not on file  Social History Narrative  . Not on file    Family History:    Family History  Problem Relation Age of Onset  . Heart failure Mother   . Heart attack Father   . Hypertension Maternal Grandmother   . Heart attack Paternal Grandfather   . Breast  cancer Maternal Aunt      ROS:  Please see the history of present illness.  ROS All other ROS reviewed and negative.     Physical Exam/Data:   Vitals:   02/20/17 1330 02/20/17 1400 02/20/17 1500 02/20/17 1530  BP: (!) 169/100 (!) 118/104 121/76 (!) 149/114  Pulse: (!) 101 70 100 (!) 102  Resp: (!) _0 Temp:      SpO2: 98% 94% 92% 95%  Weight:      Height:       No intake or output data in the 24 hours ending 02/20/17 1748 Filed Weights   02/20/17 1125  Weight: 185 lb (83.9 kg)   Body mass index is 32.77 kg/m.  General:  Well nourished, well developed, in no acute distress HEENT: normal Lymph: no adenopathy Neck: no   JVD Endocrine:  No thryomegaly Vascular: No carotid bruits; FA pulses 2+ bilaterally without bruits  Cardiac:  normal S1, S2; Irregularly irregular tachycardic; no murmur Lungs:  clear to auscultation bilaterally, no wheezing, rhonchi or rales  Abd: soft, nontender, no hepatomegaly  Ext: no  edema Musculoskeletal:  No deformities, BUE and BLE strength normal and equal Skin: warm and dry  Neuro:  CNs 2-12 intact, no focal abnormalities noted Psych:  Answers questions appropriately but demented   EKG:  The ECG that was done today was personally reviewed and demonstrates afib at rate of 145 bpm  Relevant CV Studies:  [facility] 11/2014 Study Conclusions  - Left ventricle: The cavity size was normal. There was moderate   concentric hypertrophy. Systolic function was normal. The   estimated ejection fraction was in the range of 60% to 65%. Wall   motion was normal; there were no regional wall motion   abnormalities. Features are consistent with a pseudonormal left   ventricular filling pattern, with concomitant abnormal relaxation   and increased filling pressure (grade 2 diastolic dysfunction).   Doppler parameters are consistent with high ventricular filling   pressure. - Aortic valve: Trileaflet; normal thickness, mildly calcified   leaflets. - Mitral valve: Severely calcified annulus. There was trivial   regurgitation. - Right atrium: The atrium was mildly dilated. - Tricuspid valve: There was trivial regurgitation. - Pulmonary arteries: PA peak pressure: 36 mm Hg (S). - Impressions: complex cystic lesion in the liver of unknown   etiology. Recommend dedicated abdominal US for further   evaluation.  Impressions:  - complex cystic lesion in the liver of unknown etiology. Recommend   dedicated abdominal US for further evaluation.   Laboratory Data:  Chemistry Recent Labs  Lab 02/20/17 1142  NA 142  K 3.8  CL 108  CO2 24  GLUCOSE 129*  BUN 14  CREATININE 0.75   CALCIUM 9.8  GFRNONAA >60  GFRAA >60  ANIONGAP 10    No results for input(s): PROT, ALBUMIN, AST, ALT, ALKPHOS, BILITOT in the last 168 hours. Hematology Recent Labs  Lab 02/20/17 1142  WBC 6.4  RBC 5.16*  HGB 14.4  HCT 45.9  MCV 89.0  MCH 27.9  MCHC 31.4  RDW 15.6*  PLT 213   Cardiac EnzymesNo results for input(s): TROPONINI in the last 168 hours.  Recent Labs  Lab 02/20/17 1515  TROPIPOC 0.01    BNP Recent Labs  Lab 02/20/17 1509  BNP 387.9*    DDimer No results for input(s): DDIMER in the last 168 hours.  Radiology/Studies:  Dg Chest 2 View  Result Date: 02/20/2017 CLINICAL DATA:  Increasing shortness  of breath for 3 days EXAM: CHEST  2 VIEW COMPARISON:  02/01/2009 FINDINGS: Cardiac shadow is enlarged. The lungs are well aerated bilaterally. Some atelectatic changes are noted in the left lung base. Small left-sided pleural effusion is noted. No other focal infiltrate is seen. No acute bony abnormality is seen. IMPRESSION: Mild left basilar atelectasis and small effusion. Electronically Signed   By: Inez Catalina M.D.   On: 02/20/2017 12:06   Ct Angio Chest Pe W/cm &/or Wo Cm  Result Date: 02/20/2017 CLINICAL DATA:  72 year old female with chest pain and tightness since 10 p.m. yesterday evening. Shortness of breath. EXAM: CT ANGIOGRAPHY CHEST WITH CONTRAST TECHNIQUE: Multidetector CT imaging of the chest was performed using the standard protocol during bolus administration of intravenous contrast. Multiplanar CT image reconstructions and MIPs were obtained to evaluate the vascular anatomy. CONTRAST:  162m ISOVUE-370 IOPAMIDOL (ISOVUE-370) INJECTION 76% COMPARISON:  No priors. FINDINGS: Cardiovascular: No filling defects in the pulmonary arterial tree to suggest pulmonary embolism. Heart size is mildly enlarged with left atrial dilatation. Small amount of pericardial fluid and/or thickening, unlikely to be of any hemodynamic significance at this time. No associated  pericardial calcification. There is aortic atherosclerosis, as well as atherosclerosis of the great vessels of the mediastinum and the coronary arteries, including calcified atherosclerotic plaque in the left anterior descending, left circumflex and right coronary arteries. Severe calcifications of the mitral annulus. Mediastinum/Nodes: No pathologically enlarged mediastinal or hilar lymph nodes. Esophagus is unremarkable in appearance. No axillary lymphadenopathy. Lungs/Pleura: Moderate right and small left pleural effusions lying dependently. Some associated passive subsegmental atelectasis in the dependent portion of the right lower lobe. No consolidative airspace disease. Diffuse but patchy areas of ground-glass attenuation and interlobular septal thickening, most compatible with a background of mild interstitial pulmonary edema. A few scattered tiny pulmonary nodules are noted, largest of which measures 5 mm in the right middle lobe (axial image 41 of series 7). Upper Abdomen: Aortic atherosclerosis. Musculoskeletal: There are no aggressive appearing lytic or blastic lesions noted in the visualized portions of the skeleton. Review of the MIP images confirms the above findings. IMPRESSION: 1. No evidence of pulmonary embolism. 2. The appearance of the chest suggests congestive heart failure, including a background of interstitial pulmonary edema, as well as moderate right and small left pleural effusions. 3. Cardiomegaly with left atrial dilatation. 4. Aortic atherosclerosis, in addition to three-vessel coronary artery disease. Assessment for potential risk factor modification, dietary therapy or pharmacologic therapy may be warranted, if clinically indicated. 5. There are calcifications of the mitral annulus. Echocardiographic correlation for evaluation of potential valvular dysfunction may be warranted if clinically indicated. Aortic Atherosclerosis (ICD10-I70.0). Electronically Signed   By: DVinnie Langton M.D.   On: 02/20/2017 13:58    Assessment and Plan:   1. Atrial fibrillation with RVR - Non compliant with Eliquis, seems due to dementia and lack of education.  - increase Cardizem gtt to 161mhour. Get echo and TSH.  - CHADSVASC score of 3 (age, sex and HTN). She will benefits from one a day medication like Xarelto.   2. Elevated BNP - Likely due to tachycardia. Given Lasix 4069mV x 1.   3. Demential Recommended IM admission of further treatment and evaluation.    For questions or updates, please contact [facility] consult [email or web address] for contact info under Cardiology/STEMI.   SigJarrett SohoA [place]/11/2017 5:48 PM

## 2017-02-20 NOTE — ED Notes (Signed)
Meal ordered for PT 

## 2017-02-20 NOTE — ED Notes (Signed)
PT becoming increasingly agitated. PT keeps asking when she can leave, is asking to take monitoring wires off etc. PT offered a magazine, PT encouraged to watch TV, PT's bag placed in her bed so she can reach it, PT offered a drink a food. PT also taken to bathroom and for a walk around the pod. PT returned to bed, sheets changed, gown changed, placed back in bed. EDP asked for anxiety med order.

## 2017-02-20 NOTE — ED Notes (Signed)
Care handoff to Megan RN 

## 2017-02-20 NOTE — Discharge Instructions (Addendum)

## 2017-02-20 NOTE — ED Provider Notes (Signed)
[facility] EMERGENCY DEPARTMENT Provider Note   CSN: [id number] Arrival date & time: 02/20/17  1116     History   Chief Complaint Chief Complaint  Patient presents with  . Shortness of Breath    HPI Beth Haynes is a 72 y.o. female with past medical history of atrial fibrillation, hypertension, obstructive sleep apnea, presenting to the ED for shortness of breath times 1 week.  Patient states she feels more short of breath with exertion and when laying flat.  Also states she stopped taking her Eliquis about 10 days ago of her own volition. Reports assoc intermittent mild cough. States the metoprolol given by EMS provided her with relief of SOB. She denies LE edema, CP, fever, congestion, or other complaints. No known hx of CHF. Per chart review, ECHO done 12/07/14 with EF 13-08%, grade 2 diastolic dysfunction.  No hx of PE/DVT.  The history is provided by the patient.    Past Medical History:  Diagnosis Date  . Atrial fibrillation ([place])   . IBS (irritable bowel syndrome)   . Obesity (BMI 30-39.9) 07/11/2015  . OSA (obstructive sleep apnea) 01/17/2015   Severe with AHI 35.8/hr  . Panic attacks   . Urinary incontinence, nocturnal enuresis     Patient Active Problem List   Diagnosis Date Noted  . Obstructive sleep apnea hypopnea, severe 06/12/2016  . Obesity (BMI 30-39.9) 07/11/2015  . OSA (obstructive sleep apnea) 01/17/2015  . HTN (hypertension) 01/17/2015  . Atrial fibrillation with rapid ventricular response ([place]) 11/07/2014    Past Surgical History:  Procedure Laterality Date  . TONSILLECTOMY      OB History    No data available       Home Medications    Prior to Admission medications   Medication Sig Start Date End Date Taking? Authorizing Provider  apixaban (ELIQUIS) 5 MG TABS tablet Take 1 tablet (5 mg total) by mouth 2 (two) times daily. 11/21/16 05/20/17 Yes Nahser, Wonda Cheng, MD  losartan (COZAAR) 50 MG tablet Take 50 mg by mouth daily.  Reported on 07/20/2015 07/03/15  Yes [provider]  sertraline (ZOLOFT) 100 MG tablet Take 100 mg by mouth daily.   Yes [provider]  sertraline (ZOLOFT) 50 MG tablet Take 50 mg by mouth daily.   Yes [provider]    Family History Family History  Problem Relation Age of Onset  . Heart failure Mother   . Heart attack Father   . Hypertension Maternal Grandmother   . Heart attack Paternal Grandfather   . Breast cancer Maternal Aunt     Social History Social History   Tobacco Use  . Smoking status: Never Smoker  . Smokeless tobacco: Never Used  Substance Use Topics  . Alcohol use: No  . Drug use: No     Allergies   Epinephrine and Sulfa antibiotics   Review of Systems Review of Systems  Constitutional: Negative for fever.  Respiratory: Positive for cough and shortness of breath.   Cardiovascular: Negative for chest pain, palpitations and leg swelling.  Gastrointestinal: Negative for abdominal pain and nausea.  All other systems reviewed and are negative.    Physical Exam Updated Vital Signs BP 121/78   Pulse (!) 102   Temp 97.6 F (36.4 C)   Resp (!) 22   Ht 5\' 3"  (1.6 m)   Wt 83.9 kg (185 lb)   SpO2 94%   BMI 32.77 kg/m   Physical Exam  Constitutional: She appears well-developed  and well-nourished. She does not appear ill. No distress.  HENT:  Head: Normocephalic and atraumatic.  Mouth/Throat: Oropharynx is clear and moist.  Eyes: Conjunctivae are normal.  Neck: Normal range of motion. Neck supple.  Cardiovascular: Normal heart sounds and intact distal pulses. An irregularly irregular rhythm present. Tachycardia present.  Pulmonary/Chest: Effort normal and breath sounds normal. No respiratory distress.  Abdominal: Soft. Bowel sounds are normal. She exhibits no distension. There is no tenderness.  Musculoskeletal:  No LE edema or tenderness.  Neurological: She is alert.  Skin: Skin is warm.  Psychiatric: She has a normal  mood and affect. Her behavior is normal.  Nursing note and vitals reviewed.    ED Treatments / Results  Labs (all labs ordered are listed, but only abnormal results are displayed) Labs Reviewed  BASIC METABOLIC PANEL - Abnormal; Notable for the following components:      Result Value   Glucose, Bld 129 (*)    All other components within normal limits  CBC - Abnormal; Notable for the following components:   RBC 5.16 (*)    RDW 15.6 (*)    All other components within normal limits  BRAIN NATRIURETIC PEPTIDE - Abnormal; Notable for the following components:   B Natriuretic Peptide 387.9 (*)    All other components within normal limits  I-STAT TROPONIN, ED    EKG  EKG Interpretation  Date/Time:  Thursday February 20 2017 11:18:29 EST Ventricular Rate:  145 PR Interval:    QRS Duration: 78 QT Interval:  351 QTC Calculation: 546 R Axis:   118 Text Interpretation:  Atrial fibrillation Right axis deviation Borderline abnrm T, anterolateral leads Prolonged QT interval Since previous tracing Atrial fibrillation with RVR is new Confirmed by Alfonzo Beers [phone]) on 02/20/2017 11:49:30 AM       Radiology Dg Chest 2 View  Result Date: 02/20/2017 CLINICAL DATA:  Increasing shortness of breath for 3 days EXAM: CHEST  2 VIEW COMPARISON:  02/01/2009 FINDINGS: Cardiac shadow is enlarged. The lungs are well aerated bilaterally. Some atelectatic changes are noted in the left lung base. Small left-sided pleural effusion is noted. No other focal infiltrate is seen. No acute bony abnormality is seen. IMPRESSION: Mild left basilar atelectasis and small effusion. Electronically Signed   By: Inez Catalina M.D.   On: 02/20/2017 12:06   Ct Angio Chest Pe W/cm &/or Wo Cm  Result Date: 02/20/2017 CLINICAL DATA:  72 year old female with chest pain and tightness since 10 p.m. yesterday evening. Shortness of breath. EXAM: CT ANGIOGRAPHY CHEST WITH CONTRAST TECHNIQUE: Multidetector CT imaging of the chest  was performed using the standard protocol during bolus administration of intravenous contrast. Multiplanar CT image reconstructions and MIPs were obtained to evaluate the vascular anatomy. CONTRAST:  120mL ISOVUE-370 IOPAMIDOL (ISOVUE-370) INJECTION 76% COMPARISON:  No priors. FINDINGS: Cardiovascular: No filling defects in the pulmonary arterial tree to suggest pulmonary embolism. Heart size is mildly enlarged with left atrial dilatation. Small amount of pericardial fluid and/or thickening, unlikely to be of any hemodynamic significance at this time. No associated pericardial calcification. There is aortic atherosclerosis, as well as atherosclerosis of the great vessels of the mediastinum and the coronary arteries, including calcified atherosclerotic plaque in the left anterior descending, left circumflex and right coronary arteries. Severe calcifications of the mitral annulus. Mediastinum/Nodes: No pathologically enlarged mediastinal or hilar lymph nodes. Esophagus is unremarkable in appearance. No axillary lymphadenopathy. Lungs/Pleura: Moderate right and small left pleural effusions lying dependently. Some associated passive subsegmental atelectasis in the  dependent portion of the right lower lobe. No consolidative airspace disease. Diffuse but patchy areas of ground-glass attenuation and interlobular septal thickening, most compatible with a background of mild interstitial pulmonary edema. A few scattered tiny pulmonary nodules are noted, largest of which measures 5 mm in the right middle lobe (axial image 41 of series 7). Upper Abdomen: Aortic atherosclerosis. Musculoskeletal: There are no aggressive appearing lytic or blastic lesions noted in the visualized portions of the skeleton. Review of the MIP images confirms the above findings. IMPRESSION: 1. No evidence of pulmonary embolism. 2. The appearance of the chest suggests congestive heart failure, including a background of interstitial pulmonary edema, as  well as moderate right and small left pleural effusions. 3. Cardiomegaly with left atrial dilatation. 4. Aortic atherosclerosis, in addition to three-vessel coronary artery disease. Assessment for potential risk factor modification, dietary therapy or pharmacologic therapy may be warranted, if clinically indicated. 5. There are calcifications of the mitral annulus. Echocardiographic correlation for evaluation of potential valvular dysfunction may be warranted if clinically indicated. Aortic Atherosclerosis (ICD10-I70.0). Electronically Signed   By: Vinnie Langton M.D.   On: 02/20/2017 13:58    Procedures Procedures (including critical care time) CRITICAL CARE Performed by: [place] N Robinson   Total critical care time: 30 minutes  Critical care time was exclusive of separately billable procedures and treating other patients.  Critical care was necessary to treat or prevent imminent or life-threatening deterioration.  Critical care was time spent personally by me on the following activities: development of treatment plan with patient and/or surrogate as well as nursing, discussions with consultants, evaluation of patient's response to treatment, examination of patient, obtaining history from patient or surrogate, ordering and performing treatments and interventions, ordering and review of laboratory studies, ordering and review of radiographic studies, pulse oximetry and re-evaluation of patient's condition.   Medications Ordered in ED Medications  diltiazem (CARDIZEM) 1 mg/mL load via infusion 10 mg (10 mg Intravenous Bolus from Bag 02/20/17 1211)    And  diltiazem (CARDIZEM) 100 mg in dextrose 5% 162mL (1 mg/mL) infusion (7.5 mg/hr Intravenous Rate/Dose Change 02/20/17 1758)  iopamidol (ISOVUE-370) 76 % injection (100 mLs  Contrast Given 02/20/17 1326)  furosemide (LASIX) injection 40 mg (40 mg Intravenous Given 02/20/17 1523)  LORazepam (ATIVAN) injection 0.5 mg (0.5 mg Intravenous Given  02/20/17 1716)     Initial Impression / Assessment and Plan / ED Course  I have reviewed the triage vital signs and the nursing notes.  Pertinent labs & imaging results that were available during my care of the patient were reviewed by me and considered in my medical decision making (see chart for details).  Clinical Course as of Feb 20 1957  Thu Feb 20, 2017  1446 Discussed results with patient. Also discussed plan for BNP, trop and cardiology consult.  Pt rate controlled on diltiazem.   [JR]  [id number] Cardiology PA evaluated patient. Recommends medical admission.  [JR]  1958 Dr. Blaine Hamper with [facility] accepting admission.  [JR]    Clinical Course User Index [JR] Robinson, [place] N, PA-C    Patient presenting for shortness of breath on exertion and laying flat times 1 week.  Patient also stopped taking her Eliquis, which she takes for chronic A. Fib, 10 days ago of her own volition.  On exam, EKG showing atrial fibrillation with rapid ventricular rate. Patient is well-appearing, not in distress.  Lungs clear to auscultation bilaterally.    Diltiazem drip initiated for rate control, with improvement. CBC and BMP  unremarkable.  Patient's respiratory symptoms consistent with use onset of CHF versus the possibility of PE.  Given patient's recent missed doses of Eliquis for greater than 1 week, CTA of the chest was done to rule out PE.  CTA negative for PE, however showing results consistent with CHF.  BNP 387.9.  Troponin negative.  Cardiology consulted for admission; recommending medical admission.  Dr. Blaine Hamper with [facility] accepting admission.  Patient discussed with and seen by Dr. Marcha Dutton.  The patient appears reasonably stabilized for admission considering the current resources, flow, and capabilities available in the ED at this time, and I doubt any other [facility] requiring further screening and/or treatment in the ED prior to admission.  Final Clinical Impressions(s) / ED Diagnoses   Final diagnoses:  SOB  (shortness of breath)  Atrial fibrillation with rapid ventricular response [facility])    ED Discharge Orders    None       Robinson, [place] N, PA-C 02/20/17 1959    Pixie Casino, MD 03/07/17 [phone]

## 2017-02-20 NOTE — H&P (Addendum)
History and Physical    Beth Haynes XBD:[id number] DOB: June 07, 1945 DOA: 02/20/2017  Referring MD/NP/PA:   PCP: Velna Hatchet, MD   Patient coming from:  The patient is coming from home.  At baseline, pt is independent for most of ADL.   Chief Complaint: SOB  HPI: Beth Haynes is a 72 y.o. female with medical history significant of dCHF, hypertension, depression, OSA on CPAP, atrial fibrillation on Eliquis, but not compliant), IBS, urinary incontinence, panic attack, who presents with shortness of breath.    patient states that she has been having shortness of breath for several weeks, which has worsened in the past 3 days. She has mild dry cough, no chest pain, fever or chills. Pt does not have nausea, vomiting, diarrhea or abdominal pain. Patient states that she has dysuria or mild burning on urination sometimes. She went to see PCP and was found to have a fib with RVR, and sent to ED for further evaluation and treatment. She was given IV metoprolol. Pt is on Eliquis, but not fully compliant. She states that she missed several doses recently. On interview, pt is very tangential and doesn't seem to have a good understanding of what is going on. She is oriented to place and person, but not to time. She seems to have undiagnosed dementia.   ED Course: pt was found to have  BNP 387, WBC 6.4, electrolytes renal function okay, tachycardia, tachypnea, O2 sat are 92-94% on room air. Chest x-ray showed mild left atelectasis and small pleural effusion. CT angiogram is negative for PE, but showed CHF. Patient is placed on SDU for obs. Card was consulted and IV cardizem gtt was started.  Review of Systems:   General: no fevers, chills, no body weight gain, has fatigue HEENT: no blurry vision, hearing changes or sore throat Respiratory: has dyspnea, coughing, no wheezing CV: no chest pain, no palpitations GI: no nausea, vomiting, abdominal pain, diarrhea, constipation GU: no dysuria, burning on  urination, increased urinary frequency, hematuria  Ext: has leg edema Neuro: no unilateral weakness, numbness, or tingling, no vision change or hearing loss Skin: no rash, no skin tear. MSK: No muscle spasm, no deformity, no limitation of range of movement in spin Heme: No easy bruising.  Travel history: No recent long distant travel.  Allergy:  Allergies  Allergen Reactions  . Epinephrine     Panic attacks   . Sulfa Antibiotics     Childhood allergy     Past Medical History:  Diagnosis Date  . Atrial fibrillation ([place])   . IBS (irritable bowel syndrome)   . Obesity (BMI 30-39.9) 07/11/2015  . OSA (obstructive sleep apnea) 01/17/2015   Severe with AHI 35.8/hr  . Panic attacks   . Urinary incontinence, nocturnal enuresis     Past Surgical History:  Procedure Laterality Date  . TONSILLECTOMY      Social History:  reports that  has never smoked. she has never used smokeless tobacco. She reports that she does not drink alcohol or use drugs.  Family History:  Family History  Problem Relation Age of Onset  . Heart failure Mother   . Heart attack Father   . Hypertension Maternal Grandmother   . Heart attack Paternal Grandfather   . Breast cancer Maternal Aunt      Prior to Admission medications   Medication Sig Start Date End Date Taking? Authorizing Provider  apixaban (ELIQUIS) 5 MG TABS tablet Take 1 tablet (5 mg total) by mouth 2 (two) times  daily. 11/21/16 05/20/17 Yes Nahser, Wonda Cheng, MD  losartan (COZAAR) 50 MG tablet Take 50 mg by mouth daily. Reported on 07/20/2015 07/03/15  Yes [provider]  sertraline (ZOLOFT) 100 MG tablet Take 100 mg by mouth daily.   Yes [provider]  sertraline (ZOLOFT) 50 MG tablet Take 50 mg by mouth daily.   Yes [provider]    Physical Exam: Vitals:   02/20/17 1500 02/20/17 1530 02/20/17 1758 02/20/17 1932  BP: 121/76 (!) 149/114 (!) 157/112 121/78  Pulse: 100 (!) 102 (!) 110 (!) 102  Resp: 20 17 20   (!) 22  Temp:      SpO2: 92% 95% 95% 94%  Weight:      Height:       General: Not in acute distress HEENT:       Eyes: PERRL, EOMI, no scleral icterus.       ENT: No discharge from the ears and nose, no pharynx injection, no tonsillar enlargement.        Neck: No JVD, no bruit, no mass felt. Heme: No neck lymph node enlargement. Cardiac: S1/S2, irregularly irregular rhythm, No murmurs, No gallops or rubs. Respiratory: No rales, wheezing, rhonchi or rubs. GI: Soft, nondistended, nontender, no rebound pain, no organomegaly, BS present. GU: No hematuria Ext: 1+ pitting leg edema bilaterally. 2+DP/PT pulse bilaterally. Musculoskeletal: No joint deformities, No joint redness or warmth, no limitation of ROM in spin. Skin: No rashes.  Neuro: Alert, oriented to place and person, but to time, cranial nerves II-XII grossly intact, moves all extremities normally.  Psych: Patient is not psychotic, no suicidal or hemocidal ideation.  Labs on Admission: I have personally reviewed following labs and imaging studies  CBC: Recent Labs  Lab 02/20/17 1142  WBC 6.4  HGB 14.4  HCT 45.9  MCV 89.0  PLT 440   Basic Metabolic Panel: Recent Labs  Lab 02/20/17 1142  NA 142  K 3.8  CL 108  CO2 24  GLUCOSE 129*  BUN 14  CREATININE 0.75  CALCIUM 9.8   GFR: Estimated Creatinine Clearance: 65.2 mL/min (by C-G formula based on SCr of 0.75 mg/dL). Liver Function Tests: No results for input(s): AST, ALT, ALKPHOS, BILITOT, PROT, ALBUMIN in the last 168 hours. No results for input(s): LIPASE, AMYLASE in the last 168 hours. No results for input(s): AMMONIA in the last 168 hours. Coagulation Profile: No results for input(s): INR, PROTIME in the last 168 hours. Cardiac Enzymes: No results for input(s): CKTOTAL, CKMB, CKMBINDEX, TROPONINI in the last 168 hours. BNP (last 3 results) No results for input(s): PROBNP in the last 8760 hours. HbA1C: No results for input(s): HGBA1C in the last 72  hours. CBG: No results for input(s): GLUCAP in the last 168 hours. Lipid Profile: No results for input(s): CHOL, HDL, LDLCALC, TRIG, CHOLHDL, LDLDIRECT in the last 72 hours. Thyroid Function Tests: No results for input(s): TSH, T4TOTAL, FREET4, T3FREE, THYROIDAB in the last 72 hours. Anemia Panel: No results for input(s): VITAMINB12, FOLATE, FERRITIN, TIBC, IRON, RETICCTPCT in the last 72 hours. Urine analysis:    Component Value Date/Time   COLORURINE YELLOW 07/13/2016 1200   APPEARANCEUR CLEAR 07/13/2016 1200   LABSPEC 1.005 07/13/2016 1200   PHURINE 7.0 07/13/2016 1200   GLUCOSEU NEGATIVE 07/13/2016 1200   HGBUR NEGATIVE 07/13/2016 1200   BILIRUBINUR NEGATIVE 07/13/2016 1200   KETONESUR NEGATIVE 07/13/2016 1200   PROTEINUR NEGATIVE 07/13/2016 1200   UROBILINOGEN 0.2 10/10/2014 1812   NITRITE NEGATIVE 07/13/2016 1200  LEUKOCYTESUR MODERATE (A) 07/13/2016 1200   Sepsis Labs: @LABRCNTIP (procalcitonin:4,lacticidven:4) )No results found for this or any previous visit (from the past 240 hour(s)).   Radiological Exams on Admission: Dg Chest 2 View  Result Date: 02/20/2017 CLINICAL DATA:  Increasing shortness of breath for 3 days EXAM: CHEST  2 VIEW COMPARISON:  02/01/2009 FINDINGS: Cardiac shadow is enlarged. The lungs are well aerated bilaterally. Some atelectatic changes are noted in the left lung base. Small left-sided pleural effusion is noted. No other focal infiltrate is seen. No acute bony abnormality is seen. IMPRESSION: Mild left basilar atelectasis and small effusion. Electronically Signed   By: Inez Catalina M.D.   On: 02/20/2017 12:06   Ct Angio Chest Pe W/cm &/or Wo Cm  Result Date: 02/20/2017 CLINICAL DATA:  72 year old female with chest pain and tightness since 10 p.m. yesterday evening. Shortness of breath. EXAM: CT ANGIOGRAPHY CHEST WITH CONTRAST TECHNIQUE: Multidetector CT imaging of the chest was performed using the standard protocol during bolus administration of  intravenous contrast. Multiplanar CT image reconstructions and MIPs were obtained to evaluate the vascular anatomy. CONTRAST:  151mL ISOVUE-370 IOPAMIDOL (ISOVUE-370) INJECTION 76% COMPARISON:  No priors. FINDINGS: Cardiovascular: No filling defects in the pulmonary arterial tree to suggest pulmonary embolism. Heart size is mildly enlarged with left atrial dilatation. Small amount of pericardial fluid and/or thickening, unlikely to be of any hemodynamic significance at this time. No associated pericardial calcification. There is aortic atherosclerosis, as well as atherosclerosis of the great vessels of the mediastinum and the coronary arteries, including calcified atherosclerotic plaque in the left anterior descending, left circumflex and right coronary arteries. Severe calcifications of the mitral annulus. Mediastinum/Nodes: No pathologically enlarged mediastinal or hilar lymph nodes. Esophagus is unremarkable in appearance. No axillary lymphadenopathy. Lungs/Pleura: Moderate right and small left pleural effusions lying dependently. Some associated passive subsegmental atelectasis in the dependent portion of the right lower lobe. No consolidative airspace disease. Diffuse but patchy areas of ground-glass attenuation and interlobular septal thickening, most compatible with a background of mild interstitial pulmonary edema. A few scattered tiny pulmonary nodules are noted, largest of which measures 5 mm in the right middle lobe (axial image 41 of series 7). Upper Abdomen: Aortic atherosclerosis. Musculoskeletal: There are no aggressive appearing lytic or blastic lesions noted in the visualized portions of the skeleton. Review of the MIP images confirms the above findings. IMPRESSION: 1. No evidence of pulmonary embolism. 2. The appearance of the chest suggests congestive heart failure, including a background of interstitial pulmonary edema, as well as moderate right and small left pleural effusions. 3. Cardiomegaly  with left atrial dilatation. 4. Aortic atherosclerosis, in addition to three-vessel coronary artery disease. Assessment for potential risk factor modification, dietary therapy or pharmacologic therapy may be warranted, if clinically indicated. 5. There are calcifications of the mitral annulus. Echocardiographic correlation for evaluation of potential valvular dysfunction may be warranted if clinically indicated. Aortic Atherosclerosis (ICD10-I70.0). Electronically Signed   By: Vinnie Langton M.D.   On: 02/20/2017 13:58     EKG: Independently reviewed.  Atrial fibrillation, QTC 546, RAD, nonspecific T-wave change.  Assessment/Plan Principal Problem:   Atrial fibrillation with RVR (HCC) Active Problems:   HTN (hypertension)   Obstructive sleep apnea hypopnea, severe   Depression   Chronic diastolic CHF (congestive heart failure) (HCC)   Atrial fibrillation with RVR ([place]): CHA2DS2-VASc Score is 4 (age, sex, HTN, and dCHF), needs oral anticoagulation. Patient is Eliquis at home, but not fully compliant. Card was consulted. Dr. Oval Linsey and PA  Bhagat recommended Xarelto given that it is once per day patient may have better compliance.  - will place on SDU as inpt - highly appreciated cardiologist recommendation - continue cardizem gtt  - cycle CEs, every 6 hours 3. - 2-D echo  - TSH and Free T4 - UDS - RIsk stratify with FLP and A1c - will start Xarelto per pharm consult  Essential hypertension: -IV Hydralazine prn -Continue home medications: cozarr  Obstructive sleep apnea hypopnea, severe: -CPAP  Depression: -zolfot  Chronic diastolic CHF (congestive heart failure) ([place]): 2-D echo on 12/07/14 showed EF 60-65% with grade 2 diastolic dysfunction. Patient has 1+ leg edema, no JVD. BNP 387. CT angiogram showed CHF pattern, indicating patient may have mild CHF exacerbation. -will give one dose of lasix 40 mg x 1 now-->reevaluate her volume status -f/u 2d echo  Possible  undiagnosed dementia: -will consult to CM  -fall precaution   DVT ppx: on Xarelto Code Status: Full code Family Communication: None at bed side.    Disposition Plan:  Anticipate discharge back to previous home environment Consults called: Card, Dr. Haynes Dage and PA, Bhagat Admission status: SDU/card  Date of Service 02/20/2017    Ivor Costa [facility] Pager [phone]  If 7PM-7AM, please contact night-coverage [email or web address] Password [facility] 02/20/2017, 8:27 PM

## 2017-02-20 NOTE — ED Notes (Signed)
\  [id number]\

## 2017-02-21 ENCOUNTER — Observation Stay (HOSPITAL_BASED_OUTPATIENT_CLINIC_OR_DEPARTMENT_OTHER): Payer: Medicare Other

## 2017-02-21 ENCOUNTER — Encounter (HOSPITAL_COMMUNITY): Payer: Self-pay

## 2017-02-21 ENCOUNTER — Observation Stay (HOSPITAL_COMMUNITY): Payer: Medicare Other

## 2017-02-21 DIAGNOSIS — I4891 Unspecified atrial fibrillation: Secondary | ICD-10-CM

## 2017-02-21 DIAGNOSIS — I5032 Chronic diastolic (congestive) heart failure: Secondary | ICD-10-CM | POA: Diagnosis not present

## 2017-02-21 DIAGNOSIS — I10 Essential (primary) hypertension: Secondary | ICD-10-CM | POA: Diagnosis not present

## 2017-02-21 LAB — ECHOCARDIOGRAM COMPLETE
Ao-asc: 32 cm
E decel time: 162 msec
FS: 26 % — AB (ref 28–44)
HEIGHTINCHES: 63 in
IV/PV OW: 0.8
LA diam end sys: 59 mm
LA diam index: 3 cm/m2
LA vol A4C: 71.7 ml
LA vol: 86.5 mL
LASIZE: 59 mm
LAVOLIN: 44 mL/m2
LV PW d: 12.7 mm — AB (ref 0.6–1.1)
LVOT area: 2.54 cm2
LVOT diameter: 18 mm
MV Dec: 162
MV Peak grad: 8 mmHg
MVPKEVEL: 137 m/s
TAPSE: 16 mm
WEIGHTICAEL: 2960 [oz_av]

## 2017-02-21 LAB — LIPID PANEL
CHOL/HDL RATIO: 3.4 ratio
Cholesterol: 127 mg/dL (ref 0–200)
HDL: 37 mg/dL — ABNORMAL LOW (ref >40)
LDL Cholesterol: 77 mg/dL (ref 0–99)
Triglycerides: 66 mg/dL (ref <150)
VLDL: 13 mg/dL (ref 0–40)

## 2017-02-21 LAB — BASIC METABOLIC PANEL
Anion gap: 9 (ref 5–15)
BUN: 13 mg/dL (ref 6–20)
CALCIUM: 9.7 mg/dL (ref 8.9–10.3)
CO2: 25 mmol/L (ref 22–32)
CREATININE: 0.82 mg/dL (ref 0.44–1.00)
Chloride: 107 mmol/L (ref 101–111)
GFR calc Af Amer: >60 mL/min (ref >60)
GFR calc non Af Amer: >60 mL/min (ref >60)
Glucose, Bld: 99 mg/dL (ref 65–99)
POTASSIUM: 3.4 mmol/L — AB (ref 3.5–5.1)
SODIUM: 141 mmol/L (ref 135–145)

## 2017-02-21 LAB — CBC
HCT: 42.7 % (ref 36.0–46.0)
HEMOGLOBIN: 13.4 g/dL (ref 12.0–15.0)
MCH: 27.7 pg (ref 26.0–34.0)
MCHC: 31.4 g/dL (ref 30.0–36.0)
MCV: 88.2 fL (ref 78.0–100.0)
Platelets: 173 10*3/uL (ref 150–400)
RBC: 4.84 MIL/uL (ref 3.87–5.11)
RDW: 15.4 % (ref 11.5–15.5)
WBC: 4.9 10*3/uL (ref 4.0–10.5)

## 2017-02-21 LAB — HEMOGLOBIN A1C
HEMOGLOBIN A1C: 5.2 % (ref 4.8–5.6)
MEAN PLASMA GLUCOSE: 102.54 mg/dL

## 2017-02-21 LAB — TSH: TSH: 2.112 u[IU]/mL (ref 0.350–4.500)

## 2017-02-21 LAB — MRSA PCR SCREENING: MRSA by PCR: NEGATIVE

## 2017-02-21 LAB — TROPONIN I

## 2017-02-21 LAB — VITAMIN B12: VITAMIN B 12: 325 pg/mL (ref 180–914)

## 2017-02-21 MED ORDER — LORAZEPAM 2 MG/ML IJ SOLN
0.5000 mg | Freq: Once | INTRAMUSCULAR | Status: AC
  Administered 2017-02-21: 0.5 mg via INTRAVENOUS

## 2017-02-21 MED ORDER — DEXTROSE 5 % IV SOLN
1.0000 g | INTRAVENOUS | Status: DC
  Administered 2017-02-21 – 2017-02-23 (×3): 1 g via INTRAVENOUS
  Filled 2017-02-21 (×4): qty 10

## 2017-02-21 MED ORDER — DILTIAZEM HCL 60 MG PO TABS
90.0000 mg | ORAL_TABLET | Freq: Three times a day (TID) | ORAL | Status: DC
  Administered 2017-02-21 – 2017-02-22 (×3): 90 mg via ORAL
  Filled 2017-02-21 (×3): qty 1

## 2017-02-21 MED ORDER — LORAZEPAM 2 MG/ML IJ SOLN
INTRAMUSCULAR | Status: AC
  Administered 2017-02-21: 0.5 mg via INTRAVENOUS
  Filled 2017-02-21: qty 1

## 2017-02-21 NOTE — ED Notes (Signed)
Dr. Blaine Hamper notified of vital signs and patients decreased agitation. This RN advised to keep Diltiazem at 7.5 mg/hr.

## 2017-02-21 NOTE — Progress Notes (Signed)
Patietn has done well today. Patient able to visit with family and did not get anxious or  Confused.  Patient is more aware of her surroundings .

## 2017-02-21 NOTE — Progress Notes (Signed)
  Echocardiogram 2D Echocardiogram has been performed.  Randa Lynn Edison Nicholson 02/21/2017, 12:28 PM

## 2017-02-21 NOTE — ED Notes (Signed)
Telesitter at bedside.

## 2017-02-21 NOTE — Care Management Note (Addendum)
Case Management Note  Patient Details  Name: Beth Haynes MRN: [id number] Date of Birth: 09/03/1945  Subjective/Objective:  From home with spouse, pta indep, Presents with afib, confused, has sitter at bedside for safety was on amio drip, changed to po today, cont to monitor. She will benefit from pt eval when appropriate. She has not had [facility] services before . Will await pt eval.                Action/Plan: NCM will follow for dc needs.   Expected Discharge Date:                  Expected Discharge Plan:     In-House Referral:     Discharge planning Services  CM Consult  Post Acute Care Choice:    Choice offered to:     DME Arranged:    DME Agency:     HH Arranged:    HH Agency:     Status of Service:  In process, will continue to follow  If discussed at Long Length of Stay Meetings, dates discussed:    Additional Comments:  Zenon Mayo, RN 02/21/2017, 5:25 PM

## 2017-02-21 NOTE — Progress Notes (Signed)
Pharmacy Antibiotic Note  Beth Haynes is a 72 y.o. female admitted on 02/20/2017 with Afib.  Pharmacy has been consulted for ceftriaxone dosing.  Reports increased urinary frequency as well.   Plan: Start ceftriaxone 1g IV Q24h Consider short 3-5 day course based on cx results  Height: 5\' 3"  (160 cm) Weight: 185 lb (83.9 kg) IBW/kg (Calculated) : 52.4  Temp (24hrs), Avg:97.3 F (36.3 C), Min:97 F (36.1 C), Max:97.6 F (36.4 C)  Recent Labs  Lab 02/20/17 1142 02/21/17 0836  WBC 6.4 4.9  CREATININE 0.75  --     Estimated Creatinine Clearance: 65.2 mL/min (by C-G formula based on SCr of 0.75 mg/dL).    Allergies  Allergen Reactions  . Epinephrine     Panic attacks   . Sulfa Antibiotics     Childhood allergy     Thank you for allowing pharmacy to be a part of this patient's care.  Reginia Naas 02/21/2017 9:54 AM

## 2017-02-21 NOTE — Progress Notes (Signed)
PROGRESS NOTE [facility]   Beth Haynes   RKY:[id number] DOB: [birth year]  DOA: 02/20/2017 PCP: Velna Hatchet, MD   Brief Narrative:  Beth Haynes is a 72 y.o. female with medical history significant of dCHF, hypertension, depression, OSA on CPAP, atrial fibrillation on Eliquis, but not compliant), IBS, urinary incontinence, panic attack, who presents with shortness of breath. Patient was found to be on Afib with RVR admitted for Cardizem ggt.   Subjective: Patient seen and examined, she is somewhat confused, report that [place] is [place] the court [place], asking for grandchildren (she have none) HR better controlled.  Assessment & Plan: Afib with RVR  HR better controlled, but still sometimes above 100  Continue management per cardiology - cardizem   HTN  BP slight elevated  May need to add metoprolol  Hydralazine PRN   OSA  Continue CPAP   Chronic diastolic CHF  Seems to be euvolemic at this time  ECHO 55-60%   Delirium with dementia  I discussed with her husband, he report that she has been confuse for the past ~ 6 month, but this is more severe. He thinks that she has dementia. She is delirious in seeting of acute illness. Avoid benzos as this worsen the symptoms, will add haldol PRN, continue 1:1 sitter   Will need to r/o reversible causes of dementia - obtain TSH, PRP, HIV and B12, UA abnormal will continue to treat for now   Abnormal UA  Continue rocephin for now, as patient have delirium. Follow urine cultures  DVT prophylaxis: Xarelto  Code Status: Full code  Family Communication: Discussed with husband over phone  Disposition Plan: TBD   Consultants:   Cardiology   Procedures:   ECHO   Antimicrobials:  Rocephin 02/20/17   Objective: Vitals:   02/21/17 0732 02/21/17 0802 02/21/17 1254 02/21/17 1432  BP: (!) 157/145 (!) 148/91 (!) 142/93 118/88  Pulse: (!) 105  (!) 105   Resp: (!) 33  (!) 25   Temp: (!) 97.3 F (36.3 C)  (!) 97.5 F (36.4 C)     TempSrc: Axillary  Oral   SpO2: 97%  99%   Weight:      Height:        Intake/Output Summary (Last 24 hours) at 02/21/2017 1453 Last data filed at 02/21/2017 1413 Gross per 24 hour  Intake 123.5 ml  Output -  Net 123.5 ml   Filed Weights   02/20/17 1125  Weight: 83.9 kg (185 lb)    Examination:  General exam: NAD  HEENT: OP moist and clear Respiratory system: Good air entry, no wheezing, no crackles  Cardiovascular system: S1S2 Irr Irr no murmurs  Gastrointestinal system: Abdomen is nondistended, soft and nontender.  Central nervous system: Confuse, oriented x person and place Extremities: Trace b/l LE edema  Skin: No rashes, lesions or ulcers  Data Reviewed: I have personally reviewed following labs and imaging studies  CBC: Recent Labs  Lab 02/20/17 1142 02/21/17 0836  WBC 6.4 4.9  HGB 14.4 13.4  HCT 45.9 42.7  MCV 89.0 88.2  PLT 213 315   Basic Metabolic Panel: Recent Labs  Lab 02/20/17 1142 02/20/17 2008 02/21/17 0836  NA 142  --  141  K 3.8  --  3.4*  CL 108  --  107  CO2 24  --  25  GLUCOSE 129*  --  99  BUN 14  --  13  CREATININE 0.75  --  0.82  CALCIUM 9.8  --  9.7  MG  --  2.0  --    GFR: Estimated Creatinine Clearance: 63.6 mL/min (by C-G formula based on SCr of 0.82 mg/dL). Liver Function Tests: No results for input(s): AST, ALT, ALKPHOS, BILITOT, PROT, ALBUMIN in the last 168 hours. No results for input(s): LIPASE, AMYLASE in the last 168 hours. No results for input(s): AMMONIA in the last 168 hours. Coagulation Profile: No results for input(s): INR, PROTIME in the last 168 hours. Cardiac Enzymes: Recent Labs  Lab 02/20/17 2008 02/21/17 0836  TROPONINI <0.03 <0.03   BNP (last 3 results) No results for input(s): PROBNP in the last 8760 hours. HbA1C: Recent Labs    02/21/17 0836  HGBA1C 5.2   CBG: No results for input(s): GLUCAP in the last 168 hours. Lipid Profile: Recent Labs    02/21/17 0836  CHOL 127  HDL 37*   LDLCALC 77  TRIG 66  CHOLHDL 3.4   Thyroid Function Tests: Recent Labs    02/20/17 2008 02/21/17 0836  TSH  --  2.112  FREET4 1.03  --    Anemia Panel: No results for input(s): VITAMINB12, FOLATE, FERRITIN, TIBC, IRON, RETICCTPCT in the last 72 hours. Sepsis Labs: No results for input(s): PROCALCITON, LATICACIDVEN in the last 168 hours.  Recent Results (from the past 240 hour(s))  MRSA PCR Screening     Status: None   Collection Time: 02/21/17  6:35 AM  Result Value Ref Range Status   MRSA by PCR NEGATIVE NEGATIVE Final    Comment:        The GeneXpert MRSA Assay (FDA approved for NASAL specimens only), is one component of a comprehensive MRSA colonization surveillance program. It is not intended to diagnose MRSA infection nor to guide or monitor treatment for MRSA infections.       Radiology Studies: Dg Chest 2 View  Result Date: 02/20/2017 CLINICAL DATA:  Increasing shortness of breath for 3 days EXAM: CHEST  2 VIEW COMPARISON:  02/01/2009 FINDINGS: Cardiac shadow is enlarged. The lungs are well aerated bilaterally. Some atelectatic changes are noted in the left lung base. Small left-sided pleural effusion is noted. No other focal infiltrate is seen. No acute bony abnormality is seen. IMPRESSION: Mild left basilar atelectasis and small effusion. Electronically Signed   By: Inez Catalina M.D.   On: 02/20/2017 12:06   Ct Head Wo Contrast  Result Date: 02/21/2017 CLINICAL DATA:  Altered level of consciousness EXAM: CT HEAD WITHOUT CONTRAST TECHNIQUE: Contiguous axial images were obtained from the base of the skull through the vertex without intravenous contrast. COMPARISON:  MR brain 10/10/2014 FINDINGS: Brain: There is atrophy and chronic small vessel disease changes. Old right basal ganglia lacunar infarct. No acute intracranial abnormality. Specifically, no hemorrhage, hydrocephalus, mass lesion, acute infarction, or significant intracranial injury. Vascular: No  hyperdense vessel or unexpected calcification. Skull: No acute calvarial abnormality. Sinuses/Orbits: No acute finding. 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 No acute intracranial abnormality. Atrophy, chronic microvascular disease. Electronically Signed   By: Rolm Baptise M.D.   On: 02/21/2017 10:41   Ct Angio Chest Pe W/cm &/or Wo Cm  Result Date: 02/20/2017 CLINICAL DATA:  72 year old female with chest pain and tightness since 10 p.m. yesterday evening. Shortness of breath. EXAM: CT ANGIOGRAPHY CHEST WITH CONTRAST TECHNIQUE: Multidetector CT imaging of the chest was performed using the standard protocol during bolus administration of intravenous contrast. Multiplanar CT image reconstructions and MIPs were obtained to evaluate the vascular anatomy. CONTRAST:  185mL ISOVUE-370  IOPAMIDOL (ISOVUE-370) INJECTION 76% COMPARISON:  No priors. FINDINGS: Cardiovascular: No filling defects in the pulmonary arterial tree to suggest pulmonary embolism. Heart size is mildly enlarged with left atrial dilatation. Small amount of pericardial fluid and/or thickening, unlikely to be of any hemodynamic significance at this time. No associated pericardial calcification. There is aortic atherosclerosis, as well as atherosclerosis of the great vessels of the mediastinum and the coronary arteries, including calcified atherosclerotic plaque in the left anterior descending, left circumflex and right coronary arteries. Severe calcifications of the mitral annulus. Mediastinum/Nodes: No pathologically enlarged mediastinal or hilar lymph nodes. Esophagus is unremarkable in appearance. No axillary lymphadenopathy. Lungs/Pleura: Moderate right and small left pleural effusions lying dependently. Some associated passive subsegmental atelectasis in the dependent portion of the right lower lobe. No consolidative airspace disease. Diffuse but patchy areas of ground-glass attenuation and interlobular septal  thickening, most compatible with a background of mild interstitial pulmonary edema. A few scattered tiny pulmonary nodules are noted, largest of which measures 5 mm in the right middle lobe (axial image 41 of series 7). Upper Abdomen: Aortic atherosclerosis. Musculoskeletal: There are no aggressive appearing lytic or blastic lesions noted in the visualized portions of the skeleton. Review of the MIP images confirms the above findings. IMPRESSION: 1. No evidence of pulmonary embolism. 2. The appearance of the chest suggests congestive heart failure, including a background of interstitial pulmonary edema, as well as moderate right and small left pleural effusions. 3. Cardiomegaly with left atrial dilatation. 4. Aortic atherosclerosis, in addition to three-vessel coronary artery disease. Assessment for potential risk factor modification, dietary therapy or pharmacologic therapy may be warranted, if clinically indicated. 5. There are calcifications of the mitral annulus. Echocardiographic correlation for evaluation of potential valvular dysfunction may be warranted if clinically indicated. Aortic Atherosclerosis (ICD10-I70.0). Electronically Signed   By: Vinnie Langton M.D.   On: 02/20/2017 13:58      Scheduled Meds: . diltiazem  90 mg Oral Q8H  . losartan  50 mg Oral Daily  . rivaroxaban  20 mg Oral Q supper  . sertraline  100 mg Oral Daily   Continuous Infusions: . cefTRIAXone (ROCEPHIN)  IV 1 g (02/21/17 1435)     LOS: 0 days    Time spent: Total of 25 minutes spent with pt, greater than 50% of which was spent in discussion of  treatment, counseling and coordination of care   Chipper Oman, MD Pager: Text Page via [email or web address]   If 7PM-7AM, please contact night-coverage [email or web address] 02/21/2017, 2:53 PM

## 2017-02-21 NOTE — Progress Notes (Signed)
Progress Note  Patient Name: Beth Haynes Date of Encounter: 02/21/2017  Primary Cardiologist: Mertie Moores, MD  Subjective   Has been confused. [place] Has sitter by bedside. Pt currently sleeping.   Inpatient Medications    Scheduled Meds: . losartan  50 mg Oral Daily  . rivaroxaban  20 mg Oral Q supper  . sertraline  100 mg Oral Daily   Continuous Infusions: . diltiazem (CARDIZEM) infusion 7.5 mg/hr (02/21/17 0213)   PRN Meds: acetaminophen, ALPRAZolam, hydrALAZINE, hydrOXYzine, zolpidem   Vital Signs    Vitals:   02/21/17 0530 02/21/17 0640 02/21/17 0732 02/21/17 0802  BP: (!) 118/105 (!) 139/107  (!) 148/91  Pulse:  78 (!) 105   Resp: 20 (!) 22 (!) 33   Temp:  (!) 97 F (36.1 C) (!) 97.3 F (36.3 C)   TempSrc:  Axillary Axillary   SpO2: 96% 95% 97%   Weight:      Height:        Intake/Output Summary (Last 24 hours) at 02/21/2017 0916 Last data filed at 02/21/2017 0600 Gross per 24 hour  Intake 7.5 ml  Output -  Net 7.5 ml   Filed Weights   02/20/17 1125  Weight: 185 lb (83.9 kg)    Telemetry    Atrial fibrillation in the 90s - Personally Reviewed  ECG    Atrial fibrillation  - Personally Reviewed  Physical Exam   GEN: No acute distress.  confused  Neck: No JVD Cardiac: irreguarlly irregular rhythm, normal rate Respiratory: Clear to auscultation bilaterally. GI: Soft, nontender, non-distended  MS: No edema; No deformity. Neuro:  Nonfocal  Psych: Normal affect   Labs    Chemistry Recent Labs  Lab 02/20/17 1142  NA 142  K 3.8  CL 108  CO2 24  GLUCOSE 129*  BUN 14  CREATININE 0.75  CALCIUM 9.8  GFRNONAA >60  GFRAA >60  ANIONGAP 10     Hematology Recent Labs  Lab 02/20/17 1142 02/21/17 0836  WBC 6.4 4.9  RBC 5.16* 4.84  HGB 14.4 13.4  HCT 45.9 42.7  MCV 89.0 88.2  MCH 27.9 27.7  MCHC 31.4 31.4  RDW 15.6* 15.4  PLT 213 173    Cardiac Enzymes Recent Labs  Lab  02/20/17 2008  TROPONINI <0.03    Recent Labs  Lab 02/20/17 1515  TROPIPOC 0.01     BNP Recent Labs  Lab 02/20/17 1509  BNP 387.9*     DDimer No results for input(s): DDIMER in the last 168 hours.   Radiology    Dg Chest 2 View  Result Date: 02/20/2017 CLINICAL DATA:  Increasing shortness of breath for 3 days EXAM: CHEST  2 VIEW COMPARISON:  02/01/2009 FINDINGS: Cardiac shadow is enlarged. The lungs are well aerated bilaterally. Some atelectatic changes are noted in the left lung base. Small left-sided pleural effusion is noted. No other focal infiltrate is seen. No acute bony abnormality is seen. IMPRESSION: Mild left basilar atelectasis and small effusion. Electronically Signed   By: Inez Catalina M.D.   On: 02/20/2017 12:06   Ct Angio Chest Pe W/cm &/or Wo Cm  Result Date: 02/20/2017 CLINICAL DATA:  72 year old female with chest pain and tightness since 10 p.m. yesterday evening. Shortness of breath. EXAM: CT ANGIOGRAPHY CHEST WITH CONTRAST TECHNIQUE: Multidetector CT imaging of the chest was performed using the standard protocol during bolus administration of intravenous contrast. Multiplanar CT image reconstructions and MIPs were obtained to  evaluate the vascular anatomy. CONTRAST:  123mL ISOVUE-370 IOPAMIDOL (ISOVUE-370) INJECTION 76% COMPARISON:  No priors. FINDINGS: Cardiovascular: No filling defects in the pulmonary arterial tree to suggest pulmonary embolism. Heart size is mildly enlarged with left atrial dilatation. Small amount of pericardial fluid and/or thickening, unlikely to be of any hemodynamic significance at this time. No associated pericardial calcification. There is aortic atherosclerosis, as well as atherosclerosis of the great vessels of the mediastinum and the coronary arteries, including calcified atherosclerotic plaque in the left anterior descending, left circumflex and right coronary arteries. Severe calcifications of the mitral annulus. Mediastinum/Nodes: No  pathologically enlarged mediastinal or hilar lymph nodes. Esophagus is unremarkable in appearance. No axillary lymphadenopathy. Lungs/Pleura: Moderate right and small left pleural effusions lying dependently. Some associated passive subsegmental atelectasis in the dependent portion of the right lower lobe. No consolidative airspace disease. Diffuse but patchy areas of ground-glass attenuation and interlobular septal thickening, most compatible with a background of mild interstitial pulmonary edema. A few scattered tiny pulmonary nodules are noted, largest of which measures 5 mm in the right middle lobe (axial image 41 of series 7). Upper Abdomen: Aortic atherosclerosis. Musculoskeletal: There are no aggressive appearing lytic or blastic lesions noted in the visualized portions of the skeleton. Review of the MIP images confirms the above findings. IMPRESSION: 1. No evidence of pulmonary embolism. 2. The appearance of the chest suggests congestive heart failure, including a background of interstitial pulmonary edema, as well as moderate right and small left pleural effusions. 3. Cardiomegaly with left atrial dilatation. 4. Aortic atherosclerosis, in addition to three-vessel coronary artery disease. Assessment for potential risk factor modification, dietary therapy or pharmacologic therapy may be warranted, if clinically indicated. 5. There are calcifications of the mitral annulus. Echocardiographic correlation for evaluation of potential valvular dysfunction may be warranted if clinically indicated. Aortic Atherosclerosis (ICD10-I70.0). Electronically Signed   By: Vinnie Langton M.D.   On: 02/20/2017 13:58    Cardiac Studies   2D Echo pending   Patient Profile     Beth Haynes is a 72 y.o. female with a history of PAF, HTN, OSA and baseline dementia, who presented w/ DOE from PCP office and found to have afib RVR   Assessment & Plan    1. Atrial Fibrillation: rate improved with IV Cardizem, currently in  the 90s (was 145 on admit). Anticoagulant changed from Eliquis to Xarelto, given dementia and concerns regarding compliance with BID dosing. TSH is pending. Mg is WNL at 2.0. K normal on admit. Repeat BMP pending. Troponin negative x 2. 2D echo pending. UA negative for UTI. Chest CT negative for PE but noted signs of CHF, 3V CAD and left atrial dilation. A dose of IV Lasix was given earlier, 40 mg. For now, continue rate control management + NOAC for [place]. We will await echo results. If normal LVEF, will transition to PO Cardizem. If reduced LVEF, will transition to PO beta blocker. Although 3V CAD is noted on chest CT, I don't think she would be a good candidate for ischemic workup/ LHC given the degree of her dementia. Would recommend medical management. LDL is 77 mg/dL. Recommend starting statin therapy to get LDL < 70 mg/dL. Will discuss with MD addition of baby ASA. Continue medical management of HTN.    For questions or updates, please contact [place] Please consult [email or web address] for contact info under Cardiology/STEMI.      Signed, Lyda Jester, PA-C  02/21/2017, 9:16 AM

## 2017-02-21 NOTE — ED Notes (Addendum)
Beth Hamper, MD notified by this RN of pt agitated state and repeatedly attempting to take out IV and remove cardiac monitoring. Niu to place orders. This RN attempted to decrease stimulation and redirect prior to notifying Beth Hamper, MD. Confirmed with Dr. Blaine Haynes to keep Diltiazem at 7.5 mg/hr.

## 2017-02-21 NOTE — Care Management Obs Status (Signed)
[place] NOTIFICATION   Patient Details  Name: KAELEY VINJE MRN: [id number] Date of Birth: Jun 11, 1945   Medicare Observation Status Notification Given:  Yes    Zenon Mayo, RN 02/21/2017, 5:20 PM

## 2017-02-21 NOTE — ED Notes (Signed)
Pt O2 found to be at 91% on RA. Pt placed on 2L [place] and O2 is now 96%.

## 2017-02-21 NOTE — ED Notes (Signed)
Pt removed from 2L O2 and maintaining O2 sat of 98% on RA.

## 2017-02-22 ENCOUNTER — Other Ambulatory Visit: Payer: Self-pay

## 2017-02-22 DIAGNOSIS — Z8249 Family history of ischemic heart disease and other diseases of the circulatory system: Secondary | ICD-10-CM | POA: Diagnosis not present

## 2017-02-22 DIAGNOSIS — I110 Hypertensive heart disease with heart failure: Secondary | ICD-10-CM | POA: Diagnosis present

## 2017-02-22 DIAGNOSIS — I4819 Other persistent atrial fibrillation: Secondary | ICD-10-CM | POA: Diagnosis present

## 2017-02-22 DIAGNOSIS — Z7189 Other specified counseling: Secondary | ICD-10-CM | POA: Diagnosis not present

## 2017-02-22 DIAGNOSIS — I25118 Atherosclerotic heart disease of native coronary artery with other forms of angina pectoris: Secondary | ICD-10-CM

## 2017-02-22 DIAGNOSIS — I10 Essential (primary) hypertension: Secondary | ICD-10-CM | POA: Diagnosis not present

## 2017-02-22 DIAGNOSIS — G4733 Obstructive sleep apnea (adult) (pediatric): Secondary | ICD-10-CM | POA: Diagnosis present

## 2017-02-22 DIAGNOSIS — K589 Irritable bowel syndrome without diarrhea: Secondary | ICD-10-CM | POA: Diagnosis present

## 2017-02-22 DIAGNOSIS — I2510 Atherosclerotic heart disease of native coronary artery without angina pectoris: Secondary | ICD-10-CM | POA: Diagnosis not present

## 2017-02-22 DIAGNOSIS — I5032 Chronic diastolic (congestive) heart failure: Secondary | ICD-10-CM | POA: Diagnosis not present

## 2017-02-22 DIAGNOSIS — Z7901 Long term (current) use of anticoagulants: Secondary | ICD-10-CM | POA: Diagnosis not present

## 2017-02-22 DIAGNOSIS — I5031 Acute diastolic (congestive) heart failure: Secondary | ICD-10-CM | POA: Diagnosis not present

## 2017-02-22 DIAGNOSIS — F0390 Unspecified dementia without behavioral disturbance: Secondary | ICD-10-CM | POA: Diagnosis present

## 2017-02-22 DIAGNOSIS — N390 Urinary tract infection, site not specified: Secondary | ICD-10-CM | POA: Diagnosis present

## 2017-02-22 DIAGNOSIS — I4891 Unspecified atrial fibrillation: Secondary | ICD-10-CM | POA: Diagnosis not present

## 2017-02-22 DIAGNOSIS — F410 Panic disorder [episodic paroxysmal anxiety] without agoraphobia: Secondary | ICD-10-CM | POA: Diagnosis present

## 2017-02-22 DIAGNOSIS — Z9114 Patient's other noncompliance with medication regimen: Secondary | ICD-10-CM | POA: Diagnosis not present

## 2017-02-22 DIAGNOSIS — I5033 Acute on chronic diastolic (congestive) heart failure: Secondary | ICD-10-CM | POA: Diagnosis present

## 2017-02-22 DIAGNOSIS — R0602 Shortness of breath: Secondary | ICD-10-CM | POA: Diagnosis present

## 2017-02-22 DIAGNOSIS — F329 Major depressive disorder, single episode, unspecified: Secondary | ICD-10-CM | POA: Diagnosis present

## 2017-02-22 DIAGNOSIS — I480 Paroxysmal atrial fibrillation: Secondary | ICD-10-CM | POA: Diagnosis present

## 2017-02-22 LAB — URINE CULTURE

## 2017-02-22 LAB — RPR: RPR Ser Ql: NONREACTIVE

## 2017-02-22 LAB — HIV ANTIBODY (ROUTINE TESTING W REFLEX): HIV Screen 4th Generation wRfx: NONREACTIVE

## 2017-02-22 MED ORDER — ROSUVASTATIN CALCIUM 10 MG PO TABS
5.0000 mg | ORAL_TABLET | Freq: Every day | ORAL | Status: DC
  Administered 2017-02-23: 5 mg via ORAL
  Filled 2017-02-22: qty 1

## 2017-02-22 MED ORDER — DILTIAZEM HCL ER COATED BEADS 180 MG PO CP24
360.0000 mg | ORAL_CAPSULE | Freq: Every day | ORAL | Status: DC
  Administered 2017-02-22 – 2017-02-23 (×2): 360 mg via ORAL
  Filled 2017-02-22 (×2): qty 2

## 2017-02-22 MED ORDER — POTASSIUM CHLORIDE CRYS ER 20 MEQ PO TBCR
40.0000 meq | EXTENDED_RELEASE_TABLET | Freq: Once | ORAL | Status: AC
  Administered 2017-02-22: 40 meq via ORAL
  Filled 2017-02-22: qty 2

## 2017-02-22 NOTE — Progress Notes (Signed)
PROGRESS NOTE [facility]   Beth Haynes   IOX:[id number] DOB: 01/06/1946  DOA: 02/20/2017 PCP: Velna Hatchet, MD   Brief Narrative:  Beth Haynes is a 72 y.o. female with medical history significant of dCHF, hypertension, depression, OSA on CPAP, atrial fibrillation on Eliquis, but not compliant), IBS, urinary incontinence, panic attack, who presents with shortness of breath. Patient was found to be on Afib with RVR admitted for Cardizem ggt.   Subjective: Patient seen and examined, more coherent today. Does not remember our conversation from yesterday. Want to go home. HR improved. Denies chest pain, SOB and palpitations   Assessment & Plan: Afib with RVR  HR improved at times above 100 Cardizem per cardiology  Continue Xarelto   HTN  BP improved  Hydralazine PRN   OSA  Continue CPAP   Acute on chronic diastolic CHF  By CT signs of pulmonary edema She seem to be clinically compensated, no need for diuretics at this time  Continue to monitor   Delirium with dementia - Improved  CT head negative for acute findings  Reversible causes w/u negative  Improve with ? abx for ? UTI  May benefit from donepezil defer to outpatient   Abnormal UA  Will treat with rocephin x 3 day, as patient delirium has resolve with therapy Urine cultures multiple species   DVT prophylaxis: Xarelto  Code Status: Full code  Family Communication: Discussed with husband over phone  Disposition Plan: TBD   Consultants:   Cardiology   Procedures:   ECHO   Antimicrobials:  Rocephin 02/20/17   Objective: Vitals:   02/22/17 0600 02/22/17 0616 02/22/17 0800 02/22/17 1213  BP: 137/83 137/83 (!) 150/94 120/82  Pulse: 93  (!) 103 76  Resp: (!) 21  (!) 24 (!) 23  Temp:   (!) 97.3 F (36.3 C) (!) 97.3 F (36.3 C)  TempSrc:   Oral Oral  SpO2: 99%  95% 96%  Weight:      Height:        Intake/Output Summary (Last 24 hours) at 02/22/2017 1631 Last data filed at 02/22/2017  1200 Gross per 24 hour  Intake 910 ml  Output -  Net 910 ml   Filed Weights   02/20/17 1125 02/22/17 0438  Weight: 83.9 kg (185 lb) 84.4 kg (186 lb 1.1 oz)    Examination:  General: Pt is alert, awake, not in acute distress Cardiovascular: Irr Irr, S1/S2 +, no rubs, no gallops Respiratory: CTA bilaterally, no wheezing, no rhonchi Abdominal: Soft, NT, ND, bowel sounds + Extremities: no edema, Neuro: AAOx3, non focal, tangential   Data Reviewed: I have personally reviewed following labs and imaging studies  CBC: Recent Labs  Lab 02/20/17 1142 02/21/17 0836  WBC 6.4 4.9  HGB 14.4 13.4  HCT 45.9 42.7  MCV 89.0 88.2  PLT 213 268   Basic Metabolic Panel: Recent Labs  Lab 02/20/17 1142 02/20/17 2008 02/21/17 0836  NA 142  --  141  K 3.8  --  3.4*  CL 108  --  107  CO2 24  --  25  GLUCOSE 129*  --  99  BUN 14  --  13  CREATININE 0.75  --  0.82  CALCIUM 9.8  --  9.7  MG  --  2.0  --    GFR: Estimated Creatinine Clearance: 63.8 mL/min (by C-G formula based on SCr of 0.82 mg/dL). Liver Function Tests: No results for input(s): AST, ALT, ALKPHOS, BILITOT, PROT, ALBUMIN in the  last 168 hours. No results for input(s): LIPASE, AMYLASE in the last 168 hours. No results for input(s): AMMONIA in the last 168 hours. Coagulation Profile: No results for input(s): INR, PROTIME in the last 168 hours. Cardiac Enzymes: Recent Labs  Lab 02/20/17 2008 02/21/17 0836  TROPONINI <0.03 <0.03   BNP (last 3 results) No results for input(s): PROBNP in the last 8760 hours. HbA1C: Recent Labs    02/21/17 0836  HGBA1C 5.2   CBG: No results for input(s): GLUCAP in the last 168 hours. Lipid Profile: Recent Labs    02/21/17 0836  CHOL 127  HDL 37*  LDLCALC 77  TRIG 66  CHOLHDL 3.4   Thyroid Function Tests: Recent Labs    02/20/17 2008 02/21/17 0836  TSH  --  2.112  FREET4 1.03  --    Anemia Panel: Recent Labs    02/21/17 1300  VITAMINB12 325   Sepsis Labs: No  results for input(s): PROCALCITON, LATICACIDVEN in the last 168 hours.  Recent Results (from the past 240 hour(s))  Urine Culture     Status: Abnormal   Collection Time: 02/20/17  8:58 PM  Result Value Ref Range Status   Specimen Description URINE, RANDOM  Final   Special Requests NONE  Final   Culture MULTIPLE SPECIES PRESENT, SUGGEST RECOLLECTION (A)  Final   Report Status 02/22/2017 FINAL  Final  MRSA PCR Screening     Status: None   Collection Time: 02/21/17  6:35 AM  Result Value Ref Range Status   MRSA by PCR NEGATIVE NEGATIVE Final    Comment:        The GeneXpert MRSA Assay (FDA approved for NASAL specimens only), is one component of a comprehensive MRSA colonization surveillance program. It is not intended to diagnose MRSA infection nor to guide or monitor treatment for MRSA infections.       Radiology Studies: Ct Head Wo Contrast  Result Date: 02/21/2017 CLINICAL DATA:  Altered level of consciousness EXAM: CT HEAD WITHOUT CONTRAST TECHNIQUE: Contiguous axial images were obtained from the base of the skull through the vertex without intravenous contrast. COMPARISON:  MR brain 10/10/2014 FINDINGS: Brain: There is atrophy and chronic small vessel disease changes. Old right basal ganglia lacunar infarct. No acute intracranial abnormality. Specifically, no hemorrhage, hydrocephalus, mass lesion, acute infarction, or significant intracranial injury. Vascular: No hyperdense vessel or unexpected calcification. Skull: No acute calvarial abnormality. Sinuses/Orbits: No acute finding. 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 No acute intracranial abnormality. Atrophy, chronic microvascular disease. Electronically Signed   By: Rolm Baptise M.D.   On: 02/21/2017 10:41    Scheduled Meds: . diltiazem  360 mg Oral Daily  . losartan  50 mg Oral Daily  . potassium chloride  40 mEq Oral Once  . rivaroxaban  20 mg Oral Q supper  . [START ON 02/23/2017]  rosuvastatin  5 mg Oral Daily  . sertraline  100 mg Oral Daily   Continuous Infusions: . cefTRIAXone (ROCEPHIN)  IV Stopped (02/22/17 1000)     LOS: 0 days    Time spent: Total of 25 minutes spent with pt, greater than 50% of which was spent in discussion of  treatment, counseling and coordination of care   Chipper Oman, MD Pager: Text Page via [email or web address]   If 7PM-7AM, please contact night-coverage [email or web address] 02/22/2017, 4:31 PM

## 2017-02-22 NOTE — Progress Notes (Signed)
Pt resting comfortably- no distress.  Pt has a hx of non-compliance with use of CPAP at home.  CPAP available if needed for patient use.  RN aware.  Sitter at bedside.

## 2017-02-22 NOTE — Progress Notes (Signed)
Progress Note  Patient Name: Beth Haynes Date of Encounter: 02/22/2017  Primary Cardiologist: Mertie Moores, MD   Subjective   Denies chest pain, palpitations, leg swelling, and shortness of breath. Eager to go home.  Inpatient Medications    Scheduled Meds: . diltiazem  90 mg Oral Q8H  . losartan  50 mg Oral Daily  . potassium chloride  40 mEq Oral Once  . rivaroxaban  20 mg Oral Q supper  . sertraline  100 mg Oral Daily   Continuous Infusions: . cefTRIAXone (ROCEPHIN)  IV Stopped (02/22/17 1000)   PRN Meds: acetaminophen, ALPRAZolam, hydrALAZINE, hydrOXYzine, zolpidem   Vital Signs    Vitals:   02/22/17 0600 02/22/17 0616 02/22/17 0800 02/22/17 1213  BP: 137/83 137/83 (!) 150/94 120/82  Pulse: 93  (!) 103 76  Resp: (!) 21  (!) 24 (!) 23  Temp:   (!) 97.3 F (36.3 C) (!) 97.3 F (36.3 C)  TempSrc:   Oral Oral  SpO2: 99%  95% 96%  Weight:      Height:        Intake/Output Summary (Last 24 hours) at 02/22/2017 1512 Last data filed at 02/22/2017 1200 Gross per 24 hour  Intake 910 ml  Output -  Net 910 ml   Filed Weights   02/20/17 1125 02/22/17 0438  Weight: 185 lb (83.9 kg) 186 lb 1.1 oz (84.4 kg)    Telemetry    A fib (HR 90-120's) - Personally Reviewed  ECG    n/a - Personally Reviewed  Physical Exam   GEN: No acute distress.   Neck: No JVD Cardiac: Irregular rhythm, HR currently at upper normal limits, no murmurs, rubs, or gallops.  Respiratory: Clear to auscultation bilaterally. GI: Soft, nontender, non-distended  MS: No edema; No deformity. Neuro:  Nonfocal  Psych: Normal affect   Labs    Chemistry Recent Labs  Lab 02/20/17 1142 02/21/17 0836  NA 142 141  K 3.8 3.4*  CL 108 107  CO2 24 25  GLUCOSE 129* 99  BUN 14 13  CREATININE 0.75 0.82  CALCIUM 9.8 9.7  GFRNONAA >60 >60  GFRAA >60 >60  ANIONGAP 10 9     Hematology Recent Labs  Lab 02/20/17 1142 02/21/17 0836  WBC 6.4 4.9  RBC 5.16* 4.84  HGB 14.4 13.4  HCT  45.9 42.7  MCV 89.0 88.2  MCH 27.9 27.7  MCHC 31.4 31.4  RDW 15.6* 15.4  PLT 213 173    Cardiac Enzymes Recent Labs  Lab 02/20/17 2008 02/21/17 0836  TROPONINI <0.03 <0.03    Recent Labs  Lab 02/20/17 1515  TROPIPOC 0.01     BNP Recent Labs  Lab 02/20/17 1509  BNP 387.9*     DDimer No results for input(s): DDIMER in the last 168 hours.   Radiology    Ct Head Wo Contrast  Result Date: 02/21/2017 CLINICAL DATA:  Altered level of consciousness EXAM: CT HEAD WITHOUT CONTRAST TECHNIQUE: Contiguous axial images were obtained from the base of the skull through the vertex without intravenous contrast. COMPARISON:  MR brain 10/10/2014 FINDINGS: Brain: There is atrophy and chronic small vessel disease changes. Old right basal ganglia lacunar infarct. No acute intracranial abnormality. Specifically, no hemorrhage, hydrocephalus, mass lesion, acute infarction, or significant intracranial injury. Vascular: No hyperdense vessel or unexpected calcification. Skull: No acute calvarial abnormality. Sinuses/Orbits: No acute finding. 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 No acute intracranial abnormality. Atrophy, chronic microvascular disease. Electronically Signed  By: Rolm Baptise M.D.   On: 02/21/2017 10:41    Cardiac Studies   Echocardiogram (02/21/17):  - Left ventricle: The cavity size was normal. Systolic function was   normal. The estimated ejection fraction was in the range of 55%   to 60%. Wall motion was normal; there were no regional wall   motion abnormalities. The study was not technically sufficient to   allow evaluation of LV diastolic dysfunction due to atrial   fibrillation. - Aortic valve: Trileaflet; moderately thickened, mildly calcified   leaflets. There was trivial regurgitation. - Mitral valve: Calcified annulus. There was mild regurgitation. - Left atrium: The atrium was severely dilated. - Pulmonary arteries: Systolic pressure  could not be accurately   estimated.  Patient Profile     72 y.o. female with a history of PAF, HTN, OSA and baseline dementia, who presented w/ DOE from PCP office and found to have afib RVR  Assessment & Plan    1. Rapid atrial fibrillation: HR's currently 90-120 bpm range. I will switch short-acting diltiazem to long-acting 360 mg daily to consolidate therapy. Continue Xarelto 20 mg for feasibility and better chance of compliance.  2. Acute diastolic heart failure: Bilateral pleural effusions and interstitial pulmonary edema seen by CT angiogram on 02/20/17. O2 sats currently 96%. Lungs are clear. No need for diuretics at present.  3. HTN: BP is normal. Monitor given increase in diltiazem dose.  4. CAD: 3-vessel coronary artery calcifications noted by chest CT. I don't think she would be a good candidate for ischemic workup/ LHC given the degree of her dementia. No ASA as she is on Xarelto. I will start low dose statin (Crestor 5 mg) for its pleiotropic effects and to target a lower LDL.  Disposition: Should be close to discharge.  For questions or updates, please contact [place] Please consult [email or web address] for contact info under Cardiology/STEMI.      Signed, Kate Sable, MD  02/22/2017, 3:12 PM

## 2017-02-22 NOTE — Progress Notes (Signed)
Report called and given to [facility] Husband aware that pt is transferring.

## 2017-02-23 MED ORDER — DILTIAZEM HCL ER COATED BEADS 360 MG PO CP24: 360.0000 mg | ORAL_CAPSULE | Freq: Every day | ORAL | 0 refills | Status: DC

## 2017-02-23 MED ORDER — DONEPEZIL HCL 5 MG PO TABS: 5.0000 mg | ORAL_TABLET | Freq: Every day | ORAL | 0 refills | Status: DC

## 2017-02-23 MED ORDER — ROSUVASTATIN CALCIUM 5 MG PO TABS: 5.0000 mg | ORAL_TABLET | Freq: Every day | ORAL | 0 refills | Status: DC

## 2017-02-23 MED ORDER — RIVAROXABAN 20 MG PO TABS: 20.0000 mg | ORAL_TABLET | Freq: Every day | ORAL | 0 refills | Status: DC

## 2017-02-23 NOTE — Discharge Summary (Signed)
Physician Discharge Summary  Beth Haynes  RDE:[id number]  DOB: September 16, 1945  DOA: 02/20/2017 PCP: Velna Hatchet, MD  Admit date: 02/20/2017 Discharge date: 02/23/2017  Admitted From: Home  Disposition:  Home   Recommendations for Outpatient Follow-up:  1. Follow up with PCP in 1-2 weeks 2. Follow up with Cardiology in 2 weeks  3. Please obtain BMP/CBC in one week 4. Please follow up on the following pending results:  Discharge Condition: Stable  CODE STATUS: Full Code  Diet recommendation: Heart Healthy   Brief/Interim Summary: For full details see H&P/Progress note but in brief, Beth Haynes is a 72 y.o.femalewith medical history significant ofdCHF,hypertension, depression, OSA on CPAP, atrial fibrillationon Eliquis, but notcompliant), IBS, urinary incontinence, panic attack, who presents with shortness of breath. Patient was found to be on Afib with RVR admitted for Cardizem ggt. Also found to be delirious with possible UTI, patient was treated with IV rocephin. Cardiology was consulted, adjustment on medications where made. Patient mental status improved and patient clinically stable for discharge. Patient was also evaluated for dementia, lab work up negative, although CT of the head with some atrophy. Patient will follow up with PCP.   Subjective: Patient seen and examined, per husband mental status back to baseline. HR better controlled. Husband concern that she is not taking her medications at home. No acute events overnight. Denies chest pain, SOB and palpitations.   Discharge Diagnoses/Hospital Course:  Afib with RVR - Improved  Treated with Cardizem ggt transitioned to oral formulation - continue Cardizem CD 360 mg daily  Patient was switched from Eliquis to Xarelto to improve compliance  Follow up with Cardiology in 2 weeks   HTN  BP improved  Only on Cardizem will continue for now  Follow up with PCP   OSA  Continue CPAP   Acute on chronic diastolic CHF  By  CT signs of pulmonary edema She seem to be clinically compensated, no need for diuretics at this time   Delirium with dementia - Improved  CT head negative for acute findings - Chronic atrophy with microvascular changes - signs of dementia. Will start low dose Aricept and follow up with PCP Reversible causes w/u negative  Delirium resolved  with abx for UTI   UTI  Treated with Rocephin x 3 days  Urine cultures multiple species   All other chronic medical condition were stable during the hospitalization.  On the day of the discharge the patient's vitals were stable, and no other acute medical condition were reported by patient. the patient was felt safe to be discharge to home   Discharge Instructions  You were cared for by a hospitalist during your hospital stay. If you have any questions about your discharge medications or the care you received while you were in the hospital after you are discharged, you can call the unit and asked to speak with the hospitalist on call if the hospitalist that took care of you is not available. Once you are discharged, your primary care physician will handle any further medical issues. Please note that NO REFILLS for any discharge medications will be authorized once you are discharged, as it is imperative that you return to your primary care physician (or establish a relationship with a primary care physician if you do not have one) for your aftercare needs so that they can reassess your need for medications and monitor your lab values.  Discharge Instructions    Call MD for:  difficulty breathing, headache or visual disturbances  Complete by:  As directed    Call MD for:  extreme fatigue   Complete by:  As directed    Call MD for:  hives   Complete by:  As directed    Call MD for:  persistant dizziness or light-headedness   Complete by:  As directed    Call MD for:  persistant nausea and vomiting   Complete by:  As directed    Call MD for:  redness,  tenderness, or signs of infection (pain, swelling, redness, odor or green/yellow discharge around incision site)   Complete by:  As directed    Call MD for:  severe uncontrolled pain   Complete by:  As directed    Call MD for:  temperature >100.4   Complete by:  As directed    Diet - low sodium heart healthy   Complete by:  As directed    Increase activity slowly   Complete by:  As directed      Allergies as of 02/23/2017      Reactions   Epinephrine    Panic attacks   Sulfa Antibiotics    Childhood allergy       Medication List    STOP taking these medications   apixaban 5 MG Tabs tablet Commonly known as:  ELIQUIS     TAKE these medications   diltiazem 360 MG 24 hr capsule Commonly known as:  CARDIZEM CD Take 1 capsule (360 mg total) by mouth daily. Start taking on:  02/24/2017   donepezil 5 MG tablet Commonly known as:  ARICEPT Take 1 tablet (5 mg total) by mouth at bedtime.   losartan 50 MG tablet Commonly known as:  COZAAR Take 50 mg by mouth daily. Reported on 07/20/2015   rivaroxaban 20 MG Tabs tablet Commonly known as:  XARELTO Take 1 tablet (20 mg total) by mouth daily with supper.   rosuvastatin 5 MG tablet Commonly known as:  CRESTOR Take 1 tablet (5 mg total) by mouth daily. Start taking on:  02/24/2017   sertraline 100 MG tablet Commonly known as:  ZOLOFT Take 100 mg by mouth daily. What changed:  Another medication with the same name was removed. Continue taking this medication, and follow the directions you see here.      Follow-up Information    Velna Hatchet, MD. Schedule an appointment as soon as possible for a visit in 1 week(s).   Specialty:  Internal Medicine Why:  Hospitall follow up  Contact information: [place] [address] [phone]        Nahser, Wonda Cheng, MD. Schedule an appointment as soon as possible for a visit in 1 week(s).   Specialty:  Cardiology Why:  Hospital follow up  Contact  information: [address] [place] [address] [phone]          Allergies  Allergen Reactions  . Epinephrine     Panic attacks   . Sulfa Antibiotics     Childhood allergy     Consultations:  Cardiology    Procedures/Studies: Dg Chest 2 View  Result Date: 02/20/2017 CLINICAL DATA:  Increasing shortness of breath for 3 days EXAM: CHEST  2 VIEW COMPARISON:  02/01/2009 FINDINGS: Cardiac shadow is enlarged. The lungs are well aerated bilaterally. Some atelectatic changes are noted in the left lung base. Small left-sided pleural effusion is noted. No other focal infiltrate is seen. No acute bony abnormality is seen. IMPRESSION: Mild left basilar atelectasis and small effusion. Electronically  Signed   By: Inez Catalina M.D.   On: 02/20/2017 12:06   Ct Head Wo Contrast  Result Date: 02/21/2017 CLINICAL DATA:  Altered level of consciousness EXAM: CT HEAD WITHOUT CONTRAST TECHNIQUE: Contiguous axial images were obtained from the base of the skull through the vertex without intravenous contrast. COMPARISON:  MR brain 10/10/2014 FINDINGS: Brain: There is atrophy and chronic small vessel disease changes. Old right basal ganglia lacunar infarct. No acute intracranial abnormality. Specifically, no hemorrhage, hydrocephalus, mass lesion, acute infarction, or significant intracranial injury. Vascular: No hyperdense vessel or unexpected calcification. Skull: No acute calvarial abnormality. Sinuses/Orbits: No acute finding. 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 No acute intracranial abnormality. Atrophy, chronic microvascular disease. Electronically Signed   By: Rolm Baptise M.D.   On: 02/21/2017 10:41   Ct Angio Chest Pe W/cm &/or Wo Cm  Result Date: 02/20/2017 CLINICAL DATA:  72 year old female with chest pain and tightness since 10 p.m. yesterday evening. Shortness of breath. EXAM: CT ANGIOGRAPHY CHEST WITH CONTRAST TECHNIQUE: Multidetector CT  imaging of the chest was performed using the standard protocol during bolus administration of intravenous contrast. Multiplanar CT image reconstructions and MIPs were obtained to evaluate the vascular anatomy. CONTRAST:  113mL ISOVUE-370 IOPAMIDOL (ISOVUE-370) INJECTION 76% COMPARISON:  No priors. FINDINGS: Cardiovascular: No filling defects in the pulmonary arterial tree to suggest pulmonary embolism. Heart size is mildly enlarged with left atrial dilatation. Small amount of pericardial fluid and/or thickening, unlikely to be of any hemodynamic significance at this time. No associated pericardial calcification. There is aortic atherosclerosis, as well as atherosclerosis of the great vessels of the mediastinum and the coronary arteries, including calcified atherosclerotic plaque in the left anterior descending, left circumflex and right coronary arteries. Severe calcifications of the mitral annulus. Mediastinum/Nodes: No pathologically enlarged mediastinal or hilar lymph nodes. Esophagus is unremarkable in appearance. No axillary lymphadenopathy. Lungs/Pleura: Moderate right and small left pleural effusions lying dependently. Some associated passive subsegmental atelectasis in the dependent portion of the right lower lobe. No consolidative airspace disease. Diffuse but patchy areas of ground-glass attenuation and interlobular septal thickening, most compatible with a background of mild interstitial pulmonary edema. A few scattered tiny pulmonary nodules are noted, largest of which measures 5 mm in the right middle lobe (axial image 41 of series 7). Upper Abdomen: Aortic atherosclerosis. Musculoskeletal: There are no aggressive appearing lytic or blastic lesions noted in the visualized portions of the skeleton. Review of the MIP images confirms the above findings. IMPRESSION: 1. No evidence of pulmonary embolism. 2. The appearance of the chest suggests congestive heart failure, including a background of interstitial  pulmonary edema, as well as moderate right and small left pleural effusions. 3. Cardiomegaly with left atrial dilatation. 4. Aortic atherosclerosis, in addition to three-vessel coronary artery disease. Assessment for potential risk factor modification, dietary therapy or pharmacologic therapy may be warranted, if clinically indicated. 5. There are calcifications of the mitral annulus. Echocardiographic correlation for evaluation of potential valvular dysfunction may be warranted if clinically indicated. Aortic Atherosclerosis (ICD10-I70.0). Electronically Signed   By: Vinnie Langton M.D.   On: 02/20/2017 13:58   ECHO 02/21/17 ------------------------------------------------------------------- Study Conclusions  - Left ventricle: The cavity size was normal. Systolic function was   normal. The estimated ejection fraction was in the range of 55%   to 60%. Wall motion was normal; there were no regional wall   motion abnormalities. The study was not technically sufficient to   allow evaluation of LV diastolic dysfunction due  to atrial   fibrillation. - Aortic valve: Trileaflet; moderately thickened, mildly calcified   leaflets. There was trivial regurgitation. - Mitral valve: Calcified annulus. There was mild regurgitation. - Left atrium: The atrium was severely dilated. - Pulmonary arteries: Systolic pressure could not be accurately   estimated.   Discharge Exam: Vitals:   02/23/17 0656 02/23/17 0800  BP: 111/86 129/66  Pulse: 65 88  Resp: 18 20  Temp: 97.9 F (36.6 C) 97.7 F (36.5 C)  SpO2: 96% 98%   Vitals:   02/23/17 0022 02/23/17 0051 02/23/17 0656 02/23/17 0800  BP: (!) 137/103 (!) 150/99 111/86 129/66  Pulse: 75  65 88  Resp: (!) 26  18 20   Temp: 97.6 F (36.4 C)  97.9 F (36.6 C) 97.7 F (36.5 C)  TempSrc: Oral  Oral Oral  SpO2: 92%  96% 98%  Weight:   85.2 kg (187 lb 12.8 oz)   Height:        General: Pt is alert, awake, not in acute distress Cardiovascular: RRR,  S1/S2 +, no rubs, no gallops Respiratory: CTA bilaterally, no wheezing, no rhonchi Abdominal: Soft, NT, ND, bowel sounds + Extremities: no edema Neuro: AAOx3 non focal  Psych: Tangential    The results of significant diagnostics from this hospitalization (including imaging, microbiology, ancillary and laboratory) are listed below for reference.     Microbiology: Recent Results (from the past 240 hour(s))  Urine Culture     Status: Abnormal   Collection Time: 02/20/17  8:58 PM  Result Value Ref Range Status   Specimen Description URINE, RANDOM  Final   Special Requests NONE  Final   Culture MULTIPLE SPECIES PRESENT, SUGGEST RECOLLECTION (A)  Final   Report Status 02/22/2017 FINAL  Final  MRSA PCR Screening     Status: None   Collection Time: 02/21/17  6:35 AM  Result Value Ref Range Status   MRSA by PCR NEGATIVE NEGATIVE Final    Comment:        The GeneXpert MRSA Assay (FDA approved for NASAL specimens only), is one component of a comprehensive MRSA colonization surveillance program. It is not intended to diagnose MRSA infection nor to guide or monitor treatment for MRSA infections.      Labs: BNP (last 3 results) Recent Labs    02/20/17 1509  BNP 381.8*   Basic Metabolic Panel: Recent Labs  Lab 02/20/17 1142 02/20/17 2008 02/21/17 0836  NA 142  --  141  K 3.8  --  3.4*  CL 108  --  107  CO2 24  --  25  GLUCOSE 129*  --  99  BUN 14  --  13  CREATININE 0.75  --  0.82  CALCIUM 9.8  --  9.7  MG  --  2.0  --    Liver Function Tests: No results for input(s): AST, ALT, ALKPHOS, BILITOT, PROT, ALBUMIN in the last 168 hours. No results for input(s): LIPASE, AMYLASE in the last 168 hours. No results for input(s): AMMONIA in the last 168 hours. CBC: Recent Labs  Lab 02/20/17 1142 02/21/17 0836  WBC 6.4 4.9  HGB 14.4 13.4  HCT 45.9 42.7  MCV 89.0 88.2  PLT 213 173   Cardiac Enzymes: Recent Labs  Lab 02/20/17 2008 02/21/17 0836  TROPONINI <0.03  <0.03   BNP: Invalid input(s): POCBNP CBG: No results for input(s): GLUCAP in the last 168 hours. D-Dimer No results for input(s): DDIMER in the last 72 hours. Hgb A1c Recent Labs  02/21/17 0836  HGBA1C 5.2   Lipid Profile Recent Labs    02/21/17 0836  CHOL 127  HDL 37*  LDLCALC 77  TRIG 66  CHOLHDL 3.4   Thyroid function studies Recent Labs    02/21/17 0836  TSH 2.112   Anemia work up Recent Labs    02/21/17 1300  VITAMINB12 325   Urinalysis    Component Value Date/Time   COLORURINE AMBER (A) 02/20/2017 2058   APPEARANCEUR HAZY (A) 02/20/2017 2058   LABSPEC 1.043 (H) 02/20/2017 2058   PHURINE 5.0 02/20/2017 2058   GLUCOSEU NEGATIVE 02/20/2017 2058   HGBUR NEGATIVE 02/20/2017 2058   [place] NEGATIVE 02/20/2017 2058   [place] NEGATIVE 02/20/2017 2058   PROTEINUR 100 (A) 02/20/2017 2058   UROBILINOGEN 0.2 10/10/2014 1812   NITRITE NEGATIVE 02/20/2017 2058   LEUKOCYTESUR NEGATIVE 02/20/2017 2058   Sepsis Labs Invalid input(s): PROCALCITONIN,  WBC,  LACTICIDVEN Microbiology Recent Results (from the past 240 hour(s))  Urine Culture     Status: Abnormal   Collection Time: 02/20/17  8:58 PM  Result Value Ref Range Status   Specimen Description URINE, RANDOM  Final   Special Requests NONE  Final   Culture MULTIPLE SPECIES PRESENT, SUGGEST RECOLLECTION (A)  Final   Report Status 02/22/2017 FINAL  Final  MRSA PCR Screening     Status: None   Collection Time: 02/21/17  6:35 AM  Result Value Ref Range Status   MRSA by PCR NEGATIVE NEGATIVE Final    Comment:        The GeneXpert MRSA Assay (FDA approved for NASAL specimens only), is one component of a comprehensive MRSA colonization surveillance program. It is not intended to diagnose MRSA infection nor to guide or monitor treatment for MRSA infections.     Time coordinating discharge:  32 minutes  SIGNED:  Chipper Oman, MD  [facility] 02/23/2017, 10:21 AM  Pager please text  page via  [email or web address]

## 2017-02-23 NOTE — Evaluation (Signed)
Physical Therapy Evaluation Patient Details Name: Beth Haynes MRN: [id number] DOB: [birth year] Today's Date: 02/23/2017   History of Present Illness  72 y.o. female with medical history significant of dCHF, hypertension, depression, OSA on CPAP, atrial fibrillation on Eliquis, but not compliant), IBS, urinary incontinence, panic attack, who presents with shortness of breath.  Clinical Impression  Orders received for PT evaluation. Patient demonstrates deficits in functional mobility as indicated below. Will benefit from continued skilled PT to address deficits and maximize function. Will see as indicated and progress as tolerated.   OF NOTE: Patient extremely emotional throughout session, in tears and perseverating on her husband leaving her and trying to take her money. She also is concerned regarding being told she has dementia.  May benefit from chaplain services as patient appeared to really want to spend time talking about her current stressors.    Follow Up Recommendations Home health PT;Supervision/Assistance - 24 hour    Equipment Recommendations  None recommended by PT    Recommendations for Other Services       Precautions / Restrictions Precautions Precautions: Fall Restrictions Weight Bearing Restrictions: No      Mobility  Bed Mobility Overal bed mobility: Modified Independent                Transfers Overall transfer level: Needs assistance Equipment used: Rolling walker (2 wheeled) Transfers: Sit to/from Stand Sit to Stand: Min assist         General transfer comment: Min assist for stability, cues for safety  Ambulation/Gait Ambulation/Gait assistance: Min guard Ambulation Distance (Feet): 180 Feet Assistive device: None Gait Pattern/deviations: Step-through pattern;Drifts right/left Gait velocity: decreased   General Gait Details: modest instability noted, VCs for increased cadence. Patient denies pain with mobility reports some SOB due to  emotional status (patient crying)  Stairs            Wheelchair Mobility    Modified Rankin (Stroke Patients Only)       Balance Overall balance assessment: Needs assistance Sitting-balance support: Feet supported Sitting balance-Leahy Scale: Good     Standing balance support: During functional activity Standing balance-Leahy Scale: Fair                               Pertinent Vitals/Pain Pain Assessment: No/denies pain    Home Living Family/patient expects to be discharged to:: Private residence Living Arrangements: Spouse/significant other Available Help at Discharge: Family Type of Home: House Home Access: Stairs to enter Entrance Stairs-Rails: Can reach both Entrance Stairs-Number of Steps: 10 Home Layout: Two level;Able to live on main level with bedroom/bathroom Home Equipment: Kasandra Knudsen - single point;Walker - 2 wheels      Prior Function Level of Independence: Independent               Hand Dominance   Dominant Hand: Right    Extremity/Trunk Assessment   Upper Extremity Assessment Upper Extremity Assessment: Overall WFL for tasks assessed    Lower Extremity Assessment Lower Extremity Assessment: Overall WFL for tasks assessed       Communication   Communication: No difficulties  Cognition Arousal/Alertness: Awake/alert Behavior During Therapy: Anxious Overall Cognitive Status: Impaired/Different from baseline Area of Impairment: Safety/judgement;Awareness;Problem solving                         Safety/Judgement: Decreased awareness of deficits Awareness: Emergent Problem Solving: Slow processing;Requires verbal cues;Requires tactile cues General Comments:  Patient extremely labile during session, perseverating on the idea that her husband is lying to her and trying to get rid of her      General Comments      Exercises     Assessment/Plan    PT Assessment Patient needs continued PT services  PT Problem  List Decreased balance;Decreased mobility;Decreased activity tolerance       PT Treatment Interventions DME instruction;Gait training;Stair training;Functional mobility training;Therapeutic activities;Therapeutic exercise;Balance training;Patient/family education    PT Goals (Current goals can be found in the Care Plan section)  Acute Rehab PT Goals Patient Stated Goal: to go home PT Goal Formulation: With patient Potential to Achieve Goals: Good    Frequency Min 3X/week   Barriers to discharge        Co-evaluation               AM-PAC PT "6 Clicks" Daily Activity  Outcome Measure Difficulty turning over in bed (including adjusting bedclothes, sheets and blankets)?: A Little Difficulty moving from lying on back to sitting on the side of the bed? : A Little Difficulty sitting down on and standing up from a chair with arms (e.g., wheelchair, bedside commode, etc,.)?: A Little Help needed moving to and from a bed to chair (including a wheelchair)?: A Little Help needed walking in hospital room?: A Little Help needed climbing 3-5 steps with a railing? : A Lot 6 Click Score: 17    End of Session Equipment Utilized During Treatment: Gait belt Activity Tolerance: Patient tolerated treatment well;Other (comment)(patient extremely emotional throughout session) Patient left: in bed;with call bell/phone within reach;with nursing/sitter in room Nurse Communication: Mobility status PT Visit Diagnosis: Unsteadiness on feet (R26.81)    Time: 1224-8250 PT Time Calculation (min) (ACUTE ONLY): 21 min   Charges:   PT Evaluation $PT Eval Moderate Complexity: 1 Mod     PT G Codes:        Alben Deeds, PT DPT  Board Certified Neurologic Specialist [phone]    Duncan Dull 02/23/2017, 10:32 AM

## 2017-02-23 NOTE — Care Management Note (Signed)
Case Management Note Previous CM note completed by Zenon Mayo, RN--02/21/2017, 5:25 PM   Patient Details  Name: Beth Haynes MRN: [id number] Date of Birth: December 08, 1945  Subjective/Objective:  From home with spouse, pta indep, Presents with afib, confused, has sitter at bedside for safety was on amio drip, changed to po today, cont to monitor. She will benefit from pt eval when appropriate. She has not had [facility] services before . Will await pt eval.                Action/Plan: NCM will follow for dc needs.   Expected Discharge Date:  02/23/17               Expected Discharge Plan:  [place]  In-House Referral:  NA  Discharge planning Services  CM Consult  Post Acute Care Choice:  Home Health Choice offered to:  Patient, Spouse  DME Arranged:  N/A DME Agency:  NA  HH Arranged:  PT, Nurse's Aide, Social Work [employer] Agency:  [place]  Status of Service:  Completed, signed off  If discussed at [employer] of Stay Meetings, dates discussed:    Discharge Disposition: home/home health   Additional Comments:  02/23/17- 1100- [name] RN, CM- pt for d/c home today- orders placed for HHPT/aide/sw- spoke with pt and spouse at bedside- choice offered for [facility] agency in [place]- pt selected [place] for [facility] services- referral called to [facility] with Alvis Lemmings- referral accepted-   [name], Romeo Rabon, RN 02/23/2017, 11:17 AM [phone]-- 3E weekend coverage

## 2017-02-23 NOTE — Progress Notes (Signed)
Instructions given to pt and husband at the bedside. IV and tele taken off. Taken out via wheelchair.

## 2017-02-24 ENCOUNTER — Telehealth: Payer: Self-pay | Admitting: Cardiovascular Disease

## 2017-02-24 NOTE — Telephone Encounter (Signed)
Follow Up Call.:  Mr.Hanton is calling to speak to you about his wife medications . Please call

## 2017-02-24 NOTE — Telephone Encounter (Signed)
Spoke with patient and her husband regarding questions about medications since patient was discharged from the hospital 1/10. I answered their questions to their satisfactio and verified they they understand the medications patient was prescribed at discharge. I scheduled her for post-hospital visit with Dr. Acie Fredrickson on Wed. 1/16. They thanked me for my help.

## 2017-02-24 NOTE — Telephone Encounter (Signed)
New message   Patient husband said that the pt was in the hospital and they made some changes to her medications  And he just has some questions and concerns. Please call

## 2017-02-25 ENCOUNTER — Encounter: Payer: Self-pay | Admitting: Cardiovascular Disease

## 2017-02-26 ENCOUNTER — Encounter: Payer: Self-pay | Admitting: Cardiovascular Disease

## 2017-02-26 ENCOUNTER — Ambulatory Visit: Payer: Medicare Other | Admitting: Cardiovascular Disease

## 2017-02-26 VITALS — BP 118/90 | HR 92 | Wt 187.8 lb

## 2017-02-26 DIAGNOSIS — I4891 Unspecified atrial fibrillation: Secondary | ICD-10-CM | POA: Diagnosis not present

## 2017-02-26 NOTE — Progress Notes (Signed)
Cardiology Office Note   Date:  02/26/2017   ID:  Beth Haynes, DOB 07/29/1945, MRN [id number]  PCP:  Velna Hatchet, MD  Cardiologist:   Mertie Moores, MD   Chief Complaint  Patient presents with  . Atrial Fibrillation  . Hypertension   Problem List 1. Possible paroxysmal atrial fib 2. Obstructive sleep apnea:   Having difficulty in getting a CPAP mask to fit.    History of Present Illness: Beth Haynes is a 72 y.o. female who presents for evaluation of possible atrial fib. She woke up with dizziness, room was spinning  Was seen by PA. Presented to the ER .  Head MRI was normal.  Symptoms sound like vertigo   She still has some occasional episodes of dizziness.   Has occasional episodes of palpitations ,  She thinks it may be related to panic attacks. No CP or dyspnea. Does not get any regular exercise.   Has a cabin in [place]   Dec. 6, 2016:  Beth Haynes is seen today for follow up of her atrial fib.  BP is elevated today .   Ate some salty foods this weekend .   July 20, 2015:  Beth Haynes is seen back today .  Has PAF and OSA. She does not have a good CPAP mask that fits.   Dec. 13, 2017:  Was not able to find a CPAP that worked .   Could never get used to it  BP is elevated.  Has been eating more salt than usual - she and her husband eat popcorn every night.   February 26, 2017: Was hospitalized with CHF,  And Afib .  last week. Was not taking her meds and developed CHF  Is on Eliquis but is changing to Xarelto 20 mg a day    Past Medical History:  Diagnosis Date  . Atrial fibrillation ([place])   . IBS (irritable bowel syndrome)   . Obesity (BMI 30-39.9) 07/11/2015  . OSA (obstructive sleep apnea) 01/17/2015   Severe with AHI 35.8/hr  . Panic attacks   . Urinary incontinence, nocturnal enuresis     Past Surgical History:  Procedure Laterality Date  . TONSILLECTOMY       Current Outpatient Medications  Medication Sig Dispense Refill    . diltiazem (CARDIZEM CD) 360 MG 24 hr capsule Take 1 capsule (360 mg total) by mouth daily. 30 capsule 0  . donepezil (ARICEPT) 5 MG tablet Take 1 tablet (5 mg total) by mouth at bedtime. 30 tablet 0  . losartan (COZAAR) 50 MG tablet Take 50 mg by mouth daily. Reported on 07/20/2015  5  . rivaroxaban (XARELTO) 20 MG TABS tablet Take 1 tablet (20 mg total) by mouth daily with supper. 30 tablet 0  . sertraline (ZOLOFT) 100 MG tablet Take 100 mg by mouth daily.     No current facility-administered medications for this visit.     Allergies:   Epinephrine and Sulfa antibiotics    Social History:  The patient  reports that  has never smoked. she has never used smokeless tobacco. She reports that she does not drink alcohol or use drugs.   Family History:  The patient's family history includes Breast cancer in her maternal aunt; Heart attack in her father and paternal grandfather; Heart failure in her mother; Hypertension in her maternal grandmother.    ROS: Noted in current history, otherwise negative.   Physical Exam: Blood pressure 118/90, pulse 92, weight  187 lb 12.8 oz (85.2 kg), SpO2 96 %.  GEN:  Well nourished, well developed in no acute distress HEENT: Normal NECK: No JVD; No carotid bruits LYMPHATICS: No lymphadenopathy CARDIAC: irreg. Irreg.   Tachycardia  RESPIRATORY:  Clear to auscultation without rales, wheezing or rhonchi  ABDOMEN: Soft, non-tender, non-distended MUSCULOSKELETAL:  No edema; No deformity  SKIN: Warm and dry NEUROLOGIC:  Alert and oriented x 3    EKG:   Recent Labs: 02/20/2017: B Natriuretic Peptide 387.9; Magnesium 2.0 02/21/2017: BUN 13; Creatinine, Ser 0.82; Hemoglobin 13.4; Platelets 173; Potassium 3.4; Sodium 141; TSH 2.112    Lipid Panel    Component Value Date/Time   CHOL 127 02/21/2017 0836   TRIG 66 02/21/2017 0836   HDL 37 (L) 02/21/2017 0836   CHOLHDL 3.4 02/21/2017 0836   VLDL 13 02/21/2017 0836   LDLCALC 77 02/21/2017 0836       Wt Readings from Last 3 Encounters:  02/26/17 187 lb 12.8 oz (85.2 kg)  02/23/17 187 lb 12.8 oz (85.2 kg)  10/30/16 186 lb (84.4 kg)      Other studies Reviewed: Additional studies/ records that were reviewed today include: . Review of the above records demonstrates:    ASSESSMENT AND PLAN:  1.  Vertigo:     2.  paroxysmal atrial fib:  CHADS2VASC is 2 ( age, female )  Was just started on Cardizem 360 mg today .  Has just taken 1 tablet Still has a very fast heart rate.  I will see her in 1 week for continued titration of medications.  We will likely have to add low-dose beta-blocker.    3. Obstructive sleep apnea:      4. Essential HTN:    - BP   Labs from her primary medical doctor today reveal potassium of 3.9.  Creatinine is 0.7.  Sodium is 142.   Current medicines are reviewed at length with the patient today.  The patient does not have concerns regarding medicines.  The following changes have been made:  no change  Labs/ tests ordered today include:  No orders of the defined types were placed in this encounter.    Disposition:   FU with me in 6 months .      Mertie Moores, MD  02/26/2017 3:08 PM    [facility] [place], [place], [place]  [address] Phone: [phone]; Fax: [phone]   [facility]  [address] [place], [place]  [address] [phone]   Fax [phone]

## 2017-02-26 NOTE — Patient Instructions (Addendum)
Medication Instructions:  Your physician recommends that you continue on your current medications as directed. Please refer to the Current Medication list given to you today.   Labwork: None Ordered   Testing/Procedures: None Ordered   Follow-Up: Your physician recommends that you return for a follow-up appointment on Wednesday January 23 at 10:40 am with Dr. Acie Fredrickson   If you need a refill on your cardiac medications before your next appointment, please call your pharmacy.   Thank you for choosing [facility]! Christen Bame, RN [phone]

## 2017-03-05 ENCOUNTER — Ambulatory Visit: Payer: Medicare Other | Admitting: Cardiovascular Disease

## 2017-03-05 ENCOUNTER — Encounter: Payer: Self-pay | Admitting: Cardiovascular Disease

## 2017-03-05 VITALS — BP 132/74 | HR 95 | Ht 62.0 in | Wt 181.8 lb

## 2017-03-05 DIAGNOSIS — I481 Persistent atrial fibrillation: Secondary | ICD-10-CM | POA: Diagnosis not present

## 2017-03-05 DIAGNOSIS — I4819 Other persistent atrial fibrillation: Secondary | ICD-10-CM

## 2017-03-05 MED ORDER — METOPROLOL SUCCINATE ER 25 MG PO TB24: 25.0000 mg | ORAL_TABLET | Freq: Every day | ORAL | 11 refills | Status: DC

## 2017-03-05 NOTE — Patient Instructions (Addendum)
Medication Instructions:  Your physician has recommended you make the following change in your medication:  START Metoprolol (Toprol XL) 25 mg once daily START Xarelto 20  Mg tonight, then tomorrow take once daily at lunch   Labwork: None Ordered   Testing/Procedures: None Ordered   Follow-Up: Your physician recommends that you schedule a follow-up appointment in: 3 weeks with APP to discuss and schedule cardioversion   If you need a refill on your cardiac medications before your next appointment, please call your pharmacy.   Thank you for choosing [facility]! Christen Bame, RN [phone]

## 2017-03-05 NOTE — Progress Notes (Signed)
Cardiology Office Note   Date:  03/05/2017   ID:  [name], [name] [birth year], MRN [id number]  PCP:  Velna Hatchet, MD  Cardiologist:   Mertie Moores, MD   Chief Complaint  Patient presents with  . Atrial Fibrillation   Problem List 1. Possible paroxysmal atrial fib 2. Obstructive sleep apnea:   Having difficulty in getting a CPAP mask to fit.    History of Present Illness: Beth Haynes is a 72 y.o. female who presents for evaluation of possible atrial fib. She woke up with dizziness, room was spinning  Was seen by PA. Presented to the ER .  Head MRI was normal.  Symptoms sound like vertigo   She still has some occasional episodes of dizziness.   Has occasional episodes of palpitations ,  She thinks it may be related to panic attacks. No CP or dyspnea. Does not get any regular exercise.   Has a cabin in [place] near [place]   Dec. 6, 2016:  Beth Haynes is seen today for follow up of her atrial fib.  BP is elevated today .   Ate some salty foods this weekend .   July 20, 2015:  Beth Haynes is seen back today .  Has PAF and OSA. She does not have a good CPAP mask that fits.   Dec. 13, 2017:  Was not able to find a CPAP that worked .   Could never get used to it  BP is elevated.  Has been eating more salt than usual - she and her husband eat popcorn every night.   February 26, 2017: Was hospitalized with CHF,  And Afib .  last week. Was not taking her meds and developed CHF  Is on Eliquis but is changing to Xarelto 20 mg a day   Mar 05, 2017:  Beth Haynes is doing better.  Still in AF,  HR is better controlled.  Was not taking the eliquis as directed initialy but has   Past Medical History:  Diagnosis Date  . Atrial fibrillation ([place])   . IBS (irritable bowel syndrome)   . Obesity (BMI 30-39.9) 07/11/2015  . OSA (obstructive sleep apnea) 01/17/2015   Severe with AHI 35.8/hr  . Panic attacks   . Urinary incontinence, nocturnal enuresis     Past Surgical  History:  Procedure Laterality Date  . TONSILLECTOMY       Current Outpatient Medications  Medication Sig Dispense Refill  . diltiazem (CARDIZEM CD) 360 MG 24 hr capsule Take 1 capsule (360 mg total) by mouth daily. 30 capsule 0  . donepezil (ARICEPT) 5 MG tablet Take 1 tablet (5 mg total) by mouth at bedtime. 30 tablet 0  . losartan (COZAAR) 50 MG tablet Take 50 mg by mouth daily. Reported on 07/20/2015  5  . rivaroxaban (XARELTO) 20 MG TABS tablet Take 1 tablet (20 mg total) by mouth daily with supper. 30 tablet 0  . sertraline (ZOLOFT) 100 MG tablet Take 100 mg by mouth daily.     No current facility-administered medications for this visit.     Allergies:   Epinephrine and Sulfa antibiotics    Social History:  The patient  reports that  has never smoked. she has never used smokeless tobacco. She reports that she does not drink alcohol or use drugs.   Family History:  The patient's family history includes Breast cancer in her maternal aunt; Heart attack in her father and paternal grandfather; Heart failure in her mother;  Hypertension in her maternal grandmother.    ROS: Noted in current history, otherwise negative.  Physical Exam: Blood pressure 132/74, pulse 95, height 5\' 2"  (1.575 m), weight 181 lb 12.8 oz (82.5 kg), SpO2 96 %.  GEN:  Well nourished, well developed in no acute distress HEENT: Normal NECK: No JVD; No carotid bruits LYMPHATICS: No lymphadenopathy CARDIAC:   Irreg. Irreg. , rubs, gallops RESPIRATORY:  Clear to auscultation without rales, wheezing or rhonchi  ABDOMEN: Soft, non-tender, non-distended MUSCULOSKELETAL:  No edema; No deformity  SKIN: Warm and dry NEUROLOGIC:  Alert and oriented x 3   EKG:  March 05, 2017:   Atrial fibrillation at a rate of 95 beats a minute.  Nonspecific ST of normality.  Recent Labs: 02/20/2017: B Natriuretic Peptide 387.9; Magnesium 2.0 02/21/2017: BUN 13; Creatinine, Ser 0.82; Hemoglobin 13.4; Platelets 173; Potassium 3.4;  Sodium 141; TSH 2.112    Lipid Panel    Component Value Date/Time   CHOL 127 02/21/2017 0836   TRIG 66 02/21/2017 0836   HDL 37 (L) 02/21/2017 0836   CHOLHDL 3.4 02/21/2017 0836   VLDL 13 02/21/2017 0836   LDLCALC 77 02/21/2017 0836      Wt Readings from Last 3 Encounters:  03/05/17 181 lb 12.8 oz (82.5 kg)  02/26/17 187 lb 12.8 oz (85.2 kg)  02/23/17 187 lb 12.8 oz (85.2 kg)      Other studies Reviewed: Additional studies/ records that were reviewed today include: . Review of the above records demonstrates:    ASSESSMENT AND PLAN:  1.  Vertigo:     2.  paroxysmal atrial fib:  CHADS2VASC is 2 ( age, female )  HR is better after the addition of diltiazem.  Her rate is still a little bit on the high side.  We will add Toprol-XL 25 mg a day. She is still on the Eliquis because she still has some tablets remaining.  The plan is to switch to Xarelto. We will have her go ahead and switched to Xarelto take her first dose of Xarelto 20 mg tonight with dinner.  She thinks that she will be taking it at lunchtime because that is her largest meal of the day.  She will resume the lunchtime dosing tomorrow.  We will have her see a 71 assistant or nurse practitioner in 2-3 weeks and set up a cardioversion at that time.    3. Obstructive sleep apnea:      4. Essential HTN:        Current medicines are reviewed at length with the patient today.  The patient does not have concerns regarding medicines.  The following changes have been made:  no change  Labs/ tests ordered today include:  No orders of the defined types were placed in this encounter.    Disposition:   FU with me in 3 months .      Mertie Moores, MD  03/05/2017 11:16 AM    [place], [place], [place]  [address] Phone: [phone]; Fax: [phone]   [facility]  [address] [place], [place]  [address] [phone]   Fax [phone]

## 2017-03-06 ENCOUNTER — Telehealth: Payer: Self-pay | Admitting: Cardiovascular Disease

## 2017-03-06 NOTE — Telephone Encounter (Signed)
Left message for patient to call back  

## 2017-03-06 NOTE — Telephone Encounter (Signed)
Patient calling, this call is in reference to the "new medication that she started last night." Patient states that she feels the "best she's felt in months." Patient would like to know if that is normal?

## 2017-03-07 NOTE — Telephone Encounter (Signed)
Spoke with patient who states she feels a great deal better since starting Metoprolol. She asks if she should be concerned about feeling this much better. I advised that her improvement may be related to the slowing of her HR. She verbalized understanding and thanked me for the information.

## 2017-03-21 ENCOUNTER — Telehealth: Payer: Self-pay | Admitting: Cardiovascular Disease

## 2017-03-21 MED ORDER — DILTIAZEM HCL ER COATED BEADS 360 MG PO CP24: 360.0000 mg | ORAL_CAPSULE | Freq: Every day | ORAL | 3 refills | Status: DC

## 2017-03-21 NOTE — Telephone Encounter (Signed)
Pt's medication was sent to pt's pharmacy as requested. Confirmation received.  °

## 2017-03-21 NOTE — Telephone Encounter (Signed)
Pt calling requesting a refill on diltiazem 360 mg tablet. Dr. Acie Fredrickson did not prescribe this medication. Would you like to refill this medication?" please address.

## 2017-03-21 NOTE — Telephone Encounter (Signed)
I will assume responsibility for this med Ok to refill

## 2017-03-21 NOTE — Telephone Encounter (Signed)
Follow Up    *STAT* If patient is at the pharmacy, call can be transferred to refill team.   1. Which medications need to be refilled? (please list name of each medication and dose if known) diltiazem (CARDIZEM CD) 360 MG 24 hr capsule  2. Which pharmacy/location (including street and city if local pharmacy) is medication to be sent to?  [facility] [place], [address] AT [facility] OF [address] [phone] (Phone) [phone] (Fax)   3. Do they need a 30 day or 90 day supply? 30  Patient calling to check on the status of her refill Please call

## 2017-03-26 ENCOUNTER — Ambulatory Visit (INDEPENDENT_AMBULATORY_CARE_PROVIDER_SITE_OTHER): Payer: Medicare Other | Admitting: Physician Assistant

## 2017-03-26 ENCOUNTER — Encounter: Payer: Self-pay | Admitting: *Deleted

## 2017-03-26 ENCOUNTER — Encounter: Payer: Self-pay | Admitting: Physician Assistant

## 2017-03-26 VITALS — BP 128/80 | HR 89 | Ht 62.0 in | Wt 178.8 lb

## 2017-03-26 DIAGNOSIS — I5032 Chronic diastolic (congestive) heart failure: Secondary | ICD-10-CM

## 2017-03-26 DIAGNOSIS — I10 Essential (primary) hypertension: Secondary | ICD-10-CM | POA: Diagnosis not present

## 2017-03-26 DIAGNOSIS — I481 Persistent atrial fibrillation: Secondary | ICD-10-CM

## 2017-03-26 DIAGNOSIS — I4819 Other persistent atrial fibrillation: Secondary | ICD-10-CM

## 2017-03-26 DIAGNOSIS — F0280 Dementia in other diseases classified elsewhere without behavioral disturbance: Secondary | ICD-10-CM | POA: Diagnosis not present

## 2017-03-26 NOTE — H&P (View-Only) (Signed)
Cardiology Office Note:    Date:  03/26/2017   ID:  Beth Haynes, DOB [birth year], MRN [id number]  PCP:  Velna Hatchet, MD  Cardiologist:  Mertie Moores, MD  Sleep:  Dr. Brett Fairy  Referring MD: Velna Hatchet, MD   Chief Complaint  Patient presents with  . Atrial Fibrillation    follow up    History of Present Illness:    Beth Haynes is a 72 y.o. female with a hx of Paroxysmal atrial fibrillation, HTN, OSA.  CHADS2-VASc=3 (female, 73 yo, HTN). She is on Apixaban for anticoagulation.  She was admitted in 02/2017 for decompensated diastolic congestive heart failure in the setting of atrial fibrillation with rapid rate and UTI.  She had been nonadherent to Eliquis and was to be switched to Xarelto.  She has been seen in follow-up by Dr. Acie Fredrickson twice since discharge.  Last seen 03/05/17.  Metoprolol succinate was added for better rate control.  She had continued to take Eliquis.  The plan at her last office visit was to switch to Xarelto that day and return today for reevaluation with an eye towards cardioversion.  Beth Haynes returns for follow up on atrial fibrillation.  She is here with her husband. She is doing well.  She has not had any chest pain, shortness of breath, syncope, leg swelling or paroxysmal nocturnal dyspnea. She has not had any bleeding issues.    Prior CV studies:   The following studies were reviewed today:  Chest CTA 02/20/17 IMPRESSION: 1. No evidence of pulmonary embolism. 2. The appearance of the chest suggests congestive heart failure, including a background of interstitial pulmonary edema, as well as moderate right and small left pleural effusions. 3. Cardiomegaly with left atrial dilatation. 4. Aortic atherosclerosis, in addition to three-vessel coronary artery disease. Assessment for potential risk factor modification, dietary therapy or pharmacologic therapy may be warranted, if clinically indicated. 5. There are calcifications of the mitral annulus.  Echocardiographic correlation for evaluation of potential valvular dysfunction may be warranted if clinically indicated.   Echo 02/21/17 EF 55-60, no RWMA, trivial AI, MAC, mild MR, severe LAE  Echo 12/07/14 Moderate concentric LVH, EF 60-65, normal wall motion, grade 2 diastolic dysfunction, MAC, mild RAE, PASP 36  Event monitor 9/16 NSR, paroxysmal atrial fibrillation   Past Medical History:  Diagnosis Date  . Atrial fibrillation ([place])   . IBS (irritable bowel syndrome)   . Obesity (BMI 30-39.9) 07/11/2015  . OSA (obstructive sleep apnea) 01/17/2015   Severe with AHI 35.8/hr  . Panic attacks   . Urinary incontinence, nocturnal enuresis     Past Surgical History:  Procedure Laterality Date  . TONSILLECTOMY      Current Medications: Current Meds  Medication Sig  . diltiazem (CARDIZEM CD) 360 MG 24 hr capsule Take 1 capsule (360 mg total) by mouth daily.  Marland Kitchen losartan (COZAAR) 50 MG tablet Take 50 mg by mouth daily. Reported on 07/20/2015  . metoprolol succinate (TOPROL XL) 25 MG 24 hr tablet Take 1 tablet (25 mg total) by mouth daily.  . rivaroxaban (XARELTO) 20 MG TABS tablet Take 1 tablet (20 mg total) by mouth daily with supper.  . sertraline (ZOLOFT) 100 MG tablet Take 100 mg by mouth daily.     Allergies:   Epinephrine and Sulfa antibiotics   Social History   Tobacco Use  . Smoking status: Never Smoker  . Smokeless tobacco: Never Used  Substance Use Topics  . Alcohol use: No  . Drug use: No  Family Hx: The patient's family history includes Breast cancer in her maternal aunt; Heart attack in her father and paternal grandfather; Heart failure in her mother; Hypertension in her maternal grandmother.  ROS:   Please see the history of present illness.    Review of Systems  Gastrointestinal: Positive for constipation and diarrhea.   All other systems reviewed and are negative.   EKGs/Labs/Other Test Reviewed:    EKG:  EKG is  ordered today.  The ekg ordered  today demonstrates atrial fibrillation, HR 83, PVC  Recent Labs: 02/20/2017: B Natriuretic Peptide 387.9; Magnesium 2.0 02/21/2017: BUN 13; Creatinine, Ser 0.82; Hemoglobin 13.4; Platelets 173; Potassium 3.4; Sodium 141; TSH 2.112   Recent Lipid Panel Lab Results  Component Value Date/Time   CHOL 127 02/21/2017 08:36 AM   TRIG 66 02/21/2017 08:36 AM   HDL 37 (L) 02/21/2017 08:36 AM   CHOLHDL 3.4 02/21/2017 08:36 AM   LDLCALC 77 02/21/2017 08:36 AM     Physical Exam:    VS:  BP 128/80   Pulse 89   Ht _0  (1.575 m)   Wt 178 lb 12.8 oz (81.1 kg)   SpO2 96%   BMI 32.70 kg/m     Wt Readings from Last 3 Encounters:  03/26/17 178 lb 12.8 oz (81.1 kg)  03/05/17 181 lb 12.8 oz (82.5 kg)  02/26/17 187 lb 12.8 oz (85.2 kg)     Physical Exam  Constitutional: She is oriented to person, place, and time. She appears well-developed and well-nourished. No distress.  HENT:  Head: Normocephalic and atraumatic.  Eyes: No scleral icterus.  Neck: No JVD present.  Cardiovascular: Normal rate. An irregularly irregular rhythm present.  No murmur heard. Pulmonary/Chest: Effort normal. She has no rales.  Abdominal: Soft.  Musculoskeletal: She exhibits no edema.  Neurological: She is alert and oriented to person, place, and time.  Skin: Skin is warm and dry.    ASSESSMENT & PLAN:    1.  Persistent atrial fibrillation ([place])  She remains in atrial fibrillation today.  Her HR is controlled.  She notes adherence to Xarelto.  She has not missed a dose.  We discussed the risks and benefits of proceeding with cardioversion.  We also discussed the importance of maintaining uninterrupted anticoagulation for 3-4 weeks prior to cardioversion.  -Arrange DCCV next week  -BMET today  -Follow up 2 weeks after DCCV  2.  Chronic diastolic CHF (congestive heart failure) ([place]) She experienced decompensated heart failure in the setting of atrial fibrillation with rapid ventricular rate.  She is well  compensated now with NYHA 2 symptoms.  She is not currently on diuretic therapy.  3.  Essential hypertension The patient's blood pressure is controlled on her current regimen.  Continue current therapy.   4.  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  This was started by the hospitalist while she was admitted.  The notes do indicate she had a urinary tract infection at the time.  Question if her mental status changes were related to her infection.  She does not feel as though she needs to continue on this medication.  I have asked her to discuss this with either her primary care physician or her neurologist.   Dispo:  Return in about 2 weeks (around 04/09/2017) for Post Procedure Follow Up, w/ Dr. Acie Fredrickson, or Richardson Dopp, PA-C.   Medication Adjustments/Labs and Tests Ordered: Current medicines are reviewed at length with the patient  today.  Concerns regarding medicines are outlined above.  Tests Ordered: Orders Placed This Encounter  Procedures  . Basic metabolic panel  . EKG 12-Lead  . EKG 12-Lead   Medication Changes: No orders of the defined types were placed in this encounter.   Signed, Richardson Dopp, PA-C  03/26/2017 12:50 PM    [facility] [place], [place], [place]  [address] Phone: [phone]; Fax: [phone]    Attending Note:   The patient was seen and examined.  Agree with assessment and plan as noted above.  Changes made to the above note as needed.  Patient seen and independently examined with Richardson Dopp, PA .   We discussed all aspects of the encounter. I agree with the assessment and plan as stated above.  1. Atrial fib:   Pt has been on [facility]  Will schedule her for outpatient cardioversion  She has been reminded of the importance of taking her [place]    I have spent a total of 40 minutes with patient reviewing hospital  notes , telemetry, EKGs, labs and examining  patient as well as establishing an assessment and plan that was discussed with the patient. > 50% of time was spent in direct patient care.    Thayer Headings, Brooke Bonito., MD, [facility] 03/26/2017, 4:20 PM [address]  [place] Pager [phone]

## 2017-03-26 NOTE — Patient Instructions (Signed)
Medication Instructions:  Your physician recommends that you continue on your current medications as directed. Please refer to the Current Medication list given to you today.   Labwork: TODAY:  BMET  Testing/Procedures: Your physician has recommended that you have a Cardioversion (DCCV). Electrical Cardioversion uses a jolt of electricity to your heart either through paddles or wired patches attached to your chest. This is a controlled, usually prescheduled, procedure. Defibrillation is done under light anesthesia in the hospital, and you usually go home the day of the procedure. This is done to get your heart back into a normal rhythm. You are not awake for the procedure. Please see the instruction sheet given to you today.    Follow-Up: Your physician recommends that you schedule a follow-up appointment in: 2 WEEKS AFTER 2/20/19WITH DR. Acie Fredrickson OR SCOTT WEAVER, PA-C   Any Other Special Instructions Will Be Listed Below (If Applicable).  [place] [facility]  Electrical Cardioversion, Care After This sheet gives you information about how to care for yourself after your procedure. Your health care provider may also give you more specific instructions. If you have problems or questions, contact your health care provider. What can I expect after the procedure? After the procedure, it is common to have:  Some redness on the skin where the shocks were given.  Follow these instructions at home:  Do not drive for 24 hours if you were given a medicine to help you relax (sedative).  Take over-the-counter and prescription medicines only as told by your health care provider.  Ask your health care provider how to check your pulse. Check it often.  Rest for 48 hours after the procedure or as told by your health care provider.  Avoid or limit your caffeine use as told by your health care provider. Contact a health care provider if:  You feel like your heart is beating too quickly or your pulse is not  regular.  You have a serious muscle cramp that does not go away. Get help right away if:  You have discomfort in your chest.  You are dizzy or you feel faint.  You have trouble breathing or you are short of breath.  Your speech is slurred.  You have trouble moving an arm or leg on one side of your body.  Your fingers or toes turn cold or blue. This information is not intended to replace advice given to you by your health care provider. Make sure you discuss any questions you have with your health care provider. Document Released: 11/18/2012 Document Revised: 09/01/2015 Document Reviewed: 08/04/2015 Elsevier Interactive Patient Education  [employer].   If you need a refill on your cardiac medications before your next appointment, please call your pharmacy.

## 2017-03-26 NOTE — Progress Notes (Signed)
Cardiology Office Note:    Date:  03/26/2017   ID:  Beth Haynes, DOB [birth year], MRN [id number]  PCP:  Velna Hatchet, MD  Cardiologist:  Mertie Moores, MD  Sleep:  Dr. Brett Fairy  Referring MD: Velna Hatchet, MD   Chief Complaint  Patient presents with  . Atrial Fibrillation    follow up    History of Present Illness:    Beth Haynes is a 72 y.o. female with a hx of Paroxysmal atrial fibrillation, HTN, OSA.  CHADS2-VASc=3 (female, 73 yo, HTN). She is on Apixaban for anticoagulation.  She was admitted in 02/2017 for decompensated diastolic congestive heart failure in the setting of atrial fibrillation with rapid rate and UTI.  She had been nonadherent to Eliquis and was to be switched to Xarelto.  She has been seen in follow-up by Dr. Acie Fredrickson twice since discharge.  Last seen 03/05/17.  Metoprolol succinate was added for better rate control.  She had continued to take Eliquis.  The plan at her last office visit was to switch to Xarelto that day and return today for reevaluation with an eye towards cardioversion.  Beth Haynes returns for follow up on atrial fibrillation.  She is here with her husband. She is doing well.  She has not had any chest pain, shortness of breath, syncope, leg swelling or paroxysmal nocturnal dyspnea. She has not had any bleeding issues.    Prior CV studies:   The following studies were reviewed today:  Chest CTA 02/20/17 IMPRESSION: 1. No evidence of pulmonary embolism. 2. The appearance of the chest suggests congestive heart failure, including a background of interstitial pulmonary edema, as well as moderate right and small left pleural effusions. 3. Cardiomegaly with left atrial dilatation. 4. Aortic atherosclerosis, in addition to three-vessel coronary artery disease. Assessment for potential risk factor modification, dietary therapy or pharmacologic therapy may be warranted, if clinically indicated. 5. There are calcifications of the mitral annulus.  Echocardiographic correlation for evaluation of potential valvular dysfunction may be warranted if clinically indicated.   Echo 02/21/17 EF 55-60, no RWMA, trivial AI, MAC, mild MR, severe LAE  Echo 12/07/14 Moderate concentric LVH, EF 60-65, normal wall motion, grade 2 diastolic dysfunction, MAC, mild RAE, PASP 36  Event monitor 9/16 NSR, paroxysmal atrial fibrillation   Past Medical History:  Diagnosis Date  . Atrial fibrillation ([place])   . IBS (irritable bowel syndrome)   . Obesity (BMI 30-39.9) 07/11/2015  . OSA (obstructive sleep apnea) 01/17/2015   Severe with AHI 35.8/hr  . Panic attacks   . Urinary incontinence, nocturnal enuresis     Past Surgical History:  Procedure Laterality Date  . TONSILLECTOMY      Current Medications: Current Meds  Medication Sig  . diltiazem (CARDIZEM CD) 360 MG 24 hr capsule Take 1 capsule (360 mg total) by mouth daily.  Marland Kitchen losartan (COZAAR) 50 MG tablet Take 50 mg by mouth daily. Reported on 07/20/2015  . metoprolol succinate (TOPROL XL) 25 MG 24 hr tablet Take 1 tablet (25 mg total) by mouth daily.  . rivaroxaban (XARELTO) 20 MG TABS tablet Take 1 tablet (20 mg total) by mouth daily with supper.  . sertraline (ZOLOFT) 100 MG tablet Take 100 mg by mouth daily.     Allergies:   Epinephrine and Sulfa antibiotics   Social History   Tobacco Use  . Smoking status: Never Smoker  . Smokeless tobacco: Never Used  Substance Use Topics  . Alcohol use: No  . Drug use: No  Family Hx: The patient's family history includes Breast cancer in her maternal aunt; Heart attack in her father and paternal grandfather; Heart failure in her mother; Hypertension in her maternal grandmother.  ROS:   Please see the history of present illness.    Review of Systems  Gastrointestinal: Positive for constipation and diarrhea.   All other systems reviewed and are negative.   EKGs/Labs/Other Test Reviewed:    EKG:  EKG is  ordered today.  The ekg ordered  today demonstrates atrial fibrillation, HR 83, PVC  Recent Labs: 02/20/2017: B Natriuretic Peptide 387.9; Magnesium 2.0 02/21/2017: BUN 13; Creatinine, Ser 0.82; Hemoglobin 13.4; Platelets 173; Potassium 3.4; Sodium 141; TSH 2.112   Recent Lipid Panel Lab Results  Component Value Date/Time   CHOL 127 02/21/2017 08:36 AM   TRIG 66 02/21/2017 08:36 AM   HDL 37 (L) 02/21/2017 08:36 AM   CHOLHDL 3.4 02/21/2017 08:36 AM   LDLCALC 77 02/21/2017 08:36 AM     Physical Exam:    VS:  BP 128/80   Pulse 89   Ht _0  (1.575 m)   Wt 178 lb 12.8 oz (81.1 kg)   SpO2 96%   BMI 32.70 kg/m     Wt Readings from Last 3 Encounters:  03/26/17 178 lb 12.8 oz (81.1 kg)  03/05/17 181 lb 12.8 oz (82.5 kg)  02/26/17 187 lb 12.8 oz (85.2 kg)     Physical Exam  Constitutional: She is oriented to person, place, and time. She appears well-developed and well-nourished. No distress.  HENT:  Head: Normocephalic and atraumatic.  Eyes: No scleral icterus.  Neck: No JVD present.  Cardiovascular: Normal rate. An irregularly irregular rhythm present.  No murmur heard. Pulmonary/Chest: Effort normal. She has no rales.  Abdominal: Soft.  Musculoskeletal: She exhibits no edema.  Neurological: She is alert and oriented to person, place, and time.  Skin: Skin is warm and dry.    ASSESSMENT & PLAN:    1.  Persistent atrial fibrillation ([place])  She remains in atrial fibrillation today.  Her HR is controlled.  She notes adherence to Xarelto.  She has not missed a dose.  We discussed the risks and benefits of proceeding with cardioversion.  We also discussed the importance of maintaining uninterrupted anticoagulation for 3-4 weeks prior to cardioversion.  -Arrange DCCV next week  -BMET today  -Follow up 2 weeks after DCCV  2.  Chronic diastolic CHF (congestive heart failure) ([place]) She experienced decompensated heart failure in the setting of atrial fibrillation with rapid ventricular rate.  She is well  compensated now with NYHA 2 symptoms.  She is not currently on diuretic therapy.  3.  Essential hypertension The patient's blood pressure is controlled on her current regimen.  Continue current therapy.   4.  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  This was started by the hospitalist while she was admitted.  The notes do indicate she had a urinary tract infection at the time.  Question if her mental status changes were related to her infection.  She does not feel as though she needs to continue on this medication.  I have asked her to discuss this with either her primary care physician or her neurologist.   Dispo:  Return in about 2 weeks (around 04/09/2017) for Post Procedure Follow Up, w/ Dr. Acie Fredrickson, or Richardson Dopp, PA-C.   Medication Adjustments/Labs and Tests Ordered: Current medicines are reviewed at length with the patient  today.  Concerns regarding medicines are outlined above.  Tests Ordered: Orders Placed This Encounter  Procedures  . Basic metabolic panel  . EKG 12-Lead  . EKG 12-Lead   Medication Changes: No orders of the defined types were placed in this encounter.   Signed, Richardson Dopp, PA-C  03/26/2017 12:50 PM    [facility] [address], [place], [place]  [id number] Phone: [phone]; Fax: [phone]    Attending Note:   The patient was seen and examined.  Agree with assessment and plan as noted above.  Changes made to the above note as needed.  Patient seen and independently examined with Richardson Dopp, PA .   We discussed all aspects of the encounter. I agree with the assessment and plan as stated above.  1. Atrial fib:   Pt has been on [facility]  Will schedule her for outpatient cardioversion  She has been reminded of the importance of taking her [place]    I have spent a total of 40 minutes with patient reviewing hospital  notes , telemetry, EKGs, labs and examining  patient as well as establishing an assessment and plan that was discussed with the patient. > 50% of time was spent in direct patient care.    Thayer Headings, Brooke Bonito., MD, [facility] 03/26/2017, 4:20 PM [address]  [place] Pager [phone]

## 2017-03-27 ENCOUNTER — Other Ambulatory Visit: Payer: Self-pay | Admitting: *Deleted

## 2017-03-27 LAB — BASIC METABOLIC PANEL
BUN/Creatinine Ratio: 14 (ref 12–28)
BUN: 10 mg/dL (ref 8–27)
CALCIUM: 10.1 mg/dL (ref 8.7–10.3)
CO2: 27 mmol/L (ref 20–29)
CREATININE: 0.73 mg/dL (ref 0.57–1.00)
Chloride: 103 mmol/L (ref 96–106)
GFR calc Af Amer: 95 mL/min/{1.73_m2} (ref >59)
GFR, EST NON AFRICAN AMERICAN: 83 mL/min/{1.73_m2} (ref >59)
Glucose: 88 mg/dL (ref 65–99)
Potassium: 4.2 mmol/L (ref 3.5–5.2)
SODIUM: 142 mmol/L (ref 134–144)

## 2017-03-27 MED ORDER — RIVAROXABAN 20 MG PO TABS: 20.0000 mg | ORAL_TABLET | Freq: Every day | ORAL | 5 refills | Status: DC

## 2017-03-27 NOTE — Telephone Encounter (Signed)
Age 72 years Wt 81.1kg 03/26/2017 Eyvonne Left PA on 03/26/2017 02/21/2017 Hgb 13.4 HCT 42.7 03/26/2017 SrCr 0.73  CrCl 89.18  Refill done for Xarelto 20 mg daily

## 2017-03-31 ENCOUNTER — Other Ambulatory Visit: Payer: Self-pay

## 2017-03-31 MED ORDER — RIVAROXABAN 20 MG PO TABS: 20.0000 mg | ORAL_TABLET | Freq: Every day | ORAL | 5 refills | Status: DC

## 2017-04-01 MED ORDER — RIVAROXABAN 20 MG PO TABS: 20.0000 mg | ORAL_TABLET | Freq: Every day | ORAL | 5 refills | Status: DC

## 2017-04-01 NOTE — Addendum Note (Signed)
Addended by: Derl Barrow on: 04/01/2017 08:10 AM   Modules accepted: Orders

## 2017-04-01 NOTE — Telephone Encounter (Signed)
Pt called stating that her local pharmacy has not received her medication Xarelto, which was sent to [facility]. I resent pt's medication to requested pharmacy [facility] on [address]. Confirmation received.

## 2017-04-01 NOTE — Telephone Encounter (Signed)
Called pt to inform her that her medication has been resent to her requested pharmacy and if she has any other problems, questions or concerns to call our office.

## 2017-04-02 ENCOUNTER — Ambulatory Visit (HOSPITAL_COMMUNITY)
Admission: RE | Admit: 2017-04-02 | Discharge: 2017-04-02 | Disposition: A | Discharge status: Home / Self Care | Payer: Medicare Other | Source: Ambulatory Visit | Attending: Cardiovascular Disease | Admitting: Cardiovascular Disease

## 2017-04-02 ENCOUNTER — Other Ambulatory Visit: Payer: Self-pay

## 2017-04-02 ENCOUNTER — Ambulatory Visit (HOSPITAL_COMMUNITY): Payer: Medicare Other | Admitting: Anesthesiology

## 2017-04-02 ENCOUNTER — Encounter (HOSPITAL_COMMUNITY): Payer: Self-pay | Admitting: Anesthesiology

## 2017-04-02 ENCOUNTER — Encounter (HOSPITAL_COMMUNITY)
Admission: RE | Disposition: A | Discharge status: Home / Self Care | Payer: Self-pay | Source: Ambulatory Visit | Attending: Cardiovascular Disease

## 2017-04-02 DIAGNOSIS — I110 Hypertensive heart disease with heart failure: Secondary | ICD-10-CM | POA: Diagnosis not present

## 2017-04-02 DIAGNOSIS — I4819 Other persistent atrial fibrillation: Secondary | ICD-10-CM

## 2017-04-02 DIAGNOSIS — Z8249 Family history of ischemic heart disease and other diseases of the circulatory system: Secondary | ICD-10-CM | POA: Diagnosis not present

## 2017-04-02 DIAGNOSIS — I700 Atherosclerosis of aorta: Secondary | ICD-10-CM | POA: Insufficient documentation

## 2017-04-02 DIAGNOSIS — Z7901 Long term (current) use of anticoagulants: Secondary | ICD-10-CM | POA: Diagnosis not present

## 2017-04-02 DIAGNOSIS — Z79899 Other long term (current) drug therapy: Secondary | ICD-10-CM | POA: Diagnosis not present

## 2017-04-02 DIAGNOSIS — F0280 Dementia in other diseases classified elsewhere without behavioral disturbance: Secondary | ICD-10-CM | POA: Diagnosis not present

## 2017-04-02 DIAGNOSIS — I5032 Chronic diastolic (congestive) heart failure: Secondary | ICD-10-CM | POA: Diagnosis not present

## 2017-04-02 DIAGNOSIS — G4733 Obstructive sleep apnea (adult) (pediatric): Secondary | ICD-10-CM | POA: Diagnosis not present

## 2017-04-02 DIAGNOSIS — I481 Persistent atrial fibrillation: Secondary | ICD-10-CM | POA: Diagnosis present

## 2017-04-02 DIAGNOSIS — I480 Paroxysmal atrial fibrillation: Secondary | ICD-10-CM | POA: Insufficient documentation

## 2017-04-02 DIAGNOSIS — Z882 Allergy status to sulfonamides status: Secondary | ICD-10-CM | POA: Insufficient documentation

## 2017-04-02 HISTORY — PX: CARDIOVERSION: SHX1299

## 2017-04-02 SURGERY — CARDIOVERSION
Anesthesia: General

## 2017-04-02 MED ORDER — LIDOCAINE HCL (CARDIAC) 20 MG/ML IV SOLN
INTRAVENOUS | Status: DC | PRN
  Administered 2017-04-02: 60 mg via INTRAVENOUS

## 2017-04-02 MED ORDER — PROPOFOL 10 MG/ML IV BOLUS
INTRAVENOUS | Status: DC | PRN
  Administered 2017-04-02: 60 mg via INTRAVENOUS

## 2017-04-02 MED ORDER — SODIUM CHLORIDE 0.9 % IV SOLN
250.0000 mL | INTRAVENOUS | Status: DC
  Administered 2017-04-02: 10:00:00 via INTRAVENOUS

## 2017-04-02 MED ORDER — HYDROCORTISONE 1 % EX CREA: 1.0000 "application " | TOPICAL_CREAM | Freq: Three times a day (TID) | CUTANEOUS | Status: DC | PRN

## 2017-04-02 MED ORDER — SODIUM CHLORIDE 0.9% FLUSH: 3.0000 mL | Freq: Two times a day (BID) | INTRAVENOUS | Status: DC

## 2017-04-02 MED ORDER — SODIUM CHLORIDE 0.9% FLUSH: 3.0000 mL | INTRAVENOUS | Status: DC | PRN

## 2017-04-02 NOTE — Interval H&P Note (Signed)
History and Physical Interval Note:  04/02/2017 10:53 AM  Beth Haynes  has presented today for surgery, with the diagnosis of AFIB  The various methods of treatment have been discussed with the patient and family. After consideration of risks, benefits and other options for treatment, the patient has consented to  Procedure(s): CARDIOVERSION (N/A) as a surgical intervention .  The patient's history has been reviewed, patient examined, no change in status, stable for surgery.  I have reviewed the patient's chart and labs.  Questions were answered to the patient's satisfaction.     Skeet Latch , MD

## 2017-04-02 NOTE — Anesthesia Preprocedure Evaluation (Addendum)
Anesthesia Evaluation  Patient identified by MRN, date of birth, ID band Patient awake    Reviewed: Allergy & Precautions, NPO status , Patient's Chart, lab work & pertinent test results  Airway Mallampati: II  TM Distance: >3 FB Neck ROM: full    Dental  (+) Teeth Intact, Dental Advisory Given   Pulmonary sleep apnea ,    breath sounds clear to auscultation       Cardiovascular hypertension, +CHF  + dysrhythmias Atrial Fibrillation  Rhythm:irregular Rate:Normal     Neuro/Psych PSYCHIATRIC DISORDERS Anxiety Depression    GI/Hepatic   Endo/Other    Renal/GU      Musculoskeletal   Abdominal   Peds  Hematology   Anesthesia Other Findings   Reproductive/Obstetrics                            Anesthesia Physical Anesthesia Plan  ASA: III  Anesthesia Plan: General   Post-op Pain Management:    Induction: Intravenous  PONV Risk Score and Plan: 3 and Treatment may vary due to age or medical condition and Propofol infusion  Airway Management Planned: Mask  Additional Equipment:   Intra-op Plan:   Post-operative Plan:   Informed Consent: I have reviewed the patients History and Physical, chart, labs and discussed the procedure including the risks, benefits and alternatives for the proposed anesthesia with the patient or authorized representative who has indicated his/her understanding and acceptance.     Plan Discussed with: CRNA and Anesthesiologist  Anesthesia Plan Comments:         Anesthesia Quick Evaluation

## 2017-04-02 NOTE — Transfer of Care (Signed)
Immediate Anesthesia Transfer of Care Note  Patient: Beth Haynes  Procedure(s) Performed: CARDIOVERSION (N/A )  Patient Location: Endoscopy Unit  Anesthesia Type:General  Level of Consciousness: awake, oriented and patient cooperative  Airway & Oxygen Therapy: Patient Spontanous Breathing and Patient connected to nasal cannula oxygen  Post-op Assessment: Report given to RN and Post -op Vital signs reviewed and stable  Post vital signs: Reviewed  Last Vitals:  Vitals:   04/02/17 1110 04/02/17 1111  BP:    Pulse: (!) 47 (!) 46  Resp: 17 17  Temp:    SpO2: 100% 99%    Last Pain:  Vitals:   04/02/17 1020  TempSrc: Oral         Complications: No apparent anesthesia complications

## 2017-04-02 NOTE — Anesthesia Procedure Notes (Signed)
Procedure Name: General with mask airway Date/Time: 04/02/2017 10:57 AM Performed by: Jenne Campus, CRNA Pre-anesthesia Checklist: Patient identified, Emergency Drugs available, Suction available, Patient being monitored and Timeout performed Patient Re-evaluated:Patient Re-evaluated prior to induction Oxygen Delivery Method: Ambu bag Preoxygenation: Pre-oxygenation with 100% oxygen Induction Type: IV induction Ventilation: Mask ventilation without difficulty

## 2017-04-02 NOTE — Discharge Instructions (Signed)
Electrical Cardioversion, Care After °This sheet gives you information about how to care for yourself after your procedure. Your health care provider may also give you more specific instructions. If you have problems or questions, contact your health care provider. °What can I expect after the procedure? °After the procedure, it is common to have: °· Some redness on the skin where the shocks were given. ° °Follow these instructions at home: °· Do not drive for 24 hours if you were given a medicine to help you relax (sedative). °· Take over-the-counter and prescription medicines only as told by your health care provider. °· Ask your health care provider how to check your pulse. Check it often. °· Rest for 48 hours after the procedure or as told by your health care provider. °· Avoid or limit your caffeine use as told by your health care provider. °Contact a health care provider if: °· You feel like your heart is beating too quickly or your pulse is not regular. °· You have a serious muscle cramp that does not go away. °Get help right away if: °· You have discomfort in your chest. °· You are dizzy or you feel faint. °· You have trouble breathing or you are short of breath. °· Your speech is slurred. °· You have trouble moving an arm or leg on one side of your body. °· Your fingers or toes turn cold or blue. °This information is not intended to replace advice given to you by your health care provider. Make sure you discuss any questions you have with your health care provider. °Document Released: 11/18/2012 Document Revised: 09/01/2015 Document Reviewed: 08/04/2015 °Elsevier Interactive Patient Education © 2018 Else[employer] ° °

## 2017-04-02 NOTE — CV Procedure (Signed)
Electrical Cardioversion Procedure Note KALEIA LONGHI [id number] [birth year]  Procedure: Electrical Cardioversion Indications:  Atrial Fibrillation  Procedure Details Consent: Risks of procedure as well as the alternatives and risks of each were explained to the (patient/caregiver).  Consent for procedure obtained. Time Out: Verified patient identification, verified procedure, site/side was marked, verified correct patient position, special equipment/implants available, medications/allergies/relevent history reviewed, required imaging and test results available.  Performed  Patient placed on cardiac monitor, pulse oximetry, supplemental oxygen as necessary.  Sedation given: propofol Pacer pads placed anterior and posterior chest.  Cardioverted 1 time(s).  Cardioverted at 150J.  Evaluation Findings: Post procedure EKG shows: Sinus bradycardia Complications: None Patient did tolerate procedure well.   Skeet Latch, MD 04/02/2017, 11:04 AM

## 2017-04-03 ENCOUNTER — Encounter (HOSPITAL_COMMUNITY): Payer: Self-pay | Admitting: Cardiovascular Disease

## 2017-04-03 NOTE — Anesthesia Postprocedure Evaluation (Signed)
Anesthesia Post Note  Patient: Mirjana H Shrestha  Procedure(s) Performed: CARDIOVERSION (N/A )     Patient location during evaluation: Endoscopy Anesthesia Type: General Level of consciousness: awake and alert Pain management: pain level controlled Vital Signs Assessment: post-procedure vital signs reviewed and stable Respiratory status: spontaneous breathing, nonlabored ventilation, respiratory function stable and patient connected to nasal cannula oxygen Cardiovascular status: blood pressure returned to baseline and stable Postop Assessment: no apparent nausea or vomiting Anesthetic complications: no    Last Vitals:  Vitals:   04/02/17 1125 04/02/17 1135  BP: (!) 143/60 (!) 149/79  Pulse: (!) 49   Resp: 15   Temp:    SpO2: 94% 97%    Last Pain:  Vitals:   04/02/17 1111  TempSrc: Oral                 Ceirra Belli S

## 2017-04-14 ENCOUNTER — Encounter: Payer: Self-pay | Admitting: Physician Assistant

## 2017-04-14 ENCOUNTER — Ambulatory Visit: Payer: Medicare Other | Admitting: Physician Assistant

## 2017-04-14 VITALS — BP 128/62 | HR 63 | Ht 62.0 in | Wt 179.1 lb

## 2017-04-14 DIAGNOSIS — I5032 Chronic diastolic (congestive) heart failure: Secondary | ICD-10-CM | POA: Diagnosis not present

## 2017-04-14 DIAGNOSIS — I10 Essential (primary) hypertension: Secondary | ICD-10-CM

## 2017-04-14 DIAGNOSIS — I481 Persistent atrial fibrillation: Secondary | ICD-10-CM

## 2017-04-14 DIAGNOSIS — I4819 Other persistent atrial fibrillation: Secondary | ICD-10-CM

## 2017-04-14 NOTE — Progress Notes (Signed)
Cardiology Office Note:    Date:  04/14/2017   ID:  Beth Haynes, DOB [birth year], MRN [id number]  PCP:  Velna Hatchet, MD  Cardiologist:  Mertie Moores, MD   Referring MD: Velna Hatchet, MD   Chief Complaint  Patient presents with  . Follow-up    Atrial fibrillation status post cardioversion    History of Present Illness:    Beth Haynes is a 72 y.o. female with paroxysmal atrial fibrillation, hypertension, obstructive sleep apnea.  CHADS2-VASc=3 (female, 72 yo, HTN).  She was admitted in 02/2017 for decompensated diastolic congestive heart failure in the setting of atrial fibrillation with rapid rate and UTI.  She had been nonadherent to Eliquis and was switched to Xarelto.  She was ultimately set up for elective cardioversion after an appropriate interval of uninterrupted anticoagulation.  This was performed 04/02/17 with restoration of normal sinus rhythm.  Beth Haynes returns for follow-up on atrial fibrillation after recent cardioversion.  She is here with her husband.  She has felt better since the cardioversion.  She denies any issues with fatigue, shortness of breath, chest pain.  She denies PND or syncope.  She denies any bleeding issues.  Prior CV studies:   The following studies were reviewed today:  Chest CTA 02/20/17 IMPRESSION: 1. No evidence of pulmonary embolism. 2. The appearance of the chest suggests congestive heart failure, including a background of interstitial pulmonary edema, as well as moderate right and small left pleural effusions. 3. Cardiomegaly with left atrial dilatation. 4. Aortic atherosclerosis, in addition to three-vessel coronary artery disease. Assessment for potential risk factor modification, dietary therapy or pharmacologic therapy may be warranted, if clinically indicated. 5. There are calcifications of the mitral annulus. Echocardiographic correlation for evaluation of potential valvular dysfunction may be warranted if clinically  indicated.  Echo 02/21/17 EF 55-60, no RWMA, trivial AI, MAC, mild MR, severe LAE  Echo 12/07/14 Moderate concentric LVH, EF 60-65, normal wall motion, grade 2 diastolic dysfunction, MAC, mild RAE, PASP 36  Event monitor 9/16 NSR, paroxysmal atrial fibrillation  Past Medical History:  Diagnosis Date  . Atrial fibrillation ([place])   . IBS (irritable bowel syndrome)   . Obesity (BMI 30-39.9) 07/11/2015  . OSA (obstructive sleep apnea) 01/17/2015   Severe with AHI 35.8/hr  . Panic attacks   . Urinary incontinence, nocturnal enuresis     Past Surgical History:  Procedure Laterality Date  . CARDIOVERSION N/A 04/02/2017   Procedure: CARDIOVERSION;  Surgeon: Skeet Latch, MD;  Location: [facility] ENDOSCOPY;  Service: Cardiovascular;  Laterality: N/A;  . TONSILLECTOMY      Current Medications: Current Meds  Medication Sig  . diltiazem (CARDIZEM CD) 360 MG 24 hr capsule Take 1 capsule (360 mg total) by mouth daily.  Marland Kitchen donepezil (ARICEPT) 5 MG tablet Take 1 tablet (5 mg total) by mouth at bedtime.  Marland Kitchen losartan (COZAAR) 50 MG tablet Take 50 mg by mouth daily. Reported on 07/20/2015  . metoprolol succinate (TOPROL XL) 25 MG 24 hr tablet Take 1 tablet (25 mg total) by mouth daily.  . rivaroxaban (XARELTO) 20 MG TABS tablet Take 1 tablet (20 mg total) by mouth daily with supper.  . sertraline (ZOLOFT) 100 MG tablet Take 200 mg by mouth at bedtime.      Allergies:   Sulfa antibiotics and Epinephrine   Social History   Tobacco Use  . Smoking status: Never Smoker  . Smokeless tobacco: Never Used  Substance Use Topics  . Alcohol use: No  . Drug  use: No     Family Hx: The patient's family history includes Breast cancer in her maternal aunt; Heart attack in her father and paternal grandfather; Heart failure in her mother; Hypertension in her maternal grandmother.  ROS:   Please see the history of present illness.    ROS All other systems reviewed and are negative.   EKGs/Labs/Other  Test Reviewed:    EKG:  EKG is  ordered today.  The ekg ordered today demonstrates atrial fibrillation, HR 63  Recent Labs: 02/20/2017: B Natriuretic Peptide 387.9; Magnesium 2.0 02/21/2017: Hemoglobin 13.4; Platelets 173; TSH 2.112 03/26/2017: BUN 10; Creatinine, Ser 0.73; Potassium 4.2; Sodium 142   Recent Lipid Panel Lab Results  Component Value Date/Time   CHOL 127 02/21/2017 08:36 AM   TRIG 66 02/21/2017 08:36 AM   HDL 37 (L) 02/21/2017 08:36 AM   CHOLHDL 3.4 02/21/2017 08:36 AM   LDLCALC 77 02/21/2017 08:36 AM    Physical Exam:    VS:  BP 128/62   Pulse 63   Ht 5' 2"  (1.575 m)   Wt 179 lb 1.9 oz (81.2 kg)   SpO2 94%   BMI 32.76 kg/m     Wt Readings from Last 3 Encounters:  04/14/17 179 lb 1.9 oz (81.2 kg)  04/02/17 178 lb (80.7 kg)  03/26/17 178 lb 12.8 oz (81.1 kg)     Physical Exam  Constitutional: She is oriented to person, place, and time. She appears well-developed and well-nourished. No distress.  HENT:  Head: Normocephalic and atraumatic.  Neck: Normal range of motion. No JVD present.  Cardiovascular: Normal rate, S1 normal, S2 normal and normal heart sounds. An irregularly irregular rhythm present.  No murmur heard. Pulmonary/Chest: Breath sounds normal. She has no wheezes. She has no rhonchi. She has no rales.  Abdominal: Soft. There is no tenderness.  Musculoskeletal: She exhibits no edema.  Neurological: She is alert and oriented to person, place, and time.  Skin: Skin is warm and dry.    ASSESSMENT & PLAN:    #1.  Persistent atrial fibrillation ([place] [facility] [place]) She is status post recent cardioversion.  Today, she is back in atrial fibrillation.  Overall, she seems to be asymptomatic.  When she was admitted with decompensated heart failure, her heart rate was uncontrolled in the setting of urinary tract infection.  She is now on 2 rate controlling medications with diltiazem and metoprolol.  Her heart rate is well controlled.  She is adequately anticoagulated  with Rivaroxaban.  At this point, I think rate control would be appropriate.  I had a long discussion with her and her husband regarding this.  -Continue Rivaroxaban, diltiazem, metoprolol succinate  #2.  Chronic diastolic CHF (congestive heart failure) (HCC) Volume status stable.  She is not currently on diuretic therapy.  #3.  Essential hypertension The patient's blood pressure is controlled on her current regimen.  Continue current therapy.    Dispo:  Return in about 3 months (around 07/15/2017) for Routine Follow Up, w/ Dr. Acie Fredrickson, or Richardson Dopp, PA-C.   Medication Adjustments/Labs and Tests Ordered: Current medicines are reviewed at length with the patient today.  Concerns regarding medicines are outlined above.  Tests Ordered: Orders Placed This Encounter  Procedures  . EKG 12-Lead   Medication Changes: No orders of the defined types were placed in this encounter.   Signed, Richardson Dopp, PA-C  04/14/2017 4:31 PM    [facility] [place], [place], [place]  [address] Phone: [phone]; Fax: [phone]    

## 2017-04-14 NOTE — Patient Instructions (Signed)
Medication Instructions:  Your physician recommends that you continue on your current medications as directed. Please refer to the Current Medication list given to you today.   Labwork: NONE ORDERED TODAY   Testing/Procedures: NONE ORDERED TODAY  Follow-Up: DR. Acie Fredrickson July 23, 2017 @ 9:20 AM   Any Other Special Instructions Will Be Listed Below (If Applicable).     If you need a refill on your cardiac medications before your next appointment, please call your pharmacy.

## 2017-05-04 ENCOUNTER — Encounter: Payer: Self-pay | Admitting: Neurology

## 2017-05-06 ENCOUNTER — Encounter: Payer: Self-pay | Admitting: Neurology

## 2017-05-06 ENCOUNTER — Ambulatory Visit: Payer: Medicare Other | Admitting: Neurology

## 2017-05-06 VITALS — BP 138/84 | HR 78 | Ht 62.0 in | Wt 183.0 lb

## 2017-05-06 DIAGNOSIS — Z789 Other specified health status: Secondary | ICD-10-CM | POA: Diagnosis not present

## 2017-05-06 DIAGNOSIS — I480 Paroxysmal atrial fibrillation: Secondary | ICD-10-CM | POA: Diagnosis not present

## 2017-05-06 DIAGNOSIS — R634 Abnormal weight loss: Secondary | ICD-10-CM

## 2017-05-06 DIAGNOSIS — G4731 Primary central sleep apnea: Secondary | ICD-10-CM | POA: Diagnosis not present

## 2017-05-06 NOTE — Progress Notes (Signed)
SLEEP MEDICINE CLINIC   Provider:  Larey Seat, M D  Primary Care Physician:  Velna Hatchet, MD   Referring Provider: Velna Hatchet, MD     HPI: RV from 05-06-2017, for Beth Haynes seen here with her husband.  Beth Haynes is a 72 y.o. female , who has been for the first time ever hospitalized in January of this year.  She had presented to her primary care physician who referred her by ambulance across the road to the hospital.   Until then she had used her ASV compliantly, after 3 days in the hospital for a cardiac workup she returned home. The physician asked her orientation questions, and she apparently couldn't answer- was diagnosed with dementia ! Atrial fibrillation, CHF and she underwent cardioversion, remains anticoagulated. She is back in a fib. She developed urinary incontinence, nocturia has slowed down. She still struggles with the CPAP since her admission- unsure why- but I noticed she lost a lot of weight- 50 pounds and she may not need the CPAP or not at current settings. There is no evidence of dementia now, may be it was delirium? She could not tolerate the large CPAP mask- no longer fits.  She has returned to the Eson nasal Mask in medium.    ASV is set at 5 cm EPAP, minimum pressure support of  3 cm and maximum 15 cm water pressure. AHI is 18.6 on this setting,  high air leaks. 05-04-2017.    HST will be ordered to confirm to need for CPAP or not.      I have the pleasure of seeing Beth Haynes today on 10/30/2016 in a follow-up visit after CPAP titration performed on 06/25/2016, the patient was titrated first to CPAP but then this was switched to a BiPAP setting which she tolerated better. She did very well with a ASV setting of 5 cm EPAP, a minimum pressure support of 3 cm and a maximum pressure support of 15 cm. She also had less and less arousals as the night went on not just due to respiratory factors but also due to an increased oxygen nadir. She was fitted with an  Eson nasal mask. She tolerated ASV much better than BiPAP. I have now the pleasure of seeing the first compliance visit the patient has used the machine 83% for the last 30 days over 4 hours with an average user time of 6 hours and 7 minutes, ASV settings as quoted above her residual AHI is 4.6 of which 3.5 by hypopneas.      Consult: seen here as in a referral from Dr. Ardeth Perfect for a new sleep apnea evaluation.   Chief complaint according to patient : "I was diagnosed with apnea but could not get CPAP to work right". Beth Haynes husband is also a patient of my sleep practice, she was referred by her cardiologist, Dr. Cathie Olden, for sleep evaluation to the [place] sleep lab. She reports that atrial fibrillation was her main risk factor. In addition she had hypertension and a body mass index exceeding 30. Atrial fibrillation has been persistent, she is chronically anticoagulated on Eloquis. The patient reportedly has a cardiac valve regurgitation, diagnosed by echocardiogram but I do not have the original diagnostic tests available.The patient also has a history of hyperlipidemia, panic attacks,, irritable bowel syndrome, hypertension, urinary incontinence.   Sleep habits are as follows: The patient likes to read and she goes to bed but she falls asleep very promptly and feels that she  can sleep uninterrupted through the night. She remembers vivid, beautiful dreams not nightmarish in character but colorful and very interesting.  h she shares the bedroom with her husband , who reports her to snore very, very loudly. He is hard of hearing and not normally bothered.  She usually retreats to her bedroom by about 10 PM starts to read and is asleep within 15-30 minutes. She prefers to sleep on her back and on her side. She sleeps on 2 thin, firm pillows. Her husband reports that she seems to be gasping for air and still snoring while using CPAP.  Sleep medical history and family sleep history: Beth Haynes  maternal grandmother died at age 29 of a sudden cardiac death, her mother collapsed and fell hitting her had never regaining consciousness died at age 40. Beth Haynes has undergone a tonsillectomy in 1963, she had frequent falls and broken both ankles, left wrist and left collarbone. Social history: married, retired, does not drink caffeine, lifelong non smoker, ETOH seldomly.   Her cholesterol and her last labs from April was 188 total, triglycerides 101, HDL was 44, creatinine was 0.7 liver function tests only marginal elevation of ALT at 41 units per liter was noted. She is mildly leukopenic at 3.97K, but she does have no evidence of anemia. TSH was in normal limits. I reviewed the patient's previous to sleep studies she was admitted for a split night polysomnography on 01/11/2015. The patient had frequent hypopneas and apneas the RDI was 56.7 which is very high. She did not have Cheyne-Stokes breathing of central apneas. She seemed to have slept all night in supine sleep - She was titrated to CPAP and establishing a high pressure of 19 cm water,  and her AHI did not respond it remained in the 2 digits. Her AHI was 65.5 on CPAP of 19 cm water she was changed to bilevel and remained with an AHI in the 60s under 23/19 cm water she was asked back for full night BiPAP titration on 04/11/2015 and her RDI exacerbated to 44.5;  this same procedure of a bilevel titration again to a pressure of 19/15 cm water resulted in a residual AHI of 26.7.  Oxygen nadir was 90% saturation.  Overall,  the patient barely slept and she continued to have difficulties using the machine at home. An optimal pressure was never identified.  Review of Systems:  Out of a complete 14 system review, the patient complains of only the following symptoms, and all other reviewed systems are negative. Snoring. Hypersomnia, excessive sleepiness. Epworth score 12 with vivid dreams now on ASV reduced to 12/24, Fatigue severity score 24 points.    Social History   Socioeconomic History  . Marital status: Married    Spouse name: Not on file  . Number of children: Not on file  . Years of education: Not on file  . Highest education level: Not on file  Occupational History  . Not on file  Social Needs  . Financial resource strain: Not on file  . Food insecurity:    Worry: Not on file    Inability: Not on file  . Transportation needs:    Medical: Not on file    Non-medical: Not on file  Tobacco Use  . Smoking status: Never Smoker  . Smokeless tobacco: Never Used  Substance and Sexual Activity  . Alcohol use: No  . Drug use: No  . Sexual activity: Not on file  Lifestyle  . Physical activity:  Days per week: Not on file    Minutes per session: Not on file  . Stress: Not on file  Relationships  . Social connections:    Talks on phone: Not on file    Gets together: Not on file    Attends religious service: Not on file    Active member of club or organization: Not on file    Attends meetings of clubs or organizations: Not on file    Relationship status: Not on file  . Intimate partner violence:    Fear of current or ex partner: Not on file    Emotionally abused: Not on file    Physically abused: Not on file    Forced sexual activity: Not on file  Other Topics Concern  . Not on file  Social History Narrative  . Not on file    Family History  Problem Relation Age of Onset  . Heart failure Mother   . Heart attack Father   . Hypertension Maternal Grandmother   . Heart attack Paternal Grandfather   . Breast cancer Maternal Aunt     Past Medical History:  Diagnosis Date  . Atrial fibrillation ([place])   . IBS (irritable bowel syndrome)   . Obesity (BMI 30-39.9) 07/11/2015  . OSA (obstructive sleep apnea) 01/17/2015   Severe with AHI 35.8/hr  . Panic attacks   . Urinary incontinence, nocturnal enuresis     Past Surgical History:  Procedure Laterality Date  . CARDIOVERSION N/A 04/02/2017   Procedure:  CARDIOVERSION;  Surgeon: Skeet Latch, MD;  Location: [place];  Service: Cardiovascular;  Laterality: N/A;  . TONSILLECTOMY      Current Outpatient Medications  Medication Sig Dispense Refill  . diltiazem (CARDIZEM CD) 360 MG 24 hr capsule Take 1 capsule (360 mg total) by mouth daily. 90 capsule 3  . donepezil (ARICEPT) 5 MG tablet Take 1 tablet (5 mg total) by mouth at bedtime. 30 tablet 0  . losartan (COZAAR) 50 MG tablet Take 50 mg by mouth daily. Reported on 07/20/2015  5  . metoprolol succinate (TOPROL XL) 25 MG 24 hr tablet Take 1 tablet (25 mg total) by mouth daily. 30 tablet 11  . rivaroxaban (XARELTO) 20 MG TABS tablet Take 1 tablet (20 mg total) by mouth daily with supper. 30 tablet 5  . sertraline (ZOLOFT) 100 MG tablet Take 200 mg by mouth at bedtime.      No current facility-administered medications for this visit.     Allergies as of 05/06/2017 - Review Complete 04/14/2017  Allergen Reaction Noted  . Sulfa antibiotics  08/24/2014  . Epinephrine  06/12/2016    Vitals: BP 138/84   Pulse 78   Ht 5\' 2"  (1.575 m)   Wt 183 lb (83 kg)   BMI 33.47 kg/m  Last Weight:  Wt Readings from Last 1 Encounters:  05/06/17 183 lb (83 kg)   TIR:WERX mass index is 33.47 kg/m.     Last Height:   Ht Readings from Last 1 Encounters:  05/06/17 5\' 2"  (1.575 m)    Physical exam:  General: The patient is awake, alert and appears not in acute distress. The patient is well groomed. Head: Normocephalic, atraumatic. Neck is supple. Mallampati 2  neck circumference:15. Nasal airflow congested, but patent, TMJ click is not evident. Retrognathia is not seen.  Cardiovascular: irregular rate and rhythm, without  murmurs or carotid bruit, and without distended neck veins. Respiratory: Lungs are clear to auscultation. Skin:  Without evidence of edema,  or rash Trunk: BMI is elevated. The patient's posture is erect.  Neurologic exam : The patient is awake and alert, oriented to place  and time.   Memory subjective described as intact. Attention span & concentration ability appears normal.  Speech is fluent,  without dysarthria, dysphonia or aphasia.  Mood and affect are appropriate.  Cranial nerves: Pupils are equal and briskly reactive to light. Extraocular movements  in vertical and horizontal planes intact and without nystagmus. Visual fields by finger perimetry are intact. Hearing intact  Facial sensation intact to fine touch. Facial motor strength is symmetric and tongue and uvula move midline. Shoulder shrug was symmetrical.  Strength within normal limits. Stance is stable and normal.  Deep tendon reflexes: in the  upper and lower extremities are brisk, mostly on the left -intact. Babinski maneuver response is downgoing.   Assessment:  After physical and neurologic examination, review of laboratory studies,  Personal review of imaging studies, reports of other /same  Imaging studies, results of polysomnography and / or neurophysiology testing and pre-existing records as far as provided in visit., my assessment is    In summary, Mrs. Horsman has several risk factors and obviously has been diagnosed with severe sleep apnea but she could not respond to CPAP nor BiPAP.  She is now on ASV and initially much better- but after her cardiac decompensation and hopital admission lost 50 pounds - and now her mask is not fitting, she I has high air leaks, high residual AHI  Of 18 plus.   The patient was advised of the nature of the diagnosed disorder , the treatment options and the  risks for general health and wellness arising from not treating the condition. She has treatment emergent central sleep apnea.  I spent more than 30 minutes of face to face time with the patient. Greater than 50% of time was spent in counseling and coordination of care. We have discussed the diagnosis and differential and I answered the patient's questions.    Plan:  Treatment plan and additional workup : I  like for her to continue on ASV since she cannot tolerate BiPAP.   I will ask her to come in for a new titration with 2 hour baseline-  Confirm the degree of apnea and the need of pressure.     Patient agrees.    Larey Seat, MD 9/93/7169, 6:78 PM   Certified in Neurology by ABPN Certified in [place] by [facility] [address], [place], [place] [address]

## 2017-06-06 ENCOUNTER — Other Ambulatory Visit: Payer: Self-pay

## 2017-06-06 MED ORDER — METOPROLOL SUCCINATE ER 25 MG PO TB24
25.0000 mg | ORAL_TABLET | Freq: Every day | ORAL | 0 refills | Status: DC
Start: 2017-06-06 — End: 2017-10-23

## 2017-06-09 ENCOUNTER — Ambulatory Visit (INDEPENDENT_AMBULATORY_CARE_PROVIDER_SITE_OTHER): Payer: Medicare Other | Admitting: Neurology

## 2017-06-09 DIAGNOSIS — G4731 Primary central sleep apnea: Secondary | ICD-10-CM | POA: Diagnosis not present

## 2017-06-09 DIAGNOSIS — I480 Paroxysmal atrial fibrillation: Secondary | ICD-10-CM

## 2017-06-09 DIAGNOSIS — Z789 Other specified health status: Secondary | ICD-10-CM

## 2017-06-09 DIAGNOSIS — G4739 Other sleep apnea: Secondary | ICD-10-CM

## 2017-06-09 DIAGNOSIS — R634 Abnormal weight loss: Secondary | ICD-10-CM

## 2017-06-22 NOTE — Addendum Note (Signed)
Addended by: Larey Seat on: 06/22/2017 03:41 PM   Modules accepted: Orders

## 2017-06-22 NOTE — Procedures (Signed)
PATIENT'S NAME:  Beth Haynes, Beth Haynes DOB:                   06/10/1898      MR#:    [id number]     DATE OF RECORDING: 06/09/2017 REFERRING M.D.:  Velna Hatchet, M.D. Study Performed:  Split-Night Titration Study, Using PAP and ASV.  HISTORY:   Beth Haynes is a 72 y.o. female patient, who has been for the first time ever hospitalized in January of 2019. She presented with atrial fibrillation, CHF and she underwent cardioversion, remains anticoagulated. She is back in a fib. She developed urinary incontinence, but nocturia has slowed down. Until then she had used her ASV compliantly, she returned home after 3 days in the hospital after a cardiac workup. The ED physician had asked her orientation questions, and she apparently couldn't answer- was diagnosed with dementia!  She still struggles with the CPAP since her admission- I noticed she lost a lot of weight- water weight (?) 50 pounds and she may not need the CPAP or not at current settings. There is no evidence of dementia today -it was delirium.  She cannot use the large CPAP mask-it no longer fits.  She has returned to the [employer] in medium.  The patient endorsed the Epworth Sleepiness Scale at 12 points   The patient's weight 183 pounds with a height of 62 (inches), resulting in a BMI of 33.7 kg/m2. The patient's neck circumference measured 15 inches.  CURRENT MEDICATIONS: Eliquis, Cozaar, Zoloft, Vesicare   PROCEDURE:  This is a multichannel digital polysomnogram utilizing the Somnostar 11.2 system.  Electrodes and sensors were applied and monitored per AASM Specifications.   EEG, EOG, Chin and Limb EMG, were sampled at 200 Hz.  ECG, Snore and Nasal Pressure, Thermal Airflow, Respiratory Effort, CPAP Flow and Pressure, Oximetry was sampled at 50 Hz. Digital video and audio were recorded.      BASELINE STUDY WITHOUT CPAP RESULTS:  Lights Out was at 22:42 and Lights On at 05:12.  Total recording time (TRT) was 109, with a total sleep time (TST) of  78 minutes.   The patient's sleep latency was 24.5 minutes.  REM latency was 0 minutes.  The sleep efficiency was 71.6 %.    SLEEP ARCHITECTURE: WASO (Wake after sleep onset) was 16.5 minutes, Stage N1 was 8.5 minutes, Stage N2 was 5.5 minutes, Stage N3 was 64 minutes and Stage R (REM sleep) was 0 minutes.  The percentages were Stage N1 10.9%, Stage N2 7.1%, Stage N3 82.1% and Stage R (REM sleep) 0%.   RESPIRATORY ANALYSIS:  There were a total of 93 respiratory events:  10 obstructive apneas, 23 central apneas and 12 mixed apneas with a total of 45 apneas. There were 48 hypopneas with 0 respiratory event related arousals (RERAs). The total APNEA/HYPOPNEA INDEX (AHI) was 71.5 /hour and the total RESPIRATORY DISTURBANCE INDEX was 71.5 /hour.  0 events occurred in REM sleep and 131 events in NREM. The REM AHI was 0, /hour versus a non-REM AHI of 71.5 /hour. The patient spent 253 minutes sleep time in the supine position 45 minutes in non-supine. The supine AHI was 71.5 /hour versus a non-supine AHI of 0.0 /hour.  OXYGEN SATURATION & C02:  The wake baseline 02 saturation was 95%, with the lowest being 84%. Time spent below 89% saturation equaled 56 minutes.   PERIODIC LIMB MOVEMENTS: The patient had a total of 90 Periodic Limb Movements.  The Periodic Limb Movement (PLM)  index was 69.2 /hour and the PLM Arousal index was 3.1 /hour. The arousals were noted as: 0 were spontaneous, 4 were associated with PLMs, and 73 were associated with respiratory events. Audio and video analysis did not show any abnormal or unusual movements, behaviors, phonations or vocalizations. No nocturia.   TITRATION STUDY WITH CPAP RESULTS: patient failed plain CPAP and BiPAP.    CPAP was initiated at 5 cmH20 with heated humidity per AASM split night standards and CPAP pressure was advanced to 6 cm where more central apneas emerged. The technologist changed to BiPAP10/6 cm water, followed by BiPAP ST - exploring pressures from 10/6  with 12 RR to 13/9 with 12 RR per minute. AHI remained around 10/h. ASV was initiated , beginning at 15 cm maximum pressure support and reduced to 12 cm pressure support - the pat8iet did best at 12 max pressure, 3 cm minimum pressure and 5 cm EPAP.   because of a reduction in central apneas- hypopneas, and desaturations.   At a PAP pressure of 12/5/3 cmH20, there was a reduction of the AHI to 8/hour. In non supine sleep the AHI was reduced by more than half. A FFM was used, a Respironics Dream Wear FFM in small size.  Total recording time (TRT) was 281 minutes, with a total sleep time (TST) of 220 minutes. The patient's sleep latency was 16.5 minutes. REM latency was 43.5 minutes.  The sleep efficiency was 78.3 %.    SLEEP ARCHITECTURE: Wake after sleep was 51.5 minutes, Stage N1 24.5 minutes, Stage N2 30.5 minutes, Stage N3 133.5 minutes and Stage R (REM sleep) 31.5 minutes. The percentages were: Stage N1 11.1%, Stage N2 13.9%, Stage N3 60.7% and Stage R (REM sleep) 14.3%. The sleep architecture was notable for REM rebound.  The arousals were noted as: 20 were spontaneous, 29 were associated with PLMs, and 30 were associated with respiratory events. There were a total of 69 respiratory events: 0 obstructive apneas, 65 central apneas and 4 mixed apneas with a total of 69 apneas and an apnea index (AI) of 18.8. There were 0 hypopneas with a hypopnea index of 0 /hour. The patient also had 0 respiratory event related arousals (RERAs).      The total APNEA/HYPOPNEA INDEX (AHI) was 18.8 /hour and the total RESPIRATORY DISTURBANCE INDEX was 18.8 /hour. 6 events occurred in REM sleep and 63 events in NREM. The REM AHI was 11.4 /hour versus a non-REM AHI of 20.1 /hour. The patient spent 80% of total sleep time in the supine position. The supine AHI was 21.6 /hour, versus a non-supine AHI of 8.0/hour.  OXYGEN SATURATION & C02:  The wake baseline 02 saturation was 94%, with the lowest being 81%. Time spent below  89% saturation equaled 65 minutes.  PERIODIC LIMB MOVEMENTS: The patient had a total of 172 Periodic Limb Movements. The Periodic Limb Movement (PLM) index was 46.9 /hour and the PLM Arousal index was 7.9 /hour.  The patient was fitted with a small FFM Dreamwear apparatus.    POLYSOMNOGRAPHY IMPRESSION :   1. 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 Changed from CPAP to BiPAP, BiPAP ST and finally ASV were made. There was a slight improvement in previously severe hypoxemia, and a reduction to AHI of 8.0/ h under the above named setting.  2. Central Sleep Apnea persisted even after weight loss ( 50 pounds).   3. Severe Periodic Limb Movement Disorder (PLMD).  RECOMMENDATIONS: The patient will return to using ASV , but for the time being on a FFM.  The patient developed [place] breathing and oral air leak- and needs to avoid the supine position for best effect.  We will meet once the ASV has been reset and used for a while.  PLMs can be addressed in that visit.   A follow up appointment will be scheduled in the Sleep Clinic at [facility].     I certify that I have reviewed the entire raw data recording prior to the issuance of this report in accordance with the Standards of Accreditation of the American Academy of Sleep Medicine (AASM)    Larey Seat, M.D.  06-22-2017  Diplomat, American Board of Psychiatry and Neurology  Diplomat, [place] of Sleep Medicine Medical Director, [place] [facility]

## 2017-06-23 ENCOUNTER — Telehealth: Payer: Self-pay | Admitting: Neurology

## 2017-06-23 NOTE — Telephone Encounter (Signed)
-----   Message from Larey Seat, MD sent at 06/22/2017  3:41 PM EDT -----  This was a complicated titration . The baseline AHI was severe , over 70 and complex apnea was noted. The plain CPAP and BiPAP was not helpful and more central apneas emerged. Best tolerated and efficacious was again  ASV - PAP pressure of 12/5/3 cmH20, there was a reduction of the AHI to 8/hour. In non supine sleep the AHI was further reduced by more than half. A FFM was used during this titration, a Respironics Dream Wear FFM in small size. If the patient can tolerate the ESON mask better, I will leave that option for her. CD   order for resetting ASV  To a PAP pressure of 12/5/3 cmH20, Respironics Dream Wear FFM in small size. CD

## 2017-06-23 NOTE — Telephone Encounter (Signed)
Called the patient and made her aware of the sleep study results. The patient has not been using anything at night to sleep and states that she has been sleeping really well. I informed her that her sleep study shows that she should really continue with using the ASV therapy that Dr Brett Fairy recommends because not only did she have apnea but her oxygen level would drop. The baseline is severe and the therapy is definitely needed. Dr Brett Fairy has changed the orders to fit what worked best during this sleep study. New orders will be sent to [facility] for adjustments and the patient already has a follow up apt made for Sept which I have encouraged her to keep so that we can assess how these findings look. Pt verbalized understanding.

## 2017-06-25 NOTE — Telephone Encounter (Signed)
error 

## 2017-07-22 NOTE — Progress Notes (Signed)
Cardiology Office Note   Date:  07/23/2017   ID:  Beth Haynes, DOB [birth year], MRN [id number]  PCP:  Velna Hatchet, MD  Cardiologist:   Mertie Moores, MD   Chief Complaint  Patient presents with  . Atrial Fibrillation  . Hypertension   Problem List 1.  Persistent atrial fib 2. Obstructive sleep apnea:   Having difficulty in getting a CPAP mask to fit.      Beth Haynes is a 72 y.o. female who presents for evaluation of possible atrial fib. She woke up with dizziness, room was spinning  Was seen by PA. Presented to the ER .  Head MRI was normal.  Symptoms sound like vertigo   She still has some occasional episodes of dizziness.   Has occasional episodes of palpitations ,  She thinks it may be related to panic attacks. No CP or dyspnea. Does not get any regular exercise.   Has a [place] in [place]   Dec. 6, 2016:  Beth Haynes is seen today for follow up of her atrial fib.  BP is elevated today .   Ate some salty foods this weekend .   July 20, 2015:  Beth Haynes is seen back today .  Has PAF and OSA. She does not have a good CPAP mask that fits.   Dec. 13, 2017:  Was not able to find a CPAP that worked .   Could never get used to it  BP is elevated.  Has been eating more salt than usual - she and her husband eat popcorn every night.   February 26, 2017: Was hospitalized with CHF,  And Afib .  last week. Was not taking her meds and developed CHF  Is on Eliquis but is changing to Xarelto 20 mg a day   Mar 05, 2017:  Beth Haynes is doing better.  Still in AF,  HR is better controlled.  Was not taking the eliquis as directed initialy but has   July 23, 2017:  Beth Haynes is seen back today for history of paroxysmal atrial fibrillation.  She was seen by Richardson Dopp in March and was set up for cardioversion.  She was successfully cardioverted at that time but when she showed up for follow-up visit she was already back in atrial fibrillation.  We have adopted a rate  control and anticoagulation strategy at that time.  She continues on  Xarelto.  Getting some exercise ,  [facility]    [facility] History:  Diagnosis Date  . Atrial fibrillation ([place])   . IBS (irritable bowel syndrome)   . Obesity (BMI 30-39.9) 07/11/2015  . OSA (obstructive sleep apnea) 01/17/2015   Severe with AHI 35.8/hr  . Panic attacks   . Urinary incontinence, nocturnal enuresis     Past Surgical History:  Procedure Laterality Date  . CARDIOVERSION N/A 04/02/2017   Procedure: CARDIOVERSION;  Surgeon: Skeet Latch, MD;  Location: [facility];  Service: Cardiovascular;  Laterality: N/A;  . TONSILLECTOMY       Current Outpatient Medications  Medication Sig Dispense Refill  . diltiazem (CARDIZEM CD) 360 MG 24 hr capsule Take 1 capsule (360 mg total) by mouth daily. 90 capsule 3  . losartan (COZAAR) 50 MG tablet Take 50 mg by mouth daily. Reported on 07/20/2015  5  . metoprolol succinate (TOPROL XL) 25 MG 24 hr tablet Take 1 tablet (25 mg total) by mouth daily. 90 tablet 0  .  rivaroxaban (XARELTO) 20 MG TABS tablet Take 1 tablet (20 mg total) by mouth daily with supper. 30 tablet 5  . sertraline (ZOLOFT) 100 MG tablet Take 200 mg by mouth at bedtime.      No current facility-administered medications for this visit.     Allergies:   Sulfa antibiotics and Epinephrine    Social History:  The patient  reports that she has never smoked. She has never used smokeless tobacco. She reports that she does not drink alcohol or use drugs.   Family History:  The patient's family history includes Breast cancer in her maternal aunt; Heart attack in her father and paternal grandfather; Heart failure in her mother; Hypertension in her maternal grandmother.    ROS: Noted in current history, otherwise negative.  Physical Exam: Blood pressure 130/76, pulse 84, height 5\' 3"  (1.6 m), weight 180 lb 12.8 oz (82 kg), SpO2 96 %.  GEN:   Elderly female, NAD  HEENT:  Normal NECK: No JVD; No carotid bruits LYMPHATICS: No lymphadenopathy CARDIAC: irreg.   Irreg.  RESPIRATORY:  Clear to auscultation without rales, wheezing or rhonchi  ABDOMEN: Soft, non-tender, non-distended MUSCULOSKELETAL:  No edema; No deformity  SKIN: Warm and dry NEUROLOGIC:  Alert and oriented x 3  EKG:    Recent Labs: 02/20/2017: B Natriuretic Peptide 387.9; Magnesium 2.0 02/21/2017: Hemoglobin 13.4; Platelets 173; TSH 2.112 03/26/2017: BUN 10; Creatinine, Ser 0.73; Potassium 4.2; Sodium 142    Lipid Panel    Component Value Date/Time   CHOL 127 02/21/2017 0836   TRIG 66 02/21/2017 0836   HDL 37 (L) 02/21/2017 0836   CHOLHDL 3.4 02/21/2017 0836   VLDL 13 02/21/2017 0836   LDLCALC 77 02/21/2017 0836      Wt Readings from Last 3 Encounters:  07/23/17 180 lb 12.8 oz (82 kg)  05/06/17 183 lb (83 kg)  04/14/17 179 lb 1.9 oz (81.2 kg)      Other studies Reviewed: Additional studies/ records that were reviewed today include: . Review of the above records demonstrates:    ASSESSMENT AND PLAN:  1.  Vertigo:   Better balance after going to the [facility]   2.    atrial fib:  . Has persistent AF now.    She had a successful cardioversion several months ago but had gone back into atrial for ablation by the time she saw [place] back in the office.  We will continue with anticoagulation and rate control  We discussed options of rhythm control versus rate control.  At this point her symptoms are such that she would just like to continue with anticoagulation and rate control.   3. Obstructive sleep apnea:    Is on BIPAP .  Is working with Dr. Brett Fairy for her OSA   4. Essential HTN:     - BP is well controlled     Current medicines are reviewed at length with the patient today.  The patient does not have concerns regarding medicines.  The following changes have been made:  no change  Labs/ tests ordered today include:  No orders of the defined types were placed in this  encounter.    Disposition:   FU with me in  6 months .      Mertie Moores, MD  07/23/2017 10:04 AM    [address], [place], [place]  [address] Phone: [phone]; Fax: [phone]   [address] [place], [place]  [address] [phone]   Fax [phone]

## 2017-07-23 ENCOUNTER — Encounter: Payer: Self-pay | Admitting: Cardiovascular Disease

## 2017-07-23 ENCOUNTER — Ambulatory Visit: Payer: Medicare Other | Admitting: Cardiovascular Disease

## 2017-07-23 VITALS — BP 130/76 | HR 84 | Ht 63.0 in | Wt 180.8 lb

## 2017-07-23 DIAGNOSIS — I4819 Other persistent atrial fibrillation: Secondary | ICD-10-CM

## 2017-07-23 DIAGNOSIS — I5032 Chronic diastolic (congestive) heart failure: Secondary | ICD-10-CM

## 2017-07-23 DIAGNOSIS — G4733 Obstructive sleep apnea (adult) (pediatric): Secondary | ICD-10-CM

## 2017-07-23 DIAGNOSIS — I10 Essential (primary) hypertension: Secondary | ICD-10-CM | POA: Diagnosis not present

## 2017-07-23 DIAGNOSIS — I481 Persistent atrial fibrillation: Secondary | ICD-10-CM

## 2017-07-23 NOTE — Patient Instructions (Signed)
Medication Instructions:  Your physician recommends that you continue on your current medications as directed. Please refer to the Current Medication list given to you today.   Labwork: None Ordered   Testing/Procedures: None Ordered   Follow-Up: Your physician wants you to follow-up in: 6 months with Scott Weaver, PA or another member of Dr. Nahser's team.  You will receive a reminder letter in the mail two months in advance. If you don't receive a letter, please call our office to schedule the follow-up appointment.   If you need a refill on your cardiac medications before your next appointment, please call your pharmacy.   Thank you for choosing [facility]! Michelle Swinyer, RN [phone]    

## 2017-08-07 ENCOUNTER — Telehealth: Payer: Self-pay | Admitting: Cardiovascular Disease

## 2017-08-07 NOTE — Telephone Encounter (Signed)
Per pt call she has a question about her medications and wither she can change them to or take less of.

## 2017-08-08 NOTE — Telephone Encounter (Signed)
Left message for patient to call back  

## 2017-08-11 NOTE — Telephone Encounter (Signed)
Spoke with patient who is requesting assistance for cost of Xarelto. She states she had to pay $1000 for a different medication that she ultimately did not need. I advised that I will forward message to our Patient Care Advocates for assistance with paperwork for patient assistance and/or tier exception. She states she has 6 weeks worth of Xarelto at home. She verbalized understanding and agreement with plan and thanked me for the call.

## 2017-08-11 NOTE — Telephone Encounter (Signed)
The pt states that she has been advised by both [place] and [employer] that she is in the donut hole.  Her Xarelto is costing her $45 for a 30 day supply and she states that she cannot afford that.  I offered to mail her a [employer] pt asst application but she states that she will print one off of the computer, fill it out and return it to me along with her 2018 proof of income and her 2019 out of pocket expense report from her pharmacy.

## 2017-08-11 NOTE — Telephone Encounter (Signed)
Pt returning call to nurse. Please call pt °

## 2017-08-11 NOTE — Telephone Encounter (Signed)
Left message for patient to call back  

## 2017-09-05 NOTE — Telephone Encounter (Signed)
We received the provider part of a [place] pt asst application from [employer] for the pts Xarelto. I have never heard of this company and since a lot of the information concerning the office address and our tax ID # is incorrect I called the phone number [phone]) listed on the cover letter from them.  I was on hold for 10 mins so I hung up and then called the pt.  The pt states that she found this company through the Internet when she was looking for a [employer] pt asst application to print out from her computer at home. She states that they charged her a one time only charge of $60 to fill out her pt asst application.  I have advised her that if she can get her money back from that company to do so and I can help her with her pt asst application.  She states that she will call them and that she will call me back to let me know if she was able to get her money back. If she is unable to get her money back then she wants them to finish the pt asst application and if she can get her money back she wants me to help her with the application.  I have placed a [employer] pt asst application in our out going mail addressed to the pt. Just in case she needs it.

## 2017-09-08 ENCOUNTER — Telehealth: Payer: Self-pay | Admitting: Cardiovascular Disease

## 2017-09-08 NOTE — Telephone Encounter (Signed)
New message  Pt states she cancelled w/medical co and she can proceed with what she is going to do. The pt will be back on Wed.

## 2017-09-24 NOTE — Telephone Encounter (Signed)
**Note De-Identified  Obfuscation** The pt returned her Wynetta Emery and [employer] Pt Asst application and all documents required and I have faxed all to [employer] pt asst program.

## 2017-10-16 ENCOUNTER — Other Ambulatory Visit: Payer: Self-pay | Admitting: Internal Medicine

## 2017-10-16 DIAGNOSIS — W19XXXA Unspecified fall, initial encounter: Secondary | ICD-10-CM

## 2017-10-16 DIAGNOSIS — N39498 Other specified urinary incontinence: Secondary | ICD-10-CM

## 2017-10-16 DIAGNOSIS — R5383 Other fatigue: Secondary | ICD-10-CM

## 2017-10-16 DIAGNOSIS — R2689 Other abnormalities of gait and mobility: Secondary | ICD-10-CM

## 2017-10-16 DIAGNOSIS — R413 Other amnesia: Secondary | ICD-10-CM

## 2017-10-22 ENCOUNTER — Telehealth: Payer: Self-pay

## 2017-10-22 NOTE — Telephone Encounter (Signed)
Dr. Acie Fredrickson advises that patient may switch to Coumadin if needed as long as she is willing to come in for INR checks. He agrees that if she is able to buy some Xarelto, our office will try to help by providing samples. He is in agreement with plan that is best for patient.

## 2017-10-22 NOTE — Telephone Encounter (Signed)
**Note De-Identified  Obfuscation** The pt is advised and she is aware that someone from our Coumadin Clinic will contact her to discuss her switching from Xarelto to Coumadin until the new year and she comes out of the donut hole.  I will forward this message to the Coumadin clinic so they can contact the pt.

## 2017-10-22 NOTE — Telephone Encounter (Signed)
**Note De-Identified  Obfuscation** The pts phone is busy. Will continue to call.

## 2017-10-22 NOTE — Telephone Encounter (Signed)
**Note De-Identified  Obfuscation** The pt called the office today to let me know that she was denied pt asst through [place] because her income exceeds the limits. She wants to know what to do as she is almost out of Xarelto and cannot afford to pay for it.  I have advised her that I will leave her a bottle of Xarelto samples in the front office for her to pick up and that I will get this message to Dr Acie Fredrickson and his nurse for advisement.

## 2017-10-23 ENCOUNTER — Other Ambulatory Visit: Payer: Self-pay | Admitting: Physician Assistant

## 2017-10-23 NOTE — Telephone Encounter (Signed)
Telephoned pt and discussed switching her to Coumadin from Xarelto. Discussed with her the need to come into office for INR checks and management, dietary restrictions and drug interactions. She states she wants to discuss with her spouse and call back. She has approx 2 weeks worth of Xarelto left and will call back later with her decision.

## 2017-10-24 ENCOUNTER — Encounter (HOSPITAL_COMMUNITY): Payer: Self-pay | Admitting: Emergency Medicine

## 2017-10-24 ENCOUNTER — Ambulatory Visit
Admission: RE | Admit: 2017-10-24 | Discharge: 2017-10-24 | Disposition: A | Discharge status: Home / Self Care | Payer: Medicare Other | Source: Ambulatory Visit | Attending: Internal Medicine | Admitting: Internal Medicine

## 2017-10-24 ENCOUNTER — Other Ambulatory Visit: Payer: Self-pay

## 2017-10-24 ENCOUNTER — Emergency Department (HOSPITAL_COMMUNITY)
Admission: EM | Admit: 2017-10-24 | Discharge: 2017-10-24 | Disposition: A | Discharge status: Home / Self Care | Payer: Medicare Other | Attending: Emergency Medicine | Admitting: Emergency Medicine

## 2017-10-24 ENCOUNTER — Emergency Department (HOSPITAL_COMMUNITY): Payer: Medicare Other

## 2017-10-24 DIAGNOSIS — I610 Nontraumatic intracerebral hemorrhage in hemisphere, subcortical: Secondary | ICD-10-CM | POA: Diagnosis not present

## 2017-10-24 DIAGNOSIS — N39498 Other specified urinary incontinence: Secondary | ICD-10-CM

## 2017-10-24 DIAGNOSIS — R2689 Other abnormalities of gait and mobility: Secondary | ICD-10-CM

## 2017-10-24 DIAGNOSIS — W19XXXA Unspecified fall, initial encounter: Secondary | ICD-10-CM

## 2017-10-24 DIAGNOSIS — I639 Cerebral infarction, unspecified: Secondary | ICD-10-CM | POA: Insufficient documentation

## 2017-10-24 DIAGNOSIS — I480 Paroxysmal atrial fibrillation: Secondary | ICD-10-CM | POA: Diagnosis not present

## 2017-10-24 DIAGNOSIS — I5032 Chronic diastolic (congestive) heart failure: Secondary | ICD-10-CM | POA: Diagnosis not present

## 2017-10-24 DIAGNOSIS — I110 Hypertensive heart disease with heart failure: Secondary | ICD-10-CM | POA: Insufficient documentation

## 2017-10-24 DIAGNOSIS — I10 Essential (primary) hypertension: Secondary | ICD-10-CM

## 2017-10-24 DIAGNOSIS — R5383 Other fatigue: Secondary | ICD-10-CM

## 2017-10-24 DIAGNOSIS — I619 Nontraumatic intracerebral hemorrhage, unspecified: Secondary | ICD-10-CM

## 2017-10-24 DIAGNOSIS — G4733 Obstructive sleep apnea (adult) (pediatric): Secondary | ICD-10-CM

## 2017-10-24 DIAGNOSIS — R413 Other amnesia: Secondary | ICD-10-CM

## 2017-10-24 DIAGNOSIS — R262 Difficulty in walking, not elsewhere classified: Secondary | ICD-10-CM | POA: Diagnosis present

## 2017-10-24 LAB — CBC
HCT: 49.3 % — ABNORMAL HIGH (ref 36.0–46.0)
Hemoglobin: 15.5 g/dL — ABNORMAL HIGH (ref 12.0–15.0)
MCH: 29.1 pg (ref 26.0–34.0)
MCHC: 31.4 g/dL (ref 30.0–36.0)
MCV: 92.7 fL (ref 78.0–100.0)
Platelets: 231 10*3/uL (ref 150–400)
RBC: 5.32 MIL/uL — ABNORMAL HIGH (ref 3.87–5.11)
RDW: 14.7 % (ref 11.5–15.5)
WBC: 4.9 10*3/uL (ref 4.0–10.5)

## 2017-10-24 LAB — COMPREHENSIVE METABOLIC PANEL
ALBUMIN: 3.6 g/dL (ref 3.5–5.0)
ALK PHOS: 95 U/L (ref 38–126)
ALT: 18 U/L (ref 0–44)
AST: 19 U/L (ref 15–41)
Anion gap: 9 (ref 5–15)
BUN: 13 mg/dL (ref 8–23)
CHLORIDE: 106 mmol/L (ref 98–111)
CO2: 26 mmol/L (ref 22–32)
Calcium: 9.7 mg/dL (ref 8.9–10.3)
Creatinine, Ser: 0.84 mg/dL (ref 0.44–1.00)
GFR calc non Af Amer: >60 mL/min (ref >60)
GLUCOSE: 100 mg/dL — AB (ref 70–99)
Potassium: 4.3 mmol/L (ref 3.5–5.1)
SODIUM: 141 mmol/L (ref 135–145)
Total Bilirubin: 0.9 mg/dL (ref 0.3–1.2)
Total Protein: 6.1 g/dL — ABNORMAL LOW (ref 6.5–8.1)

## 2017-10-24 LAB — I-STAT CHEM 8, ED
BUN: 15 mg/dL (ref 8–23)
CALCIUM ION: 1.24 mmol/L (ref 1.15–1.40)
CHLORIDE: 107 mmol/L (ref 98–111)
Creatinine, Ser: 0.8 mg/dL (ref 0.44–1.00)
GLUCOSE: 95 mg/dL (ref 70–99)
HEMATOCRIT: 47 % — AB (ref 36.0–46.0)
HEMOGLOBIN: 16 g/dL — AB (ref 12.0–15.0)
POTASSIUM: 4.2 mmol/L (ref 3.5–5.1)
SODIUM: 141 mmol/L (ref 135–145)
TCO2: 29 mmol/L (ref 22–32)

## 2017-10-24 LAB — DIFFERENTIAL
Abs Immature Granulocytes: 0 10*3/uL (ref 0.0–0.1)
BASOS ABS: 0.1 10*3/uL (ref 0.0–0.1)
BASOS PCT: 1 %
EOS PCT: 2 %
Eosinophils Absolute: 0.1 10*3/uL (ref 0.0–0.7)
IMMATURE GRANULOCYTES: 0 %
Lymphocytes Relative: 22 %
Lymphs Abs: 1.1 10*3/uL (ref 0.7–4.0)
MONO ABS: 0.5 10*3/uL (ref 0.1–1.0)
Monocytes Relative: 9 %
NEUTROS ABS: 3.2 10*3/uL (ref 1.7–7.7)
NEUTROS PCT: 66 %

## 2017-10-24 LAB — PROTIME-INR
INR: 1.71
Prothrombin Time: 19.9 seconds — ABNORMAL HIGH (ref 11.4–15.2)

## 2017-10-24 LAB — I-STAT TROPONIN, ED: Troponin i, poc: 0 ng/mL (ref 0.00–0.08)

## 2017-10-24 LAB — APTT: APTT: 38 s — AB (ref 24–36)

## 2017-10-24 MED ORDER — GADOBENATE DIMEGLUMINE 529 MG/ML IV SOLN
17.0000 mL | Freq: Once | INTRAVENOUS | Status: AC | PRN
  Administered 2017-10-24: 17 mL via INTRAVENOUS

## 2017-10-24 NOTE — ED Notes (Signed)
Patient verbalizes understanding of discharge instructions. Opportunity for questioning and answers were provided. Armband removed by staff, pt discharged from ED.  

## 2017-10-24 NOTE — Discharge Instructions (Addendum)
Please follow up with your cardiologist and your neurologist. Call them today or tomorrow morning to schedule a follow up appointment. Continue taking your Xarelto.  Return to ER for new or worsening symptoms, any additional concerns.

## 2017-10-24 NOTE — ED Provider Notes (Addendum)
[facility] EMERGENCY DEPARTMENT Provider Note   CSN: [id number] Arrival date & time: 10/24/17  1111     History   Chief Complaint Chief Complaint  Patient presents with  . Sent by Doctor    HPI Beth Haynes is a 72 y.o. female.  The history is provided by the patient and medical records. No language interpreter was used.   Beth Haynes is a 72 y.o. female  with a PMH of paroxysmal A. fib on Xarelto, sleep apnea on CPAP who presents to the Emergency Department from primary care doctor recommendations after having an MRI today which was abnormal.  Patient states that she saw her primary care doctor because she was having difficulty ambulating over the last several months.  She had a fall 2 weeks ago where she struck the left side of her head on cement.  She noted a large area of bruising around her left eye.  Husband at bedside states that since her fall, she has been having more difficulty ambulating and moving much slower.  He has not noticed any other changes.  She denies any trouble with word finding and has been denies any slurred speech.  She does not note any weakness to her upper or lower extremities.  No recent illness, fever or chills.  As above, she is on Xarelto which she last took yesterday morning.  She did go to the eye doctor earlier today who told her that nothing was wrong with her eyes from the fall.   Past Medical History:  Diagnosis Date  . Atrial fibrillation ([place])   . IBS (irritable bowel syndrome)   . Obesity (BMI 30-39.9) 07/11/2015  . OSA (obstructive sleep apnea) 01/17/2015   Severe with AHI 35.8/hr  . Panic attacks   . Urinary incontinence, nocturnal enuresis     Patient Active Problem List   Diagnosis Date Noted  . 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 Paroxysmal atrial fibrillation ([place]) 05/06/2017  . Intolerance of continuous positive airway pressure (CPAP) ventilation 05/06/2017  . 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  Persistent atrial fibrillation ([place]) 02/22/2017  . Depression 02/20/2017  . Chronic diastolic CHF (congestive heart failure) ([place]) 02/20/2017  . Obstructive sleep apnea hypopnea, severe 06/12/2016  . Obesity (BMI 30-39.9) 07/11/2015  . OSA (obstructive sleep apnea) 01/17/2015  . HTN (hypertension) 01/17/2015    Past Surgical History:  Procedure Laterality Date  . CARDIOVERSION N/A 04/02/2017   Procedure: CARDIOVERSION;  Surgeon: Skeet Latch, MD;  Location: [facility];  Service: Cardiovascular;  Laterality: N/A;  . TONSILLECTOMY       OB History   None      Home Medications    Prior to Admission medications   Medication Sig Start Date End Date Taking? Authorizing Provider  diltiazem (CARDIZEM CD) 360 MG 24 hr capsule Take 1 capsule (360 mg total) by mouth daily. 03/21/17   Nahser, Wonda Cheng, MD  losartan (COZAAR) 50 MG tablet Take 50 mg by mouth daily. Reported on 07/20/2015 07/03/15   [provider]  metoprolol succinate (TOPROL-XL) 25 MG 24 hr tablet TAKE 1 TABLET BY MOUTH  DAILY 10/23/17   Nahser, Wonda Cheng, MD  rivaroxaban (XARELTO) 20 MG TABS tablet Take 1 tablet (20 mg total) by mouth daily with supper. 04/01/17   Nahser, Wonda Cheng, MD  sertraline (ZOLOFT) 100 MG tablet Take 200 mg by mouth at bedtime.     [provider]    Family History Family History  Problem Relation  Age of Onset  . Heart failure Mother   . Heart attack Father   . Hypertension Maternal Grandmother   . Heart attack Paternal Grandfather   . Breast cancer Maternal Aunt     Social History Social History   Tobacco Use  . Smoking status: Never Smoker  . Smokeless tobacco: Never Used  Substance Use Topics  . Alcohol use: No  . Drug use: No     Allergies   Sulfa antibiotics and Epinephrine   Review of Systems Review of Systems  HENT: Positive for facial swelling.   Neurological: Positive for dizziness (Balance issues).  All other systems reviewed and are  negative.    Physical Exam Updated Vital Signs BP 138/85   Pulse 78   Temp 97.8 F (36.6 C) (Oral)   Resp 11   Ht 5\' 3"  (1.6 m)   Wt 74.8 kg   SpO2 95%   BMI 29.23 kg/m   Physical Exam  Constitutional: She is oriented to person, place, and time. She appears well-developed and well-nourished. No distress.  HENT:  Head: Normocephalic.  Hematoma above left eye which is tender to palpation.  No hemotympanum.  Eyes: Pupils are equal, round, and reactive to light. Conjunctivae and EOM are normal.  Cardiovascular: Normal rate, regular rhythm and normal heart sounds.  No murmur heard. Pulmonary/Chest: Effort normal and breath sounds normal. No respiratory distress.  Tenderness to palpation to left chest wall.   Abdominal: Soft. She exhibits no distension. There is no tenderness.  Musculoskeletal: Normal range of motion.  Neurological: She is alert and oriented to person, place, and time.  Alert, oriented, thought content appropriate, able to give a coherent history. Speech is clear and goal oriented, able to follow commands.  Cranial Nerves:  II:  Peripheral visual fields grossly normal, pupils equal, round, reactive to light III, IV, VI: EOM intact bilaterally, ptosis not present V,VII: smile symmetric, eyes kept closed tightly against resistance, facial light touch sensation equal VIII: hearing grossly normal IX, X: symmetric soft palate movement, uvula elevates symmetrically  XI: bilateral shoulder shrug symmetric and strong XII: midline tongue extension 5/5 muscle strength in upper and lower extremities bilaterally including strong and equal grip strength and dorsiflexion/plantar flexion Sensory to light touch normal in all four extremities.  Normal finger-to-nose and rapid alternating movements; normal gait and balance. No pronator drift.  Skin: Skin is warm and dry.  Nursing note and vitals reviewed.    ED Treatments / Results  Labs (all labs ordered are listed, but  only abnormal results are displayed) Labs Reviewed  PROTIME-INR - Abnormal; Notable for the following components:      Result Value   Prothrombin Time 19.9 (*)    All other components within normal limits  APTT - Abnormal; Notable for the following components:   aPTT 38 (*)    All other components within normal limits  CBC - Abnormal; Notable for the following components:   RBC 5.32 (*)    Hemoglobin 15.5 (*)    HCT 49.3 (*)    All other components within normal limits  COMPREHENSIVE METABOLIC PANEL - Abnormal; Notable for the following components:   Glucose, Bld 100 (*)    Total Protein 6.1 (*)    All other components within normal limits  I-STAT CHEM 8, ED - Abnormal; Notable for the following components:   Hemoglobin 16.0 (*)    HCT 47.0 (*)    All other components within normal limits  DIFFERENTIAL  I-STAT TROPONIN,  ED  CBG MONITORING, ED    EKG None  Radiology Dg Ribs Unilateral W/chest Left  Result Date: 10/24/2017 CLINICAL DATA:  Pain post fall 2 weeks ago EXAM: LEFT RIBS AND CHEST - 3+ VIEW COMPARISON:  02/20/2017 FINDINGS: No fracture or other bone lesions are seen involving the ribs. There is no evidence of pneumothorax or pleural effusion. Linear scarring or subsegmental atelectasis at the left lung base, improved. Lungs otherwise clear. Heart size and mediastinal contours are within normal limits. Aortic Atherosclerosis (ICD10-170.0). IMPRESSION: Negative. Electronically Signed   By: Lucrezia Europe M.D.   On: 10/24/2017 13:39   Ct Head Wo Contrast  Result Date: 10/24/2017 CLINICAL DATA:  Recent fall EXAM: CT HEAD WITHOUT CONTRAST TECHNIQUE: Contiguous axial images were obtained from the base of the skull through the vertex without intravenous contrast. COMPARISON:  Brain MRI October 24, 2017 performed earlier in the day. Prior head CT February 21, 2017 FINDINGS: Brain: There is mild diffuse atrophy. There is an acute appearing infarct in the right posterior lentiform  nucleus. This infarct extends into the inferior right centrum semiovale. Hemorrhage within this apparent infarct is better appreciated on recent MR. There is evidence of a prior infarct in the right thalamus. No other hemorrhage evident. There is no mass or extra-axial fluid collection. No midline shift. There is small vessel disease throughout the centra semiovale bilaterally. Vascular: No hyperdense vessels are appreciable. There is calcification in each distal vertebral artery and in the carotid siphon regions. Skull: There is frontal hyperostosis. The bony calvarium appears intact without evident fracture. There is a sizable inferior left orbital and superior preseptal left periorbital soft tissue hematoma. Sinuses/Orbits: There is mucosal thickening in the right maxillary antrum. There is mucosal thickening in several ethmoid air cells. No intraorbital lesions are evident. Other: There is patchy mastoid air cell disease bilaterally. IMPRESSION: 1. Acute appearing right posterior lentiform nucleus hemorrhage with extension in the inferior right centrum semiovale. Hemorrhage within this apparent acute infarct is much better seen on MR. 2.  Prior lacunar type infarct in the right thalamus. 3. Patchy small vessel disease throughout the centra semiovale bilaterally. 4. Left preseptal superior periorbital and left frontal hematoma without underlying fracture. No intraorbital lesions are evident. 5.  Foci of arterial vascular calcification noted. 6.  Areas of paranasal sinus and mastoid disease. Comment: Report of intracranial hemorrhage was called earlier in the day at the time of the brain MR examination. Electronically Signed   By: Lowella Grip III M.D.   On: 10/24/2017 13:16   Mr Jeri Cos NI Contrast  Result Date: 10/24/2017 CLINICAL DATA:  Other urinary incontinence. Balance problems. Accidental fall, initial encounter. Other fatigue. Memory impairment. Question normal pressure hydrocephalus. EXAM: MRI  HEAD WITHOUT AND WITH CONTRAST TECHNIQUE: Multiplanar, multiecho pulse sequences of the brain and surrounding structures were obtained without and with intravenous contrast. CONTRAST:  45mL MULTIHANCE GADOBENATE DIMEGLUMINE 529 MG/ML IV SOLN COMPARISON:  CT head without contrast 02/21/2017. FINDINGS: Brain: Right basal ganglia hemorrhage extends from the globus pallidus and internal capsule superiorly to the corona radiata. The hemorrhage measures 2.4 x 1.4 x 1.0 cm. Given the degree of atrophy, there is no significant mass effect from this hemorrhage. Susceptibility weighted images demonstrate multiple punctate foci of susceptibility throughout the basal ganglia, subcortical regions, and brainstem. This is most consistent with amyloid angiopathy. No other acute hemorrhage is present. Moderate atrophy is present. There is moderate confluent periventricular and diffuse subcortical white matter disease bilaterally. Dilated perivascular spaces are  noted within the basal ganglia. Remote lacunar infarcts are present separate and without hemorrhage in the basal ganglia. There are remote nonhemorrhagic lacunar infarcts in the right thalamus. White matter changes extend into the brainstem. No enhancing lesions are present. There is no underlying mass at the site of hemorrhage. Vascular: Flow is present in the major intracranial arteries. Ventricles are proportionate to the degree of atrophy. No significant extra-axial fluid collection is present. Skull and upper cervical spine: The skull base is within normal limits. There is focal thickening of the inner cortex of the squamous left temporal bone on image 12 of series 7. There is no associated enhancement. Hyperostosis frontalis internus is also noted. A left supraorbital soft tissue hematoma is present, measuring up to 2.5 cm. Sinuses/Orbits: The left globe and orbit is otherwise within normal limits. The right globe and orbit is normal. Mild mucosal thickening is present  in the maxillary sinuses and ethmoid air cells bilaterally. Bilateral mastoid effusions are present. No obstructing nasopharyngeal lesion is evident. IMPRESSION: 1. 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 2. Moderate generalized atrophy and white matter disease. Given the degree of atrophy, there is no significant mass effect from the hemorrhage. The atrophy and white matter likely reflects the sequelae of chronic microvascular ischemia. 3. Multiple foci of susceptibility throughout the brain suggesting amyloid angiopathy. The right basal ganglia hemorrhage is likely a complication of the underlying angiopathy. 4. 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 Remote lacunar infarcts are also present in the right thalamus. 5. Left supraorbital hematoma without underlying fracture or globe injury. These results were called by telephone at the time of interpretation on 10/24/2017 at 10:45 Am to Dr. Velna Hatchet , who verbally acknowledged these results. Bowel and conversation with Dr. Ardeth Perfect, the patient and her husband were notified of the hemorrhage. They were instructed to report to the [facility] emergency department for further evaluation and workup. This was communicated by Dr. Ethlyn Gallery. I informed the [facility] emergency department triage nurse of this plan and the findings. Electronically Signed   By: San Morelle M.D.   On: 10/24/2017 11:31    Procedures Procedures (including critical care time)  Medications Ordered in ED Medications - No data to display   Initial Impression / Assessment and Plan / ED Course  I have reviewed the triage vital signs and the nursing notes.  Pertinent labs & imaging results that were available during my care of the patient were reviewed by me and considered in my medical decision making (see chart for details).    Beth Haynes is a 72 y.o. female who presents to ED from PCP for abnormal MRI  obtained earlier today.  She is initially went to her primary doctor for concerns of her difficulty with ambulation has been ongoing for several months, but worsened significantly after a fall 2 weeks ago.  She has had no other trauma since 2 weeks ago. She does have tenderness to left chest wall. X-ray obtained with no acute findings.  MRI obtained earlier today was reviewed showing right basal ganglia hemorrhage. Neurology consulted who recommends CT. CT head shows acute appearing right-sided hemorrhage. Neuro will admit to neuro ICU.   Addendum: After neurology PA spoke with attending, Dr.Xu, plan of care has changed.  I spoke with Dr. Erlinda Hong and he recommends discharge. Stroke appears to have been 7-10 days ago. He does not believe hospital admission will change plan of care.  She has no neuro deficits.  He discussed risks and benefits of continuing Xarelto and they agreed upon going ahead and continuing this.  Plan to follow-up with both neurology and cardiology as outpatient, monitor blood pressure at home and do her best to avoid falls.  Reasons to return to the emergency department were discussed and all questions answered.  Patient discussed with Dr. Zenia Resides who agrees with treatment plan.    Final Clinical Impressions(s) / ED Diagnoses   Final diagnoses:  Cerebrovascular accident (CVA), unspecified mechanism [facility])    ED Discharge Orders    None       Lissete Maestas, Ozella Almond, PA-C 10/24/17 1449    Eliazar Olivar, Ozella Almond, PA-C 10/24/17 1525    [name], MD 10/26/17 [phone]

## 2017-10-24 NOTE — Consult Note (Addendum)
Referring Physician: Dr Zenia Resides    Chief Complaint: [place]  HPI: Beth Haynes is a 72 y.o. female with a history of atrial fibrillation on Xarelto followed by Dr. Acie Fredrickson, obesity, obstructive sleep apnea, panic attacks, and irritable bowel syndrome. Apparently she has had several falls over the past couple of weeks.  Two weeks ago she fell and struck her head just over her left eye which resulted in a large hematoma. She had not been feeling well so she went to see her primary care physician,Dr [facility], today who sent her for an MRI at [facility].  The MRI showed a right basal ganglia hemorrhage and she was sent to the emergency department for further evaluation.  The patient appears to be very stable at this time. She is alert, able to converse appropriately and follows all commands. She denies pain. Dr Acie Fredrickson was notified as a courtesy per pt's request. No consult requested.    Past Medical History Past Medical History:  Diagnosis Date  . Atrial fibrillation ([place])   . IBS (irritable bowel syndrome)   . Obesity (BMI 30-39.9) 07/11/2015  . OSA (obstructive sleep apnea) 01/17/2015   Severe with AHI 35.8/hr  . Panic attacks   . Urinary incontinence, nocturnal enuresis     Surgical History Past Surgical History:  Procedure Laterality Date  . CARDIOVERSION N/A 04/02/2017   Procedure: CARDIOVERSION;  Surgeon: Skeet Latch, MD;  Location: [facility] ENDOSCOPY;  Service: Cardiovascular;  Laterality: N/A;  . TONSILLECTOMY      Family History  Family History  Problem Relation Age of Onset  . Heart failure Mother   . Heart attack Father   . Hypertension Maternal Grandmother   . Heart attack Paternal Grandfather   . Breast cancer Maternal Aunt     Social History:   reports that she has never smoked. She has never used smokeless tobacco. She reports that she does not drink alcohol or use drugs.  Allergies:  Allergies  Allergen Reactions  . Sulfa Antibiotics     Childhood allergy   .  Epinephrine     Panic attacks     Home Medications:  diltiazem (CARDIZEM CD) 360 MG 24 hr capsule Take 1 capsule (360 mg total) by mouth daily. 03/21/17   Nahser, Wonda Cheng, MD  losartan (COZAAR) 50 MG tablet Take 50 mg by mouth daily. Reported on 07/20/2015 07/03/15   [provider]  metoprolol succinate (TOPROL-XL) 25 MG 24 hr tablet TAKE 1 TABLET BY MOUTH  DAILY 10/23/17   Nahser, Wonda Cheng, MD  rivaroxaban (XARELTO) 20 MG TABS tablet Take 1 tablet (20 mg total) by mouth daily with supper. 04/01/17   Nahser, Wonda Cheng, MD  sertraline (ZOLOFT) 100 MG tablet Take 200 mg by mouth at bedtime.     [provider]       ROS:  History obtained from the patient   General ROS: negative for - chills, fatigue, fever, night sweats, weight gain or weight loss. Positive for recent falls. Psychological ROS: negative for - behavioral disorder, hallucinations, memory difficulties, mood swings or suicidal ideation. Hx of panic attacks. Ophthalmic ROS: negative for - blurry vision, double vision, eye pain or loss of vision ENT ROS: negative for - epistaxis, nasal discharge, oral lesions, sore throat, tinnitus or vertigo Allergy and Immunology ROS: negative for - hives or itchy/watery eyes Hematological and Lymphatic ROS: negative for - bleeding problems, bruising or swollen lymph nodes Endocrine ROS: negative for - galactorrhea, hair pattern changes, polydipsia/polyuria or temperature  intolerance Respiratory ROS: negative for - cough, hemoptysis, shortness of breath or wheezing Cardiovascular ROS: negative for - chest pain, dyspnea on exertion, edema or irregular heartbeat Gastrointestinal ROS: negative for - abdominal pain, diarrhea, hematemesis, nausea/vomiting or stool incontinence Genito-Urinary ROS: negative for - dysuria, hematuria, incontinence or urinary frequency/urgency Musculoskeletal ROS: negative for - joint swelling or muscular weakness Neurological ROS: as noted in  HPI Dermatological ROS: negative for rash and skin lesion changes   Physical Examination: PENDING Vitals:   10/24/17 1200 10/24/17 1215 10/24/17 1245 10/24/17 1315  BP: 121/81 120/77 (!) 129/98 127/86  Pulse: 94 (!) 53 (!) 40 (!) 49  Resp: 16 14 16    Temp:      TempSrc:      SpO2: 96% 96% 97% 95%  Weight:      Height:        General -pleasant 72 year old female in no acute distress. Hematoma above her left eye. Heart -irregularly irregular heart rhythm. Lungs - Clear to auscultation  Abdomen - Soft - non tender Extremities - Distal pulses intact - no edema Skin - Warm and dry.    Neurologic Examination: Mental Status: Alert, oriented, thought content appropriate.  Speech fluent without evidence of aphasia. Able to follow 3 step commands without difficulty. Cranial Nerves: II: Discs not visualized; Visual fields grossly normal, pupils equal, round, reactive to light III,IV, VI: ptosis not present, extra-ocular motions intact bilaterally V,VII: smile symmetric, facial light touch sensation normal bilaterally VIII: hearing normal bilaterally IX,X: gag reflex present XI: bilateral shoulder shrug XII: midline tongue extension Motor: RUE - 5/5    LUE - 5/5   RLE - 5/5    LLE - 5/5 Tone and bulk:normal tone throughout; no atrophy noted Sensory: Light touch intact throughout, bilaterally Deep Tendon Reflexes: 2+ and symmetric throughout Plantars: Right: downgoing   Left: downgoing Cerebellar: normal finger-to-nose and normal heel-to-shin test Gait: deferred at this time.     LABORATORY STUDIES:  Basic Metabolic Panel: Recent Labs  Lab 10/24/17 1138 10/24/17 1153  NA 141 141  K 4.3 4.2  CL 106 107  CO2 26  --   GLUCOSE 100* 95  BUN 13 15  CREATININE 0.84 0.80  CALCIUM 9.7  --     Liver Function Tests: Recent Labs  Lab 10/24/17 1138  AST 19  ALT 18  ALKPHOS 95  BILITOT 0.9  PROT 6.1*  ALBUMIN 3.6   No results for input(s): LIPASE, AMYLASE in the  last 168 hours. No results for input(s): AMMONIA in the last 168 hours.  CBC: Recent Labs  Lab 10/24/17 1138 10/24/17 1153  WBC 4.9  --   NEUTROABS 3.2  --   HGB 15.5* 16.0*  HCT 49.3* 47.0*  MCV 92.7  --   PLT 231  --     Cardiac Enzymes: No results for input(s): CKTOTAL, CKMB, CKMBINDEX, TROPONINI in the last 168 hours.  BNP: Invalid input(s): POCBNP  CBG: No results for input(s): GLUCAP in the last 168 hours.  Microbiology:   Coagulation Studies: Recent Labs    10/24/17 1138  LABPROT 19.9*  INR 1.71    Urinalysis: No results for input(s): COLORURINE, LABSPEC, PHURINE, GLUCOSEU, HGBUR, BILIRUBINUR, KETONESUR, PROTEINUR, UROBILINOGEN, NITRITE, LEUKOCYTESUR in the last 168 hours.  Invalid input(s): APPERANCEUR  Lipid Panel:     Component Value Date/Time   CHOL 127 02/21/2017 0836   TRIG 66 02/21/2017 0836   HDL 37 (L) 02/21/2017 0836   CHOLHDL 3.4 02/21/2017 0836   VLDL 13  02/21/2017 0836   LDLCALC 77 02/21/2017 0836    HgbA1C:  Lab Results  Component Value Date   HGBA1C 5.2 02/21/2017    Urine Drug Screen:      Component Value Date/Time   LABOPIA NONE DETECTED 02/20/2017 2058   COCAINSCRNUR NONE DETECTED 02/20/2017 2058   LABBENZ NONE DETECTED 02/20/2017 2058   AMPHETMU NONE DETECTED 02/20/2017 2058   THCU NONE DETECTED 02/20/2017 2058   LABBARB NONE DETECTED 02/20/2017 2058     Alcohol Level:  No results for input(s): ETH in the last 168 hours.    IMAGING:  Mr Kizzie Fantasia Contrast  10/24/2017 IMPRESSION:  1. 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  2. Moderate generalized atrophy and white matter disease. Given the degree of atrophy, there is no significant mass effect from the hemorrhage. The atrophy and white matter likely reflects the sequelae of chronic microvascular ischemia.  3. Multiple foci of susceptibility throughout the brain suggesting amyloid angiopathy. The right basal ganglia hemorrhage is likely a  complication of the underlying angiopathy.  4. 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 Remote lacunar infarcts are also present in the right thalamus.  5. Left supraorbital hematoma without underlying fracture or globe injury.    CT Head WO Contrast 10/24/2017 IMPRESSION: 1. Acute appearing right posterior lentiform nucleus hemorrhage with extension in the inferior right centrum semiovale. Hemorrhage within this apparent acute infarct is much better seen on MR. 2.  Prior lacunar type infarct in the right thalamus. 3. Patchy small vessel disease throughout the centra semiovale bilaterally. 4. Left preseptal superior periorbital and left frontal hematoma  without underlying fracture. No intraorbital lesions are evident. 5.  Foci of arterial vascular calcification noted. 6.  Areas of paranasal sinus and mastoid disease.   DG Ribs Unilateral W/Chest Left 10/24/2017 IMPRESSION: Negative.    Assessment:  PHYLISHA DIX is a 72 y.o. female with a history of atrial fibrillation on Xarelto followed by Dr. Acie Fredrickson, obesity, obstructive sleep apnea, panic attacks, and irritable bowel syndrome. Apparently she has had several falls over the past couple of weeks.  Two weeks ago she fell and struck her head just over her left eye which resulted in a large hematoma. She had not been feeling well so she went to see her primary care physician,Dr [facility], today who sent her for an MRI at [facility].  The MRI showed a right basal ganglia hemorrhage and she was sent to the emergency department for further evaluation.  The patient appears to be very stable at this time. She is alert, able to converse appropriately and follows all commands. She denies pain. Dr Acie Fredrickson was notified as a courtesy per pt's request. No consult requested.   Stroke Risk Factors - Afib on Xarelto, previous infarcts, and OSA.   Mikey Bussing PA-C [facility] Pager [phone] 10/24/2017, 2:08 PM  ATTENDING NOTE: I reviewed above note and agree with the assessment and plan. Pt was seen and examined.   72 year old female with history of A. fib on Xarelto followed by Dr. Acie Fredrickson, hypertension, OSA presented to ED for ICH found on MRI.  Patient stated that she had a fall 2 weeks ago because her garden hose contracted and she had a mechanical fall.  She hit her head on the left with left orbital hematoma, but did not loss of consciousness, denies any numbness, weakness, nausea vomiting.  Since then she denies any fall at  home.  She admitted that she had a fall in the past, average every 2 months, but she stated that every time was mechanical fall including dog dragging, tripped over things.  Over this 2 weeks, patient still on Xarelto without missing doses.  She had PAF on Xarelto following with Dr. Acie Fredrickson.  She is on Cardizem, losartan and metoprolol for rate control and for BP management.  Currently in the ER, patient BP runs 125 - 140.  On exam, no focal neurological deficit. Well nourished, well developed, in no apparent distress. irregularly irregular heart rate and rhythm.  Mental Status -  Level of arousal and orientation to time, place, and person were intact. Language including expression, naming, repetition, comprehension was assessed and found intact. Attention span and concentration were normal. Recent and remote memory were intact. Fund of Knowledge was assessed and was intact.  Cranial Nerves II - XII - II - Visual field intact OU. III, IV, VI - Extraocular movements intact. V - Facial sensation intact bilaterally. VII - Facial movement intact bilaterally. VIII - Hearing & vestibular intact bilaterally. X - Palate elevates symmetrically. XI - Chin turning & shoulder shrug intact bilaterally. XII - Tongue protrusion intact.  Motor Strength - The patient's strength was normal in all extremities and pronator drift was absent.  Bulk was normal and  fasciculations were absent.   Motor Tone - Muscle tone was assessed at the neck and appendages and was normal.  Reflexes - The patient's reflexes were symmetrical in all extremities and she had no pathological reflexes.  Sensory - Light touch, temperature/pinprick were assessed and were symmetrical.    Coordination - The patient had normal movements in the hands and feet with no ataxia or dysmetria.  Tremor was absent.  Gait and Station - deferred.  Patient MRI showed right basal ganglia small ICH, with hyper intensity in both T1 and T2 sequences, indicating the hemorrhage at least 9 week old.  SWI sequences also showed multiple micro cerebral bleedings, suggesting cerebral amyloid angiopathy versus chronic hypertension.  CT stat also showed hypodensity at right basal ganglia, indicating ICH is not a super acute, likely to be 66 to 48 weeks old.  Patient neurologically stable, no need to be admitted at this time.  Okay to discharge from neuro standpoint.  However, had long discussion with patient at bedside regarding her anticoagulation use.  Apparently, Xarelto use increase her risk of ICH, however, not using Xarelto increase her risk of ischemic stroke.  Discussed with patient, she would like to continue the Xarelto as she fears ischemic stroke more than hemorrhagic stroke.  I think it is reasonable to continue Xarelto as it is, as she has been on Xarelto over the course of right BG hemorrhage and did not causing further bleeding, and her blood pressure in ER is well controlled.  I asked her to better control blood pressure at home, goal around 120.  And avoid fall at home, she can use walker or cane if she feel safer with them.  She will need to continue follow-up with Dr. Acie Fredrickson in cardiology and Dr. Brett Fairy in neurology.  Patient knows to come back or call 911 if any new neurological symptoms.  Neurology will sign off. Please call with questions. Thanks for the consult.  Rosalin Hawking, MD  PhD Stroke Neurology 10/24/2017 3:12 PM

## 2017-10-24 NOTE — ED Notes (Signed)
Pt returned from X-ray.  

## 2017-10-24 NOTE — ED Triage Notes (Signed)
Patient sent to ED by doctor for right-sided basal ganglia hemorrhage. Patient reports a mechanical fall 2 weeks ago that resulted in her hitting her head - hematoma noted above L eye, but patient states she was told this bleed is not r/t the fall. She denies LOC at that time. Does endorse intermittent dizziness since as well as memory issues and trouble ambulating. Patient currently A&O x 4.

## 2017-10-24 NOTE — ED Provider Notes (Signed)
Medical screening examination/treatment/procedure(s) were conducted as a shared visit with non-physician practitioner(s) and myself.  I personally evaluated the patient during the encounter.  None  72 year old female presents here after an outpatient study showed patient have intracranial bleed.  She has mild headache but has not had any mental status changes.  Neurological exam is stable at this time.  Patient seen by neurology who will admit to the neuro ICU.   [name], MD 10/24/17 1438

## 2017-10-28 ENCOUNTER — Encounter: Payer: Self-pay | Admitting: Neurology

## 2017-10-28 ENCOUNTER — Telehealth: Payer: Self-pay | Admitting: Neurology

## 2017-10-28 NOTE — Telephone Encounter (Signed)
Called the patient back. No answer. LVM informing the patient to call back and just leave the message with the phone personnel on what she would like to say and I informed her I would forward to Dr Brett Fairy. Patient has an apt coming up 0/48/88 and we can certainly just address everything at the upcoming apt. We also have access to the images and Dr Dohmeier can view the images.   If patient calls back just let her know that I got the message that she left earlier and if there is any other information she would like Dr Brett Fairy to know have her leave with you and ill make Dr Dohmeier aware.

## 2017-10-28 NOTE — Telephone Encounter (Signed)
Pt requesting a call stating her PCP has told her to discuss a few things with RN before upcoming appt. Such as a brain bleed they found at [facility] while doing an MRI Please advise

## 2017-10-31 NOTE — Telephone Encounter (Signed)
LMOM to see if pt made a decision on Coumadin versus Xarelto. She verbalized that she only had approx 2wks supply left on 10/23/17. Messaged left to call us so that we can ensure she don't run out of her anticoagulant.

## 2017-11-04 ENCOUNTER — Encounter: Payer: Self-pay | Admitting: Neurology

## 2017-11-04 ENCOUNTER — Ambulatory Visit: Payer: Medicare Other | Admitting: Neurology

## 2017-11-04 ENCOUNTER — Other Ambulatory Visit: Payer: Self-pay | Admitting: Neurology

## 2017-11-04 ENCOUNTER — Telehealth: Payer: Self-pay | Admitting: Neurology

## 2017-11-04 VITALS — BP 127/88 | HR 43 | Ht 63.0 in | Wt 188.0 lb

## 2017-11-04 DIAGNOSIS — G4719 Other hypersomnia: Secondary | ICD-10-CM

## 2017-11-04 DIAGNOSIS — R001 Bradycardia, unspecified: Secondary | ICD-10-CM

## 2017-11-04 DIAGNOSIS — Z7901 Long term (current) use of anticoagulants: Secondary | ICD-10-CM

## 2017-11-04 DIAGNOSIS — S062X9A Diffuse traumatic brain injury with loss of consciousness of unspecified duration, initial encounter: Secondary | ICD-10-CM

## 2017-11-04 DIAGNOSIS — I480 Paroxysmal atrial fibrillation: Secondary | ICD-10-CM

## 2017-11-04 DIAGNOSIS — R32 Unspecified urinary incontinence: Secondary | ICD-10-CM

## 2017-11-04 DIAGNOSIS — S062XAA Diffuse traumatic brain injury with loss of consciousness status unknown, initial encounter: Secondary | ICD-10-CM

## 2017-11-04 NOTE — Telephone Encounter (Signed)
[employer] medicare order sent to GI. No auth they will reach out to the pt to schedule.  °

## 2017-11-04 NOTE — Progress Notes (Signed)
SLEEP MEDICINE CLINIC   Provider:  Larey Seat, M D  Primary Care Physician:  Velna Hatchet, MD   Referring Provider: Velna Hatchet, MD     HPI:   RV 11-04-2017, patient had a bad fall 10-07-2017 , presented to ED. She was walking outdoors in [place] and she stumbled over a pump outlet, her shoe strings were caught, she fell. She bruised her left temple. She went to Dr. Ardeth Perfect who ordered an MRI which showed a small subdural. She was confused , and bradycardic. Her bleed was in the basal ganglia - and no mass effect was seen. She went to the ED and seen by  Dr. Madison Hickman and Dr. Erlinda Hong and was observed for 24 hours , d/c on the home medication.  Her left eye swell shut- she remains bradycardic and noted she is still feeling as if walking on egg-crates, dizziness. She had some diplopia . She has incontinence of urine, had it before now worse! She has been seen at [facility] urology before, but was not " retrained".  She is sleepier. 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     RV from 05-06-2017, for Mrs. Mccroskey seen here with her husband.  KESHONA KARTES is a 72 y.o. female , who has been for the first time ever hospitalized in January of this year.  She had presented to her primary care physician who referred her by ambulance across the road to the hospital.   Until then she had used her ASV compliantly, after 3 days in the hospital for a cardiac workup she returned home. The physician asked her orientation questions, and she apparently couldn't answer- was diagnosed with dementia ! Atrial fibrillation, CHF and she underwent cardioversion, remains anticoagulated. She is back in a fib. She developed urinary incontinence, nocturia has slowed down. She still struggles with the CPAP since her admission- unsure why- but I noticed she lost a lot of weight- 50 pounds and she may not need the CPAP or not at current settings. There is no evidence of dementia  now, may be it was delirium? She could not tolerate the large CPAP mask- no longer fits.  She has returned to the Eson nasal Mask in medium.    ASV is set at 5 cm EPAP, minimum pressure support of  3 cm and maximum 15 cm water pressure. AHI is 18.6 on this setting,  high air leaks. 05-04-2017. HST will be ordered to confirm to need for CPAP or not.      I have the pleasure of seeing Mrs. Forand today on 10/30/2016 in a follow-up visit after CPAP titration performed on 06/25/2016, the patient was titrated first to CPAP but then this was switched to a BiPAP setting which she tolerated better. She did very well with a ASV setting of 5 cm EPAP, a minimum pressure support of 3 cm and a maximum pressure support of 15 cm. She also had less and less arousals as the night went on not just due to respiratory factors but also due to an increased oxygen nadir. She was fitted with an Eson nasal mask. She tolerated ASV much better than BiPAP. I have now the pleasure of seeing the first compliance visit the patient has used the machine 83% for the last 30 days over 4 hours with an average user time of 6 hours and 7 minutes, ASV settings as quoted above her residual AHI is 4.6 of which 3.5  by hypopneas.      Original Consult: seen here as in a referral from Dr. Ardeth Perfect for a new sleep apnea evaluation.   Chief complaint according to patient : "I was diagnosed with apnea but could not get CPAP to work right". Mrs. Angelee Bahr husband is also a patient of my sleep practice, she was referred by her cardiologist, Dr. Cathie Olden, for sleep evaluation to the [facility] sleep lab. She reports that atrial fibrillation was her main risk factor. In addition she had hypertension and a body mass index exceeding 30. Atrial fibrillation has been persistent, she is chronically anticoagulated on Eloquis. The patient reportedly has a cardiac valve regurgitation, diagnosed by echocardiogram but I do not have the original diagnostic tests  available.The patient also has a history of hyperlipidemia, panic attacks,, irritable bowel syndrome, hypertension, urinary incontinence.   Sleep habits are as follows: The patient likes to read and she goes to bed but she falls asleep very promptly and feels that she can sleep uninterrupted through the night. She remembers vivid, beautiful dreams not nightmarish in character but colorful and very interesting.  h she shares the bedroom with her husband , who reports her to snore very, very loudly. He is hard of hearing and not normally bothered.  She usually retreats to her bedroom by about 10 PM starts to read and is asleep within 15-30 minutes. She prefers to sleep on her back and on her side. She sleeps on 2 thin, firm pillows.Her husband reports that she seems to be gasping for air and still snoring while using CPAP.  Sleep medical history and family sleep history: Mrs. Huebsch maternal grandmother died at age 12 of a sudden cardiac death, her mother collapsed and fell hitting her had never regaining consciousness died at age 35. Mrs. Buresh has undergone a tonsillectomy in 1963, she had frequent falls and broken both ankles, left wrist and left collarbone. Social history: married, retired, does not drink caffeine, lifelong non smoker, ETOH seldomly.   Her cholesterol and her last labs from April was 188 total, triglycerides 101, HDL was 44, creatinine was 0.7 liver function tests only marginal elevation of ALT at 41 units per liter was noted. She is mildly leukopenic at 3.97K, but she does have no evidence of anemia. TSH was in normal limits. I reviewed the patient's previous to sleep studies she was admitted for a split night polysomnography on 01/11/2015. The patient had frequent hypopneas and apneas the RDI was 56.7 which is very high. She did not have Cheyne-Stokes breathing of central apneas. She seemed to have slept all night in supine sleep - She was titrated to CPAP and establishing a high pressure  of 19 cm water,  and her AHI did not respond it remained in the 2 digits. Her AHI was 65.5 on CPAP of 19 cm water she was changed to bilevel and remained with an AHI in the 60s under 23/19 cm water she was asked back for full night BiPAP titration on 04/11/2015 and her RDI exacerbated to 44.5;  this same procedure of a bilevel titration again to a pressure of 19/15 cm water resulted in a residual AHI of 26.7.  Oxygen nadir was 90% saturation.  Overall,  the patient barely slept and she continued to have difficulties using the machine at home. An optimal pressure was never identified.  Review of Systems:  Out of a complete 14 system review, the patient complains of only the following symptoms, and all other reviewed systems  are negative. Snoring. Hypersomnia, excessive sleepiness. Epworth score 12 with vivid dreams now on ASV reduced to 12/24, Fatigue severity score 24 points.  r urinary incontinence, thalamic/ basal ganglia bleed- sleepier- diplopia.    Social History   Socioeconomic History  . Marital status: Married    Spouse name: Not on file  . Number of children: Not on file  . Years of education: Not on file  . Highest education level: Not on file  Occupational History  . Not on file  Social Needs  . Financial resource strain: Not on file  . Food insecurity:    Worry: Not on file    Inability: Not on file  . Transportation needs:    Medical: Not on file    Non-medical: Not on file  Tobacco Use  . Smoking status: Never Smoker  . Smokeless tobacco: Never Used  Substance and Sexual Activity  . Alcohol use: No  . Drug use: No  . Sexual activity: Not on file  Lifestyle  . Physical activity:    Days per week: Not on file    Minutes per session: Not on file  . Stress: Not on file  Relationships  . Social connections:    Talks on phone: Not on file    Gets together: Not on file    Attends religious service: Not on file    Active member of club or organization: Not on file      Attends meetings of clubs or organizations: Not on file    Relationship status: Not on file  . Intimate partner violence:    Fear of current or ex partner: Not on file    Emotionally abused: Not on file    Physically abused: Not on file    Forced sexual activity: Not on file  Other Topics Concern  . Not on file  Social History Narrative  . Not on file    Family History  Problem Relation Age of Onset  . Heart failure Mother   . Heart attack Father   . Hypertension Maternal Grandmother   . Heart attack Paternal Grandfather   . Breast cancer Maternal Aunt     Past Medical History:  Diagnosis Date  . Atrial fibrillation ([place])   . IBS (irritable bowel syndrome)   . Obesity (BMI 30-39.9) 07/11/2015  . OSA (obstructive sleep apnea) 01/17/2015   Severe with AHI 35.8/hr  . Panic attacks   . Urinary incontinence, nocturnal enuresis     Past Surgical History:  Procedure Laterality Date  . CARDIOVERSION N/A 04/02/2017   Procedure: CARDIOVERSION;  Surgeon: Skeet Latch, MD;  Location: [facility];  Service: Cardiovascular;  Laterality: N/A;  . TONSILLECTOMY      Current Outpatient Medications  Medication Sig Dispense Refill  . diltiazem (CARDIZEM CD) 360 MG 24 hr capsule Take 1 capsule (360 mg total) by mouth daily. 90 capsule 3  . losartan (COZAAR) 50 MG tablet Take 50 mg by mouth daily. Reported on 07/20/2015  5  . metoprolol succinate (TOPROL-XL) 25 MG 24 hr tablet TAKE 1 TABLET BY MOUTH  DAILY 90 tablet 2  . mirabegron ER (MYRBETRIQ) 25 MG TB24 tablet Take 25 mg by mouth daily. Taking as a sample    . rivaroxaban (XARELTO) 20 MG TABS tablet Take 1 tablet (20 mg total) by mouth daily with supper. 30 tablet 5  . sertraline (ZOLOFT) 100 MG tablet Take 200 mg by mouth at bedtime.      No current facility-administered medications for  this visit.     Allergies as of 11/04/2017 - Review Complete 11/04/2017  Allergen Reaction Noted  . Sulfa antibiotics  08/24/2014  .  Epinephrine  06/12/2016    Vitals: BP 127/88   Pulse (!) 43   Ht 5\' 3"  (1.6 m)   Wt 188 lb (85.3 kg)   BMI 33.30 kg/m  Last Weight:  Wt Readings from Last 1 Encounters:  11/04/17 188 lb (85.3 kg)   UXL:KGMW mass index is 33.3 kg/m.     Last Height:   Ht Readings from Last 1 Encounters:  11/04/17 5\' 3"  (1.6 m)    Physical exam:  General: The patient is awake, alert and appears not in acute distress. The patient is well groomed. Head: Normocephalic, atraumatic. Neck is supple. Mallampati 2  neck circumference:15. Nasal airflow congested, but patent, TMJ click is not evident. Retrognathia is not seen.  Cardiovascular: irregular rate and rhythm, without  murmurs or carotid bruit, and without distended neck veins. Respiratory: Lungs are clear to auscultation. Skin:  Without evidence of edema, or rash Trunk: BMI is elevated. The patient's posture is erect.  Neurologic exam : The patient is awake and alert, oriented to place and time.   Memory subjective described as intact. Attention span & concentration ability appears normal.  Speech is fluent,  without dysarthria, dysphonia or aphasia.  Mood and affect are appropriate.  Cranial nerves: Pupils are equal and briskly reactive to light. Extraocular movements  in vertical and horizontal planes intact and without nystagmus. Visual fields by finger perimetry are intact. Hearing intact  Facial sensation intact to fine touch. Facial motor strength is symmetric and tongue and uvula move midline. Shoulder shrug was symmetrical.  Strength within normal limits. Stance is stable and normal.  Deep tendon reflexes: in the  upper and lower extremities are brisk, mostly on the left -intact. Babinski maneuver response is downgoing.   Assessment:  After physical and neurologic examination, review of laboratory studies,  Personal review of imaging studies, reports of other /same  Imaging studies, results of polysomnography and / or neurophysiology  testing and pre-existing records as far as provided in visit., my assessment is   1) fall related traumatic bleed, not sub-dural but basal ganglia.  Right sided bleed is explaining her sleepiness-  but worried about incontinence.   2)Will need a repeat CT only- will need to rule out increased pressure on the brain.     In summary, I have not addressed her sleeping disorder today - Mrs. Dlugosz has several risk factors and obviously has been diagnosed with severe sleep apnea but she could not respond to CPAP nor BiPAP.  She is now on ASV and initially much better- but after her cardiac decompensation and hopital admission lost 50 pounds - and now her mask is not fitting, she I has high air leaks, high residual AHI of 18 plus.  She has treatment emergent central sleep apnea.    I spent more than 30 minutes of face to face time with the patient. Greater than 50% of time was spent in counseling and coordination of care. We have discussed the diagnosis and differential and I answered the patient's questions.    Plan:  Treatment plan and additional workup :  CT brain, non contrast- ASAP.  compare to MRI from last week.   Cervical spine MRI-   Referral to uro-gynecology.   Patient agrees.    Larey Seat, MD 02/13/7251, 66:44 AM   Certified in Neurology by ABPN Certified  in Sleep Medicine by [facility] [address] [place], [place] [address]

## 2017-11-05 ENCOUNTER — Telehealth: Payer: Self-pay | Admitting: Cardiovascular Disease

## 2017-11-05 MED ORDER — RIVAROXABAN 20 MG PO TABS: 20.0000 mg | ORAL_TABLET | Freq: Every day | ORAL | 5 refills | Status: DC

## 2017-11-05 NOTE — Telephone Encounter (Signed)
Pt is a 72 yr old female last weight was 188 lbs on 07/23/17. Her last SCr was 0.84 on 10/24/17. CrCl is 80 mL/min. Will refill her Xarelto 20mg  QD as she has decided to stay on this drug instead of Coumadin.

## 2017-11-05 NOTE — Telephone Encounter (Signed)
New message   Pt c/o medication issue:  1. Name of Medication:   metoprolol succinate (TOPROL-XL) 25 MG 24 hr tablet   2. How are you currently taking this medication (dosage and times per day)?1 tablet daily  3. Are you having a reaction (difficulty breathing--STAT)?no   4. What is your medication issue? Patient states that may be slowing heart rate down. Please call to discuss.

## 2017-11-05 NOTE — Telephone Encounter (Signed)
Left detailed message for patient to call back and report pulse readings. I advised in message that both diltiazem and metoprolol can affect heart rate and that we need to know what kind of readings she has been getting. I advised her to call back with that information.

## 2017-11-06 NOTE — Telephone Encounter (Signed)
Patient came in to the office for samples and asked to see me. I spoke with her in the lobby and she advised that her BP and pulse were taken at an appointment yesterday and there was concern about low pulse. She states the reading was 45 bpm. I manually checked her pulse at 66 bpm. She denies symptoms of dizziness, light-headedness, or fatigue recently. I advised her to continue to monitor and that monitors can calculate incorrectly when the heart rhythm is irregular, as in her case with a fib. Patient verbalized understanding and agreement with plan and thanked me for checking on her.

## 2017-11-13 ENCOUNTER — Ambulatory Visit
Admission: RE | Admit: 2017-11-13 | Discharge: 2017-11-13 | Disposition: A | Discharge status: Home / Self Care | Payer: Medicare Other | Source: Ambulatory Visit | Attending: Neurology | Admitting: Neurology

## 2017-11-13 ENCOUNTER — Other Ambulatory Visit: Payer: Medicare Other

## 2017-11-13 DIAGNOSIS — R32 Unspecified urinary incontinence: Secondary | ICD-10-CM

## 2017-11-13 DIAGNOSIS — S062XAA Diffuse traumatic brain injury with loss of consciousness status unknown, initial encounter: Secondary | ICD-10-CM

## 2017-11-13 DIAGNOSIS — S062X9A Diffuse traumatic brain injury with loss of consciousness of unspecified duration, initial encounter: Secondary | ICD-10-CM

## 2017-11-13 DIAGNOSIS — G4719 Other hypersomnia: Secondary | ICD-10-CM

## 2017-11-13 DIAGNOSIS — I480 Paroxysmal atrial fibrillation: Secondary | ICD-10-CM

## 2017-11-13 DIAGNOSIS — Z7901 Long term (current) use of anticoagulants: Secondary | ICD-10-CM

## 2017-11-13 DIAGNOSIS — R001 Bradycardia, unspecified: Secondary | ICD-10-CM

## 2017-11-14 ENCOUNTER — Telehealth: Payer: Self-pay | Admitting: *Deleted

## 2017-11-14 NOTE — Telephone Encounter (Signed)
-----   Message from Larey Seat, MD sent at 11/14/2017 10:52 AM EDT ----- Patient fell on a trip to [place] in July 2019- Anticoagulated patient.  Scalp hematoma has resolved, intracerebral bleed has shrunken and is resolved.  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 The comparison film was just from 10-24-2017. Noted again were calcfications/ deposits in both carotid arteries.    No acute changes- healing well.

## 2017-11-14 NOTE — Telephone Encounter (Signed)
Called pt and discussed CT head shows no acute changes and shows she is healing well. Discussed her questions. She asked if she will need another CT repeated. I told her I would check with Dr. Brett Fairy and call her back, likely next week. She verbalized appreciation and had no further concerns.

## 2017-11-16 NOTE — Telephone Encounter (Signed)
No, Beth Haynes-  she won't need another imaging study now.  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  CD

## 2017-11-17 NOTE — Telephone Encounter (Signed)
Spoke with Beth Haynes and informed her that Dr. Brett Fairy said she does not need another imaging study done. However we can do one if needed. Informed her that Dr. Brett Fairy wanted her to follow up in 6 months. Patient verbalized understanding. She wanted to review her CT results again which I did. She verbalized appreciation. Scheduled Beth Haynes for Monday 04/27/2018 @ 2:30 pm arrival time 2:00. Beth Haynes aware she will receive a reminder call when it gets closer to appt time.

## 2018-03-26 ENCOUNTER — Other Ambulatory Visit: Payer: Self-pay | Admitting: Cardiovascular Disease

## 2018-04-27 ENCOUNTER — Ambulatory Visit: Payer: Self-pay | Admitting: Neurology

## 2018-05-04 ENCOUNTER — Other Ambulatory Visit: Payer: Self-pay | Admitting: Cardiovascular Disease

## 2018-05-05 NOTE — Telephone Encounter (Signed)
73 yo, 188 lbs, Scr 0.84 from 10/24/17 Crcl 3ml/min Last OV 07/23/17

## 2018-06-09 ENCOUNTER — Telehealth: Payer: Self-pay | Admitting: Physician Assistant

## 2018-06-09 NOTE — Progress Notes (Signed)
Virtual Visit via Telephone Note   This visit type was conducted due to national recommendations for restrictions regarding the COVID-19 Pandemic (e.g. social distancing) in an effort to limit this patient's exposure and mitigate transmission in our community.  Due to her co-morbid illnesses, this patient is at least at moderate risk for complications without adequate follow up.  This format is felt to be most appropriate for this patient at this time.  The patient did not have access to video technology/had technical difficulties with video requiring transitioning to audio format only (telephone).  All issues noted in this document were discussed and addressed.  No physical exam could be performed with this format.  Please refer to the patient's chart for her  consent to telehealth for [facility].   Evaluation Performed:  Follow-up visit  Date:  06/10/2018   ID:  Beth, Haynes 1945/12/15, MRN [id number]  Patient Location: Home Provider Location: Home  PCP:  Velna Hatchet, MD  Cardiologist:  Mertie Moores, MD   Electrophysiologist:  None   Chief Complaint:  FU on CHF, AFib  History of Present Illness:    Beth Haynes is a 73 y.o. female with paroxysmal atrial fibrillation, hypertension, obstructive sleep apnea.  CHADS2-VASc=3 (female, 73 yo, HTN).  She was admitted in 02/2017 for decompensated diastoliccongestive heart failurein the setting ofatrial fibrillationwith rapid rate and UTI.  She ultimately underwent DCCV but had early return of atrial fibrillation.  She was last seen in 04/2017.  She had a fall in 02/7492 complicated by a subdural hematoma.    Today, she notes that she is doing well.  She has not had chest discomfort, significant shortness of breath, syncope, orthopnea or leg swelling.  She did have one other fall but did not injure herself.  She tripped on a tree root.  She has not had any or hematuria.  The patient does not have symptoms concerning for COVID-19  infection (fever, chills, cough, or new shortness of breath).    Past Medical History:  Diagnosis Date  . Atrial fibrillation ([place])   . IBS (irritable bowel syndrome)   . Obesity (BMI 30-39.9) 07/11/2015  . OSA (obstructive sleep apnea) 01/17/2015   Severe with AHI 35.8/hr  . Panic attacks   . Urinary incontinence, nocturnal enuresis    Past Surgical History:  Procedure Laterality Date  . CARDIOVERSION N/A 04/02/2017   Procedure: CARDIOVERSION;  Surgeon: Skeet Latch, MD;  Location: [facility];  Service: Cardiovascular;  Laterality: N/A;  . TONSILLECTOMY       Current Meds  Medication Sig  . Cholecalciferol 1.25 MG (50000 UT) TABS Take 1 tablet by mouth once a week.  . diltiazem (CARDIZEM CD) 360 MG 24 hr capsule TAKE 1 CAPSULE(360 MG) BY MOUTH DAILY  . losartan (COZAAR) 50 MG tablet Take 50 mg by mouth daily. Reported on 07/20/2015  . metoprolol succinate (TOPROL-XL) 25 MG 24 hr tablet TAKE 1 TABLET BY MOUTH  DAILY  . mirabegron ER (MYRBETRIQ) 25 MG TB24 tablet Take 25 mg by mouth daily. Taking as a sample  . sertraline (ZOLOFT) 100 MG tablet Take 200 mg by mouth at bedtime.   Alveda Reasons 20 MG TABS tablet TAKE ONE TABLET BY MOUTH DAILY WITH SUPPER     Allergies:   Sulfa antibiotics and Epinephrine   Social History   Tobacco Use  . Smoking status: Never Smoker  . Smokeless tobacco: Never Used  Substance Use Topics  . Alcohol use: No  . Drug  use: No     Family Hx: The patient's family history includes Breast cancer in her maternal aunt; Heart attack in her father and paternal grandfather; Heart failure in her mother; Hypertension in her maternal grandmother.  ROS:   Please see the history of present illness.    All other systems reviewed and are negative.   Prior CV studies:   The following studies were reviewed today:  Chest CTA 02/20/17 IMPRESSION: 1. No evidence of pulmonary embolism. 2. The appearance of the chest suggests congestive heart failure,  including a background of interstitial pulmonary edema, as well as moderate right and small left pleural effusions. 3. Cardiomegaly with left atrial dilatation. 4. Aortic atherosclerosis, in addition to three-vessel coronary artery disease. Assessment for potential risk factor modification, dietary therapy or pharmacologic therapy may be warranted, if clinically indicated. 5. There are calcifications of the mitral annulus. Echocardiographic correlation for evaluation of potential valvular dysfunction may be warranted if clinically indicated.   Echo 02/21/17 EF 55-60, no RWMA, trivial AI, MAC, mild MR, severe LAE   Echo 12/07/14 Moderate concentric LVH, EF 60-65, normal wall motion, grade 2 diastolic dysfunction, MAC, mild RAE, PASP 36   Event monitor 9/16 NSR, paroxysmal atrial fibrillation   Labs/Other Tests and Data Reviewed:    EKG:  No ECG reviewed.  Recent Labs: 10/24/2017: ALT 18; BUN 15; Creatinine, Ser 0.80; Hemoglobin 16.0; Platelets 231; Potassium 4.2; Sodium 141   Recent Lipid Panel Lab Results  Component Value Date/Time   CHOL 127 02/21/2017 08:36 AM   TRIG 66 02/21/2017 08:36 AM   HDL 37 (L) 02/21/2017 08:36 AM   CHOLHDL 3.4 02/21/2017 08:36 AM   LDLCALC 77 02/21/2017 08:36 AM   From [employer] Tool    Wt Readings from Last 3 Encounters:  06/10/18 170 lb (77.1 kg)  11/04/17 188 lb (85.3 kg)  10/24/17 165 lb (74.8 kg)     Objective:    Vital Signs:  Ht _0  (1.6 m)   Wt 170 lb (77.1 kg)   BMI 30.11 kg/m    VITAL SIGNS:  reviewed GEN:  no acute distress RESPIRATORY:  No labored breathing during conversation NEURO:  Alert and oriented. PSYCH:  She seems to be in good spirits.  ASSESSMENT & PLAN:    Persistent atrial fibrillation Overall stable.  She is tolerating anticoagulation well.  Hemoglobin and creatinine in September 2019 were normal.  Chronic diastolic CHF (congestive heart failure) (HCC) NYHA 2.  Volume status seems to be stable.  Continue  current management.  Essential hypertension She is unable to check her blood pressure.  She does not think that she could afford a blood pressure cuff.  I will try to arrange a blood pressure cuff through our [facility].  COVID-19 Education: The signs and symptoms of COVID-19 were discussed with the patient and how to seek care for testing (follow up with PCP or arrange E-visit).  The importance of social distancing was discussed today.  Time:   Today, I have spent 16 minutes with the patient with telehealth technology discussing the above problems.     Medication Adjustments/Labs and Tests Ordered: Current medicines are reviewed at length with the patient today.  Concerns regarding medicines are outlined above.   Tests Ordered: No orders of the defined types were placed in this encounter.   Medication Changes: No orders of the defined types were placed in this encounter.   Disposition:  Follow up in 6 month(s)  Signed, Richardson Dopp, PA-C  06/10/2018 9:37 AM    [facility]

## 2018-06-09 NOTE — Telephone Encounter (Signed)
PHONE (landline) visit on 06/10/2018     Phone Call to obtain consent   -  06/09/2018         Virtual Visit Pre-Appointment Phone Call  "(Name), I am calling you today to discuss your upcoming appointment. We are currently trying to limit exposure to the virus that causes COVID-19 by seeing patients at home rather than in the office."  1. "What is the BEST phone number to call the day of the visit?" - include this in appointment notes  2. Do you have or have access to (through a family member/friend) a smartphone with video capability that we can use for your visit?" a. If yes - list this number in appt notes as cell (if different from BEST phone #) and list the appointment type as a VIDEO visit in appointment notes b. If no - list the appointment type as a PHONE visit in appointment notes  3. Confirm consent - "In the setting of the current Covid19 crisis, you are scheduled for a (phone or video) visit with your provider on (date) at (time).  Just as we do with many in-office visits, in order for you to participate in this visit, we must obtain consent.  If you'd like, I can send this to your mychart (if signed up) or email for you to review.  Otherwise, I can obtain your verbal consent now.  All virtual visits are billed to your insurance company just like a normal visit would be.  By agreeing to a virtual visit, we'd like you to understand that the technology does not allow for your provider to perform an examination, and thus may limit your provider's ability to fully assess your condition. If your provider identifies any concerns that need to be evaluated in person, we will make arrangements to do so.  Finally, though the technology is pretty good, we cannot assure that it will always work on either your or our end, and in the setting of a video visit, we may have to convert it to a phone-only visit.  In either situation, we cannot ensure that we have a secure connection.  Are you  willing to proceed?" STAFF: Did the patient verbally acknowledge consent to telehealth visit? Document YES/NO here: YES  4. Advise patient to be prepared - "Two hours prior to your appointment, go ahead and check your blood pressure, pulse, oxygen saturation, and your weight (if you have the equipment to check those) and write them all down. When your visit starts, your provider will ask you for this information. If you have an Apple Watch or Kardia device, please plan to have heart rate information ready on the day of your appointment. Please have a pen and paper handy nearby the day of the visit as well."  5. Give patient instructions for MyChart download to smartphone OR Doximity/Doxy.me as below if video visit (depending on what platform provider is using)  6. Inform patient they will receive a phone call 15 minutes prior to their appointment time (may be from unknown caller ID) so they should be prepared to answer    TELEPHONE CALL NOTE  Beth Haynes has been deemed a candidate for a follow-up tele-health visit to limit community exposure during the Covid-19 pandemic. I spoke with the patient via phone to ensure availability of phone/video source, confirm preferred email & phone number, and discuss instructions and expectations.  I reminded Beth Haynes to be prepared with any vital sign and/or heart rhythm information  that could potentially be obtained via home monitoring, at the time of her visit. I reminded Beth Haynes to expect a phone call prior to her visit.  Thayer Headings 06/09/2018 4:38 PM   INSTRUCTIONS FOR DOWNLOADING THE MYCHART APP TO SMARTPHONE  - The patient must first make sure to have activated MyChart and know their login information - If Apple, go to App Store and type in MyChart in the search bar and download the app. If Android, ask patient to go to [employer] and type in Omega in the search bar and download the app. The app is free but as with any other app  downloads, their phone may require them to verify saved payment information or Apple/Android password.  - The patient will need to then log into the app with their MyChart username and password, and select [facility] as their healthcare provider to link the account. When it is time for your visit, go to the MyChart app, find appointments, and click Begin Video Visit. Be sure to Select Allow for your device to access the Microphone and Camera for your visit. You will then be connected, and your provider will be with you shortly.  **If they have any issues connecting, or need assistance please contact MyChart service desk [phone])**  **If using a computer, in order to ensure the best quality for their visit they will need to use either of the following Internet Browsers: [employer], or Google Chrome**  IF USING DOXIMITY or DOXY.ME - The patient will receive a link just prior to their visit by text.     FULL LENGTH CONSENT FOR TELE-HEALTH VISIT   I hereby voluntarily request, consent and authorize [facility] and its employed or contracted physicians, physician assistants, nurse practitioners or other licensed health care professionals (the Practitioner), to provide me with telemedicine health care services (the Services") as deemed necessary by the treating Practitioner. I acknowledge and consent to receive the Services by the Practitioner via telemedicine. I understand that the telemedicine visit will involve communicating with the Practitioner through live audiovisual communication technology and the disclosure of certain medical information by electronic transmission. I acknowledge that I have been given the opportunity to request an in-person assessment or other available alternative prior to the telemedicine visit and am voluntarily participating in the telemedicine visit.  I understand that I have the right to withhold or withdraw my consent to the use of telemedicine  in the course of my care at any time, without affecting my right to future care or treatment, and that the Practitioner or I may terminate the telemedicine visit at any time. I understand that I have the right to inspect all information obtained and/or recorded in the course of the telemedicine visit and may receive copies of available information for a reasonable fee.  I understand that some of the potential risks of receiving the Services via telemedicine include:   Delay or interruption in medical evaluation due to technological equipment failure or disruption;  Information transmitted may not be sufficient (e.g. poor resolution of images) to allow for appropriate medical decision making by the Practitioner; and/or   In rare instances, security protocols could fail, causing a breach of personal health information.  Furthermore, I acknowledge that it is my responsibility to provide information about my medical history, conditions and care that is complete and accurate to the best of my ability. I acknowledge that Practitioner's advice, recommendations, and/or decision may be based on factors not within  their control, such as incomplete or inaccurate data provided by me or distortions of diagnostic images or specimens that may result from electronic transmissions. I understand that the practice of medicine is not an exact science and that Practitioner makes no warranties or guarantees regarding treatment outcomes. I acknowledge that I will receive a copy of this consent concurrently upon execution via email to the email address I last provided but may also request a printed copy by calling the office of [facility].    I understand that my insurance will be billed for this visit.   I have read or had this consent read to me.  I understand the contents of this consent, which adequately explains the benefits and risks of the Services being provided via telemedicine.   I have been provided ample  opportunity to ask questions regarding this consent and the Services and have had my questions answered to my satisfaction.  I give my informed consent for the services to be provided through the use of telemedicine in my medical care  By participating in this telemedicine visit I agree to the above.

## 2018-06-10 ENCOUNTER — Telehealth (INDEPENDENT_AMBULATORY_CARE_PROVIDER_SITE_OTHER): Payer: Medicare Other | Admitting: Physician Assistant

## 2018-06-10 ENCOUNTER — Other Ambulatory Visit: Payer: Self-pay

## 2018-06-10 ENCOUNTER — Encounter: Payer: Self-pay | Admitting: Physician Assistant

## 2018-06-10 VITALS — Ht 63.0 in | Wt 170.0 lb

## 2018-06-10 DIAGNOSIS — I10 Essential (primary) hypertension: Secondary | ICD-10-CM

## 2018-06-10 DIAGNOSIS — I4819 Other persistent atrial fibrillation: Secondary | ICD-10-CM

## 2018-06-10 DIAGNOSIS — Z7189 Other specified counseling: Secondary | ICD-10-CM

## 2018-06-10 DIAGNOSIS — I5032 Chronic diastolic (congestive) heart failure: Secondary | ICD-10-CM

## 2018-06-10 NOTE — Patient Instructions (Signed)
Medication Instructions:  No changes.  If you need a refill on your cardiac medications before your next appointment, please call your pharmacy.   Lab work: None   If you have labs (blood work) drawn today and your tests are completely normal, you will receive your results only by: Marland Kitchen MyChart Message (if you have MyChart) OR . A paper copy in the mail If you have any lab test that is abnormal or we need to change your treatment, we will call you to review the results.  Testing/Procedures: None   Follow-Up: At [facility], you and your health needs are our priority.  As part of our continuing mission to provide you with exceptional heart care, we have created designated Provider Care Teams.  These Care Teams include your primary Cardiologist (physician) and Advanced Practice Providers (APPs -  Physician Assistants and Nurse Practitioners) who all work together to provide you with the care you need, when you need it. You will need a follow up appointment in:  6 months.  Please call our office 2 months in advance to schedule this appointment.  You may see Mertie Moores, MD or Richardson Dopp, PA-C   Any Other Special Instructions Will Be Listed Below (If Applicable).  Our service will arrange for you to get a blood pressure delivered to your home.

## 2018-06-11 ENCOUNTER — Telehealth: Payer: Self-pay | Admitting: Physician Assistant

## 2018-06-11 NOTE — Telephone Encounter (Signed)
Pt called the office this afternoon. She said someone from the office called, but did not recall a name.  She had a Telehealth visit with Richardson Dopp yesterday (04/29) and wondered if someone just wanted to discuss her visit.

## 2018-07-23 ENCOUNTER — Other Ambulatory Visit: Payer: Self-pay

## 2018-07-23 DIAGNOSIS — C4491 Basal cell carcinoma of skin, unspecified: Secondary | ICD-10-CM

## 2018-07-23 HISTORY — DX: Basal cell carcinoma of skin, unspecified: C44.91

## 2018-07-23 HISTORY — PX: SKIN BIOPSY: SHX1

## 2018-08-03 ENCOUNTER — Telehealth: Payer: Self-pay

## 2018-08-03 NOTE — Telephone Encounter (Signed)
Unable to get in contact with the patient due to the line being busy. No option to leave a voicemail. I will attempt to call back later.    If patient calls back please convert his office visit into a mychart video visit with Megan.

## 2018-08-04 ENCOUNTER — Ambulatory Visit: Payer: Self-pay | Admitting: Adult Health

## 2018-08-04 NOTE — Telephone Encounter (Signed)
Unable to get in contact with the patient. No option leave a voicemail, the phone kept ringing. Appt has been cancelled. Patient will need to call back and r/s.

## 2018-08-04 NOTE — Telephone Encounter (Signed)
I spoke to patient she wants to come into the office phone got disconnected. I called Patient back to schedule her apt the phone just rang and rang. If patient calls back please schedule her with AMY Jinny Blossom has no openings . Thanks Hinton Dyer.

## 2018-08-11 ENCOUNTER — Other Ambulatory Visit: Payer: Self-pay | Admitting: Cardiovascular Disease

## 2018-09-28 ENCOUNTER — Other Ambulatory Visit: Payer: Self-pay | Admitting: Cardiovascular Disease

## 2018-09-30 ENCOUNTER — Encounter: Payer: Self-pay | Admitting: *Deleted

## 2018-11-13 ENCOUNTER — Other Ambulatory Visit: Payer: Self-pay | Admitting: Cardiovascular Disease

## 2018-11-13 ENCOUNTER — Telehealth: Payer: Self-pay | Admitting: Cardiovascular Disease

## 2018-11-13 NOTE — Telephone Encounter (Signed)
Xarelto 20mg  refill request received; pt is 73 years old, weight-77.1kg, Crea-0.80 on 06/26/2018 via [facility], last seen by Richardson Dopp on 06/10/2018, Diagnosis-Afib, CrCl-75.28ml/min; Dose is appropriate based on dosing criteria. Will send in refill to requested pharmacy which is mail order, but from the message it states she is out so will call the pt to clarify.  Spoke with the pt and she states she does not need a refill but needs samples of Xarelto, she states she has refills and can go get it but the cost has gone up and wanted to see if she could get some meds from the office. Advised that I do not take care of the samples but I see the message was sent the person that handles it so someone should follow up with her. She was thankful and advised that someone should follow up soon. Advised that I would make a note and send again with this updated information.

## 2018-11-13 NOTE — Telephone Encounter (Addendum)
The pt is advised to contact [facility] [facility] as she feels that she may qualify for asst through them for her Xarelto. I gave her [employer] pt asst programs phone number and advised her to ask questions if she has any and to request that they mail her a application.  She is aware to complete her part of the application, obtain needed documents, and to bring all to the office so we can take care of the provider part of the application and fax all in to [employer].  She is also aware that we are leaving her 1 sample bottle of Xarelto at our screening table in the downstairs lobby of Dr Elmar[facility] office for her to pick up as she states she only has 5 tablets left.

## 2018-11-13 NOTE — Telephone Encounter (Signed)
Leaving pt 1 bottle of Xarelto 20 mg tablets downstairs for pt to pick up.

## 2018-11-13 NOTE — Telephone Encounter (Signed)
Pt need a refill on Xarelto. Jeani Hawking, LPN, pt is also requesting samples of Xarelto. Could you please advise on this matter. Thank you

## 2018-11-13 NOTE — Telephone Encounter (Signed)
New Message  Patient calling the office for samples of medication:   1.  What medication and dosage are you requesting samples for? XARELTO 20 MG TABS tablet  2.  Are you currently out of this medication? Yes, patient has 5 pills left. States she has fell in the donut hole.

## 2018-11-13 NOTE — Telephone Encounter (Signed)
New Message  Patient is calling to see if she is still able to get a blood pressure machine to have at home. Please give patient a call back to discuss.

## 2018-11-17 ENCOUNTER — Telehealth: Payer: Self-pay | Admitting: Licensed Clinical Social Worker

## 2018-11-17 NOTE — Telephone Encounter (Signed)
CSW referred to assist patient with obtaining a BP cuff. CSW contacted patient to inform cuff will be delivered to home. Patient grateful for support and assistance. CSW available as needed. Jackie Jeffren Dombek, LCSW, CCSW-MCS [phone]  

## 2018-11-19 NOTE — Telephone Encounter (Signed)
Patient has been notified by CSW and is being sent a BP cuff

## 2019-01-06 ENCOUNTER — Encounter: Payer: Self-pay | Admitting: Cardiovascular Disease

## 2019-01-06 ENCOUNTER — Other Ambulatory Visit: Payer: Self-pay

## 2019-01-06 ENCOUNTER — Ambulatory Visit (INDEPENDENT_AMBULATORY_CARE_PROVIDER_SITE_OTHER): Payer: Medicare Other | Admitting: Cardiovascular Disease

## 2019-01-06 VITALS — BP 116/84 | HR 87 | Ht 62.0 in | Wt 197.1 lb

## 2019-01-06 DIAGNOSIS — I4819 Other persistent atrial fibrillation: Secondary | ICD-10-CM

## 2019-01-06 DIAGNOSIS — I5032 Chronic diastolic (congestive) heart failure: Secondary | ICD-10-CM | POA: Diagnosis not present

## 2019-01-06 NOTE — Progress Notes (Signed)
Cardiology Office Note   Date:  01/06/2019   ID:  Haynes, Beth 18-May-1945, MRN [id number]  PCP:  Velna Hatchet, MD  Cardiologist:   Mertie Moores, MD   No chief complaint on file.  Problem List 1.  Persistent atrial fib 2. Obstructive sleep apnea:   Having difficulty in getting a CPAP mask to fit.      Beth Haynes is a 73 y.o. female who presents for evaluation of possible atrial fib. She woke up with dizziness, room was spinning  Was seen by PA. Presented to the ER .  Head MRI was normal.  Symptoms sound like vertigo   She still has some occasional episodes of dizziness.   Has occasional episodes of palpitations ,  She thinks it may be related to panic attacks. No CP or dyspnea. Does not get any regular exercise.   Has [place] in [place]   Dec. 6, 2016:  Beth Haynes is seen today for follow up of her atrial fib.  BP is elevated today .   Ate some salty foods this weekend .   July 20, 2015:  Beth Haynes is seen back today .  Has PAF and OSA. She does not have a good CPAP mask that fits.   Dec. 13, 2017:  Was not able to find a CPAP that worked .   Could never get used to it  BP is elevated.  Has been eating more salt than usual - she and her husband eat popcorn every night.   February 26, 2017: Was hospitalized with CHF,  And Afib .  last week. Was not taking her meds and developed CHF  Is on Eliquis but is changing to Xarelto 20 mg a day   Mar 05, 2017:  Beth Haynes is doing better.  Still in AF,  HR is better controlled.  Was not taking the eliquis as directed initialy but has   July 23, 2017:  Beth Haynes is seen back today for history of paroxysmal atrial fibrillation.  She was seen by Richardson Dopp in March and was set up for cardioversion.  She was successfully cardioverted at that time but when she showed up for follow-up visit she was already back in atrial fibrillation.  We have adopted a rate control and anticoagulation strategy at that time.   She continues on  Xarelto.  Getting some exercise ,  [facility]  [facility] 06, 2019:  We discussed Beth Haynes,  [employer]  They go up every weekend .   Beth Haynes is seen today for follow-up of her atrial fibrillation.  She previously had paroxysmal atrial fibrillation but now it appears that she has persistent atrial fibrillation.  She has had cardioversions in the past but has gone back into atrial fibrillation several times.  At this point we have developed a rate control and anticoagulation strategy.  Has been exercising but has not lost weight. clothers are looser  Wt. Is 197 ( up 17 lbs )  Eats ice cream every night .   Past Medical History:  Diagnosis Date  . Atrial fibrillation ([place])   . Basal cell carcinoma 07/23/2018   right cheekbone-[id number]  . IBS (irritable bowel syndrome)   . Obesity (BMI 30-39.9) 07/11/2015  . OSA (obstructive sleep apnea) 01/17/2015   Severe with AHI 35.8/hr  . Panic attacks   . Urinary incontinence, nocturnal enuresis     Past Surgical History:  Procedure Laterality Date  .  CARDIOVERSION N/A 04/02/2017   Procedure: CARDIOVERSION;  Surgeon: Skeet Latch, MD;  Location: [facility];  Service: Cardiovascular;  Laterality: N/A;  . SKIN BIOPSY N/A 07/23/2018   mid upper back-moderate-0 tmt  . TONSILLECTOMY       Current Outpatient Medications  Medication Sig Dispense Refill  . Cholecalciferol 1.25 MG (50000 UT) TABS Take 1 tablet by mouth once a week.    . diltiazem (CARDIZEM CD) 360 MG 24 hr capsule TAKE 1 CAPSULE(360 MG) BY MOUTH DAILY 90 capsule 2  . losartan (COZAAR) 50 MG tablet Take 50 mg by mouth daily. Reported on 07/20/2015  5  . metoprolol succinate (TOPROL-XL) 25 MG 24 hr tablet Take 1 tablet (25 mg total) by mouth daily. Please make annual appt with Dr. Acie Fredrickson for future refills. Thank you 90 tablet 0  . mirabegron ER (MYRBETRIQ) 25 MG TB24 tablet Take 25 mg by mouth daily. Taking as a sample    . sertraline  (ZOLOFT) 100 MG tablet Take 200 mg by mouth at bedtime.     Alveda Reasons 20 MG TABS tablet TAKE ONE TABLET BY MOUTH DAILY WITH SUPPER 30 tablet 5   No current facility-administered medications for this visit.     Allergies:   Sulfa antibiotics and Epinephrine    Social History:  The patient  reports that she has never smoked. She has never used smokeless tobacco. She reports that she does not drink alcohol or use drugs.   Family History:  The patient's family history includes Breast cancer in her maternal aunt; Heart attack in her father and paternal grandfather; Heart failure in her mother; Hypertension in her maternal grandmother.    ROS: Noted in current history, otherwise negative.   Physical Exam: Blood pressure 116/84, pulse 87, height 5\' 2"  (1.575 m), weight 197 lb 1.9 oz (89.4 kg), SpO2 96 %.  GEN:  Elderly , obese female,  NAD  HEENT: Normal NECK: No JVD; No carotid bruits LYMPHATICS: No lymphadenopathy CARDIAC: Irreg. Irreg.  RESPIRATORY:  Clear to auscultation without rales, wheezing or rhonchi  ABDOMEN: Soft, non-tender, non-distended MUSCULOSKELETAL:  No edema; No deformity  SKIN: Warm and dry NEUROLOGIC:  Alert and oriented x 3   EKG:   January 06, 2019: Atrial fibrillation with a ventricular response at 87 beats a minute.  Right axis deviation.  Nonspecific ST and T wave abnormalities.  No changes from previous EKG.   Recent Labs: No results found for requested labs within last 8760 hours.    Lipid Panel    Component Value Date/Time   CHOL 127 02/21/2017 0836   TRIG 66 02/21/2017 0836   HDL 37 (L) 02/21/2017 0836   CHOLHDL 3.4 02/21/2017 0836   VLDL 13 02/21/2017 0836   LDLCALC 77 02/21/2017 0836      Wt Readings from Last 3 Encounters:  01/06/19 197 lb 1.9 oz (89.4 kg)  06/10/18 170 lb (77.1 kg)  11/04/17 188 lb (85.3 kg)      Other studies Reviewed: Additional studies/ records that were reviewed today include: . Review of the above records  demonstrates:    ASSESSMENT AND PLAN:  1.  Vertigo:   No recent issues   2.    atrial fib:  .has chronic AF , cont xarelto    3. Obstructive sleep apnea:     Stable   4. Essential HTN:     - BP is well controlled   5. Obesity:   She eats ice cream every night ,  Advised her to  avoid ice cream.   I've encouraged her to watch her diet and start a regular exercise program   Current medicines are reviewed at length with the patient today.  The patient does not have concerns regarding medicines.  The following changes have been made:  no change  Labs/ tests ordered today include:   Orders Placed This Encounter  Procedures  . EKG 12-Lead     Disposition:   FU with me in  6 months .      Mertie Moores, MD  01/06/2019 11:01 AM    [address], [place], [place]  [address] Phone: [phone]; Fax: [phone]   [facility]  [address] [place], [place]  [address] [phone]   Fax [phone]

## 2019-01-06 NOTE — Patient Instructions (Signed)
Medication Instructions:  Your physician recommends that you continue on your current medications as directed. Please refer to the Current Medication list given to you today.  *If you need a refill on your cardiac medications before your next appointment, please call your pharmacy*  Lab Work: None ordered  Testing/Procedures: None ordered  Follow-Up: At [facility], you and your health needs are our priority.  As part of our continuing mission to provide you with exceptional heart care, we have created designated Provider Care Teams.  These Care Teams include your primary Cardiologist (physician) and Advanced Practice Providers (APPs -  Physician Assistants and Nurse Practitioners) who all work together to provide you with the care you need, when you need it.  Your next appointment:   1 year(s)  The format for your next appointment:   In Person  Provider:   Philip Nahser, MD { 

## 2019-03-03 ENCOUNTER — Ambulatory Visit: Payer: Medicare Other | Attending: Internal Medicine

## 2019-03-03 DIAGNOSIS — Z23 Encounter for immunization: Secondary | ICD-10-CM | POA: Insufficient documentation

## 2019-03-03 NOTE — Progress Notes (Signed)
   Covid-19 Vaccination Clinic  Name:  Beth Haynes    MRN: [id number] DOB: 05-14-1945  03/03/2019  Beth Haynes was observed post Covid-19 immunization for 15 minutes without incidence. She was provided with Vaccine Information Sheet and instruction to access the V-Safe system.   Beth Haynes was instructed to call 911 with any severe reactions post vaccine: Marland Kitchen Difficulty breathing  . Swelling of your face and throat  . A fast heartbeat  . A bad rash all over your body  . Dizziness and weakness    Immunizations Administered    Name Date Dose VIS Date Route   Pfizer COVID-19 Vaccine 03/03/2019 12:39 PM 0.3 mL 01/22/2019 Intramuscular   Manufacturer: [place]   Lot: [id number]   [place]: [id number]

## 2019-03-24 ENCOUNTER — Ambulatory Visit: Payer: Medicare Other | Attending: Internal Medicine

## 2019-03-24 DIAGNOSIS — Z23 Encounter for immunization: Secondary | ICD-10-CM | POA: Insufficient documentation

## 2019-03-24 NOTE — Progress Notes (Signed)
   Covid-19 Vaccination Clinic  Name:  [name]    MRN: [id number] DOB: [birth year]  03/24/2019  [name] was observed post Covid-19 immunization for 15 minutes without incidence. She was provided with Vaccine Information Sheet and instruction to access the V-Safe system.   Ms. Burn was instructed to call 911 with any severe reactions post vaccine: Marland Kitchen Difficulty breathing  . Swelling of your face and throat  . A fast heartbeat  . A bad rash all over your body  . Dizziness and weakness    Immunizations Administered    Name Date Dose VIS Date Route   Pfizer COVID-19 Vaccine 03/24/2019  8:39 AM 0.3 mL 01/22/2019 Intramuscular   Manufacturer: [place]   Lot: [id number]   [place]: [id number]

## 2019-04-19 ENCOUNTER — Encounter (HOSPITAL_COMMUNITY): Payer: Self-pay

## 2019-04-20 ENCOUNTER — Other Ambulatory Visit (HOSPITAL_COMMUNITY): Payer: Self-pay | Admitting: Internal Medicine

## 2019-04-20 DIAGNOSIS — H539 Unspecified visual disturbance: Secondary | ICD-10-CM

## 2019-04-21 ENCOUNTER — Ambulatory Visit (HOSPITAL_COMMUNITY)
Admission: RE | Admit: 2019-04-21 | Discharge: 2019-04-21 | Disposition: A | Discharge status: Home / Self Care | Payer: Medicare Other | Source: Ambulatory Visit | Attending: Vascular Surgery | Admitting: Vascular Surgery

## 2019-04-21 ENCOUNTER — Other Ambulatory Visit: Payer: Self-pay

## 2019-04-21 DIAGNOSIS — H539 Unspecified visual disturbance: Secondary | ICD-10-CM | POA: Diagnosis present

## 2019-04-26 ENCOUNTER — Encounter: Payer: Self-pay | Admitting: Podiatry

## 2019-04-26 ENCOUNTER — Ambulatory Visit: Payer: Medicare Other | Admitting: Podiatry

## 2019-04-26 ENCOUNTER — Other Ambulatory Visit: Payer: Self-pay

## 2019-04-26 VITALS — BP 132/86 | HR 84 | Temp 97.4°F | Resp 16

## 2019-04-26 DIAGNOSIS — L84 Corns and callosities: Secondary | ICD-10-CM | POA: Diagnosis not present

## 2019-04-26 DIAGNOSIS — M79675 Pain in left toe(s): Secondary | ICD-10-CM | POA: Diagnosis not present

## 2019-04-26 DIAGNOSIS — E785 Hyperlipidemia, unspecified: Secondary | ICD-10-CM | POA: Insufficient documentation

## 2019-04-26 DIAGNOSIS — B351 Tinea unguium: Secondary | ICD-10-CM

## 2019-04-26 DIAGNOSIS — F419 Anxiety disorder, unspecified: Secondary | ICD-10-CM | POA: Insufficient documentation

## 2019-04-26 MED ORDER — CICLOPIROX 8 % EX SOLN: Freq: Every day | CUTANEOUS | 2 refills | Status: DC

## 2019-04-26 NOTE — Patient Instructions (Signed)
Ciclopirox nail solution What is this medicine? CICLOPIROX (sye kloe PEER ox) NAIL SOLUTION is an antifungal medicine. It used to treat fungal infections of the nails. This medicine may be used for other purposes; ask your health care provider or pharmacist if you have questions. COMMON BRAND NAME(S): [id number], Penlac What should I tell my health care provider before I take this medicine? They need to know if you have any of these conditions:  diabetes mellitus  history of seizures  HIV infection  immune system problems or organ transplant  large areas of burned or damaged skin  peripheral vascular disease or poor circulation  taking corticosteroid medication (including steroid inhalers, cream, or lotion)  an unusual or allergic reaction to ciclopirox, isopropyl alcohol, other medicines, foods, dyes, or preservatives  pregnant or trying to get pregnant  breast-feeding How should I use this medicine? This medicine is for external use only. Follow the directions that come with this medicine exactly. Wash and dry your hands before use. Avoid contact with the eyes, mouth or nose. If you do get this medicine in your eyes, rinse out with plenty of cool tap water. Contact your doctor or health care professional if eye irritation occurs. Use at regular intervals. Do not use your medicine more often than directed. Finish the full course prescribed by your doctor or health care professional even if you think you are better. Do not stop using except on your doctor's advice. Talk to your pediatrician regarding the use of this medicine in children. While this medicine may be prescribed for children as young as 12 years for selected conditions, precautions do apply. Overdosage: If you think you have taken too much of this medicine contact a poison control center or emergency room at once. NOTE: This medicine is only for you. Do not share this medicine with others. What if I miss a dose? If you miss a  dose, use it as soon as you can. If it is almost time for your next dose, use only that dose. Do not use double or extra doses. What may interact with this medicine? Interactions are not expected. Do not use any other skin products without telling your doctor or health care professional. This list may not describe all possible interactions. Give your health care provider a list of all the medicines, herbs, non-prescription drugs, or dietary supplements you use. Also tell them if you smoke, drink alcohol, or use illegal drugs. Some items may interact with your medicine. What should I watch for while using this medicine? Tell your doctor or health care professional if your symptoms get worse. Four to six months of treatment may be needed for the nail(s) to improve. Some people may not achieve a complete cure or clearing of the nails by this time. Tell your doctor or health care professional if you develop sores or blisters that do not heal properly. If your nail infection returns after stopping using this product, contact your doctor or health care professional. What side effects may I notice from receiving this medicine? Side effects that you should report to your doctor or health care professional as soon as possible:  allergic reactions like skin rash, itching or hives, swelling of the face, lips, or tongue  severe irritation, redness, burning, blistering, peeling, swelling, oozing Side effects that usually do not require medical attention (report to your doctor or health care professional if they continue or are bothersome):  mild reddening of the skin  nail discoloration  temporary burning or mild   stinging at the site of application This list may not describe all possible side effects. Call your doctor for medical advice about side effects. You may report side effects to FDA at 1-800-FDA-1088. Where should I keep my medicine? Keep out of the reach of children. Store at room temperature  between 15 and 30 degrees C (59 and 86 degrees F). Do not freeze. Protect from light by storing the bottle in the carton after every use. This medicine is flammable. Keep away from heat and flame. Throw away any unused medicine after the expiration date. NOTE: This sheet is a summary. It may not cover all possible information. If you have questions about this medicine, talk to your doctor, pharmacist, or health care provider.  2020 Elsevier/[employer] Standard (2007-05-04 16:49:20)  

## 2019-04-28 NOTE — Progress Notes (Signed)
Subjective:   Patient ID: Beth Haynes, female   DOB: 74 y.o.   MRN: [id number]   HPI 74 year old female presents the office today for evaluation of left fourth toe pain.  She states it feels like a corn.  She states that she is a Band-Aid it helps as long as it is not too loose.  She also states that on both of her big toenails she is having some nail discoloration.  It is hard to trim her nail.  She states that the left foot is longer in the right foot is wider it is hard to find shoes.  She does not want to nail trimmed today.  She has a history of bilateral ankle fractures requiring surgery.   Review of Systems  All other systems reviewed and are negative.  Past Medical History:  Diagnosis Date  . Atrial fibrillation ([place])   . Basal cell carcinoma 07/23/2018   right cheekbone-CX35FU  . IBS (irritable bowel syndrome)   . Obesity (BMI 30-39.9) 07/11/2015  . OSA (obstructive sleep apnea) 01/17/2015   Severe with AHI 35.8/hr  . Panic attacks   . Urinary incontinence, nocturnal enuresis     Past Surgical History:  Procedure Laterality Date  . CARDIOVERSION N/A 04/02/2017   Procedure: CARDIOVERSION;  Surgeon: Skeet Latch, MD;  Location: [facility];  Service: Cardiovascular;  Laterality: N/A;  . SKIN BIOPSY N/A 07/23/2018   mid upper back-moderate-0 tmt  . TONSILLECTOMY       Current Outpatient Medications:  .  Cholecalciferol 1.25 MG (50000 UT) TABS, Take 1 tablet by mouth once a week., Disp: , Rfl:  .  diltiazem (CARDIZEM CD) 360 MG 24 hr capsule, TAKE 1 CAPSULE(360 MG) BY MOUTH DAILY, Disp: 90 capsule, Rfl: 2 .  losartan (COZAAR) 50 MG tablet, Take 50 mg by mouth daily. Reported on 07/20/2015, Disp: , Rfl: 5 .  metoprolol succinate (TOPROL-XL) 25 MG 24 hr tablet, Take 1 tablet (25 mg total) by mouth daily. Please make annual appt with Dr. Acie Fredrickson for future refills. Thank you, Disp: 90 tablet, Rfl: 0 .  mirabegron ER (MYRBETRIQ) 25 MG TB24 tablet, Take 25 mg by mouth daily.  Taking as a sample, Disp: , Rfl:  .  sertraline (ZOLOFT) 100 MG tablet, Take 200 mg by mouth at bedtime. , Disp: , Rfl:  .  XARELTO 20 MG TABS tablet, TAKE ONE TABLET BY MOUTH DAILY WITH SUPPER, Disp: 30 tablet, Rfl: 5 .  ciclopirox (PENLAC) 8 % solution, Apply topically at bedtime. Apply over nail and surrounding skin. Apply daily over previous coat. After seven (7) days, may remove with alcohol and continue cycle., Disp: 6.6 mL, Rfl: 2  Allergies  Allergen Reactions  . Sulfa Antibiotics Other (See Comments)    Childhood allergy   . Epinephrine Other (See Comments)    Panic attacks           Objective:  Physical Exam  General: AAO x3, NAD  Dermatological: Nails in particular bilateral hallux nails are dystrophic, discolored with yellow-brown discoloration.  No significant pain today.  On the left fourth toe not able to identify any area of a corn however due to adductovarus she has almost a small minimal pinch callus and this seems to be where she has majority of tenderness.  Vascular: Dorsalis Pedis artery and Posterior Tibial artery pedal pulses are 2/4 bilateral with immedate capillary fill time. There is no pain with calf compression, swelling, warmth, erythema.   Neruologic: Grossly intact via light touch  bilateral.   Musculoskeletal: Adductovarus present of the fourth digit is resulting in a pinch callus.  This is where the majority tenderness is.  There is no edema to the toe there is no erythema or warmth.  No tenderness to the other areas of the digit.  Muscular strength 5/5 in all groups tested bilateral.  Gait: Unassisted, Nonantalgic.       Assessment:   Onychomycosis; left fourth digit pinch callus    Plan:  -Treatment options discussed including all alternatives, risks, and complications -Etiology of symptoms were discussed -No significant callus to debride today.  Dispensed various offloading pads.  In general we discussed different exercises she can do to  help strengthen her toes. -Penlac for nail fungus.  Return in about 3 weeks (around 05/17/2019) for toe pain/toenail issue.  Trula Slade DPM

## 2019-05-18 ENCOUNTER — Encounter: Payer: Self-pay | Admitting: Podiatry

## 2019-05-18 ENCOUNTER — Ambulatory Visit: Payer: Medicare Other | Admitting: Podiatry

## 2019-05-18 ENCOUNTER — Other Ambulatory Visit: Payer: Self-pay

## 2019-05-18 VITALS — Temp 98.3°F

## 2019-05-18 DIAGNOSIS — L84 Corns and callosities: Secondary | ICD-10-CM

## 2019-05-18 DIAGNOSIS — M79675 Pain in left toe(s): Secondary | ICD-10-CM

## 2019-05-18 MED ORDER — METOPROLOL SUCCINATE ER 25 MG PO TB24: 25.0000 mg | ORAL_TABLET | Freq: Every day | ORAL | 2 refills | Status: DC

## 2019-05-20 NOTE — Progress Notes (Signed)
Subjective: 74 year old female presents the office today for follow evaluation of pain to left fourth toe.  She states that her pain has resolved and she is able to walk and her balance is much better since I last saw her.  She denies any open sores and she has no new concerns.  No recent injury or changes otherwise. Denies any systemic complaints such as fevers, chills, nausea, vomiting. No acute changes since last appointment, and no other complaints at this time.   Objective: AAO x3, NAD DP/PT pulses palpable bilaterally, CRT less than 3 seconds At this time there is no pain left fourth toe.  There is no edema, erythema.  No significant hyperkeratotic tissue. No open lesions or pre-ulcerative lesions.  No pain with calf compression, swelling, warmth, erythema  Assessment: Resolved left fourth toe pain  Plan: -All treatment options discussed with the patient including all alternatives, risks, complications.  -Continue with offloading pads.  Discussed supportive shoes and also exercises help strengthen her toes muscles. -Continue topical antifungal. -Patient encouraged to call the office with any questions, concerns, change in symptoms.   Trula Slade DPM

## 2019-05-24 ENCOUNTER — Other Ambulatory Visit: Payer: Self-pay

## 2019-05-24 MED ORDER — RIVAROXABAN 20 MG PO TABS: ORAL_TABLET | ORAL | 5 refills | Status: DC

## 2019-05-24 NOTE — Telephone Encounter (Signed)
Pt last saw Dr Acie Fredrickson 01/06/19, last labs 06/16/18 Creat 0.8 at [facility] per [facility], age 74, weight 89.4kg, CrCl 87.07, based on CrCl pt is on appropriate dosage of Xarelto 20mg  QD.  Will refill rx.

## 2019-07-05 ENCOUNTER — Other Ambulatory Visit: Payer: Self-pay

## 2019-07-05 MED ORDER — DILTIAZEM HCL ER COATED BEADS 360 MG PO CP24: ORAL_CAPSULE | ORAL | 1 refills | Status: DC

## 2019-11-02 ENCOUNTER — Telehealth: Payer: Self-pay | Admitting: Cardiovascular Disease

## 2019-11-02 NOTE — Telephone Encounter (Signed)
Hey Lynn, LPN, can you please advise on this matter? Thanks  ?

## 2019-11-02 NOTE — Telephone Encounter (Signed)
**Note De-Identified  Obfuscation** The pt states that she cannot afford Xarelto as she has fallen into her donut hole.  We discussed pt asst through [facility] but she states that she has tried several times but has always been denied.  We then discussed her calling [facility] and applying over the phone with them and that there are no requirements or approvals needed with this program but that it will cost her $85 a month. She states that $85 a month is still too much for her.  We then discussed her switching to the only generic anticoagulant currently on the market which is Warfarin but she refuses to take "Rat poison".  The pt states that she will discuss [facility] with her husband and will call them if he agrees with their cost. She is aware to call me if she calls [facility] so I can send her Xarelto RX to their pharmacy [facility]) to fill for her if she applys.  She thanked me for calling her back to explain her options.

## 2019-11-02 NOTE — Telephone Encounter (Signed)
Patient calling the office for samples of medication:   1.  What medication and dosage are you requesting samples for? rivaroxaban (XARELTO) 20 MG TABS tablet   2.  Are you currently out of this medication? no   

## 2019-11-08 ENCOUNTER — Other Ambulatory Visit (HOSPITAL_COMMUNITY): Payer: Self-pay | Admitting: Internal Medicine

## 2019-11-08 DIAGNOSIS — R410 Disorientation, unspecified: Secondary | ICD-10-CM

## 2019-11-09 ENCOUNTER — Other Ambulatory Visit: Payer: Self-pay

## 2019-11-09 ENCOUNTER — Ambulatory Visit (HOSPITAL_COMMUNITY)
Admission: RE | Admit: 2019-11-09 | Discharge: 2019-11-09 | Disposition: A | Discharge status: Home / Self Care | Payer: Medicare Other | Source: Ambulatory Visit | Attending: Internal Medicine | Admitting: Internal Medicine

## 2019-11-09 DIAGNOSIS — R410 Disorientation, unspecified: Secondary | ICD-10-CM | POA: Diagnosis not present

## 2019-11-17 ENCOUNTER — Ambulatory Visit: Payer: Medicare Other | Admitting: Physician Assistant

## 2019-12-28 ENCOUNTER — Other Ambulatory Visit: Payer: Self-pay

## 2019-12-28 ENCOUNTER — Other Ambulatory Visit: Payer: Self-pay | Admitting: Cardiovascular Disease

## 2019-12-28 MED ORDER — RIVAROXABAN 20 MG PO TABS: ORAL_TABLET | ORAL | 5 refills | Status: DC

## 2019-12-28 MED ORDER — DILTIAZEM HCL ER COATED BEADS 360 MG PO CP24: ORAL_CAPSULE | ORAL | 0 refills | Status: DC

## 2019-12-28 NOTE — Telephone Encounter (Signed)
Pt calling stating that she needed samples of diltiazem and Xarelto. I explained to the pt that we do not have samples of diltiazem, but I could send in a new Rx to her pharmacy and that I also can leave her 2 weeks of samples for Xarelto, Lot# [id number] Exp: 02-23 and the patient assistance number for her to call to apply, to try and get a tier reduction. I also informed pt that she needed a refill sent to her pharmacy for Xarelto. Please address

## 2019-12-28 NOTE — Telephone Encounter (Signed)
Xarelto 20mg  refill request received & processed. Pt is 74 years old, weight-89.4 kg, Crea-0.90 on 06/21/2019 via [facility] on 06/21/2019 from [facility], last seen by Dr. Acie Fredrickson on 01/06/2019 and has an appt pending on 01/10/2020, Diagnosis-Afib, CrCl-76.32ml/min; Dose is appropriate based on dosing criteria. Will send in refill to requested pharmacy.

## 2019-12-28 NOTE — Telephone Encounter (Signed)
**Note De-Identified  Obfuscation** Pt requested and was given 2 more bottles of Xarelto samples from the office today. If more samples are requested I will contact her to discuss [facility] again as she may have forgotten our conversation.

## 2020-01-09 ENCOUNTER — Encounter: Payer: Self-pay | Admitting: Cardiovascular Disease

## 2020-01-09 NOTE — Progress Notes (Signed)
Cardiology Office Note   Date:  01/10/2020   ID:  Beth Haynes, DOB 03/07/1945, MRN [id number]  PCP:  Velna Hatchet, MD  Cardiologist:   Mertie Moores, MD   Chief Complaint  Patient presents with  . Atrial Fibrillation   Problem List 1.  Persistent atrial fib 2. Obstructive sleep apnea:   Having difficulty in getting a CPAP mask to fit.      Beth Haynes is a 73 y.o. female who presents for evaluation of possible atrial fib. She woke up with dizziness, room was spinning  Was seen by PA. Presented to the ER .  Head MRI was normal.  Symptoms sound like vertigo   She still has some occasional episodes of dizziness.   Has occasional episodes of palpitations ,  She thinks it may be related to panic attacks. No CP or dyspnea. Does not get any regular exercise.   Has [place] in [place]   Dec. 6, 2016:  Beth Haynes is seen today for follow up of her atrial fib.  BP is elevated today .   Ate some salty foods this weekend .   July 20, 2015:  Beth Haynes is seen back today .  Has PAF and OSA. She does not have a good CPAP mask that fits.   Dec. 13, 2017:  Was not able to find a CPAP that worked .   Could never get used to it  BP is elevated.  Has been eating more salt than usual - she and her husband eat popcorn every night.   February 26, 2017: Was hospitalized with CHF,  And Afib .  last week. Was not taking her meds and developed CHF  Is on Eliquis but is changing to Xarelto 20 mg a day   Mar 05, 2017:  Beth Haynes is doing better.  Still in AF,  HR is better controlled.  Was not taking the eliquis as directed initialy but has   July 23, 2017:  Beth Haynes is seen back today for history of paroxysmal atrial fibrillation.  She was seen by Richardson Dopp in March and was set up for cardioversion.  She was successfully cardioverted at that time but when she showed up for follow-up visit she was already back in atrial fibrillation.  We have adopted a rate control and  anticoagulation strategy at that time.  She continues on  Xarelto.  Getting some exercise ,  [facility]  [facility], 2019:  We discussed [place] [employer],  [employer]  They go up every weekend .   Beth Haynes is seen today for follow-up of her atrial fibrillation.  She previously had paroxysmal atrial fibrillation but now it appears that she has persistent atrial fibrillation.  She has had cardioversions in the past but has gone back into atrial fibrillation several times.  At this point we have developed a rate control and anticoagulation strategy.  Has been exercising but has not lost weight. clothers are looser  Wt. Is 197 ( up 17 lbs )  Eats ice cream every night .   Nov. 29, 2021: Beth Haynes is seen for her persistent atrial fib. Wt. Is   190 lbs  ( down 7 lbs from last year)  We discussed a new beer at [facility] .  Is walking daily .  Past Medical History:  Diagnosis Date  . Atrial fibrillation ([place])   . Basal cell carcinoma 07/23/2018   right cheekbone-CX35FU  .  IBS (irritable bowel syndrome)   . Obesity (BMI 30-39.9) 07/11/2015  . OSA (obstructive sleep apnea) 01/17/2015   Severe with AHI 35.8/hr  . Panic attacks   . Urinary incontinence, nocturnal enuresis     Past Surgical History:  Procedure Laterality Date  . CARDIOVERSION N/A 04/02/2017   Procedure: CARDIOVERSION;  Surgeon: Skeet Latch, MD;  Location: [facility];  Service: Cardiovascular;  Laterality: N/A;  . SKIN BIOPSY N/A 07/23/2018   mid upper back-moderate-0 tmt  . TONSILLECTOMY       Current Outpatient Medications  Medication Sig Dispense Refill  . budesonide (ENTOCORT EC) 3 MG 24 hr capsule Take 9 mg by mouth every morning.    . Cholecalciferol 1.25 MG (50000 UT) TABS Take 1 tablet by mouth once a week.    . ciclopirox (PENLAC) 8 % solution Apply topically at bedtime. Apply over nail and surrounding skin. Apply daily over previous coat. After seven (7) days, may remove with  alcohol and continue cycle. 6.6 mL 2  . diltiazem (CARDIZEM CD) 360 MG 24 hr capsule TAKE 1 CAPSULE(360 MG) BY MOUTH DAILY. 90 capsule 3  . losartan (COZAAR) 50 MG tablet Take 50 mg by mouth daily. Reported on 07/20/2015  5  . mirabegron ER (MYRBETRIQ) 25 MG TB24 tablet Take 25 mg by mouth daily. Taking as a sample    . rivaroxaban (XARELTO) 20 MG TABS tablet TAKE ONE TABLET BY MOUTH DAILY WITH SUPPER 30 tablet 5  . sertraline (ZOLOFT) 100 MG tablet Take 200 mg by mouth at bedtime.     . metoprolol succinate (TOPROL-XL) 50 MG 24 hr tablet Take 1 tablet (50 mg total) by mouth daily. Take with or immediately following a meal. 90 tablet 3   No current facility-administered medications for this visit.    Allergies:   Sulfa antibiotics and Epinephrine    Social History:  The patient  reports that she has never smoked. She has never used smokeless tobacco. She reports that she does not drink alcohol and does not use drugs.   Family History:  The patient's family history includes Breast cancer in her maternal aunt; Heart attack in her father and paternal grandfather; Heart failure in her mother; Hypertension in her maternal grandmother.    ROS: Noted in current history, otherwise negative.  Physical Exam: Blood pressure 138/80, pulse (!) 115, height 5\' 3"  (1.6 m), weight 190 lb 9.6 oz (86.5 kg), SpO2 95 %.  GEN:   Elderly female,  HEENT: Normal NECK: No JVD; No carotid bruits LYMPHATICS: No lymphadenopathy CARDIAC:  Irreg. Irreg.  RESPIRATORY:  Clear to auscultation without rales, wheezing or rhonchi  ABDOMEN: Soft, non-tender, non-distended MUSCULOSKELETAL:  No edema; No deformity  SKIN: Warm and dry NEUROLOGIC:  Alert and oriented x 3   EKG:    January 10, 2020: Atrial fibrillation with rapid ventricular response.   Recent Labs: No results found for requested labs within last 8760 hours.    Lipid Panel    Component Value Date/Time   CHOL 127 02/21/2017 0836   TRIG 66  02/21/2017 0836   HDL 37 (L) 02/21/2017 0836   CHOLHDL 3.4 02/21/2017 0836   VLDL 13 02/21/2017 0836   LDLCALC 77 02/21/2017 0836      Wt Readings from Last 3 Encounters:  01/10/20 190 lb 9.6 oz (86.5 kg)  01/06/19 197 lb 1.9 oz (89.4 kg)  06/10/18 170 lb (77.1 kg)      Other studies Reviewed: Additional studies/ records that were reviewed today  include: . Review of the above records demonstrates:    ASSESSMENT AND PLAN:  1.  Vertigo:    Stable for now   2.    atrial fib:   HR is a bit fast.   Will increase her toprol to 50 mg a day .  Cont Dilt at 360 mg    3. Obstructive sleep apnea:         4. Essential HTN:     -BP is failry well controlled.   5. Obesity:   Has lost 7 lbs.  Cont current exercise, diet , weight loss   Current medicines are reviewed at length with the patient today.  The patient does not have concerns regarding medicines.  The following changes have been made:  no change  Labs/ tests ordered today include:   Orders Placed This Encounter  Procedures  . EKG 12-Lead  . VAS [place] LOWER EXTREMITY ARTERIAL DUPLEX  . VAS [place] ABI WITH/WO TBI     Disposition:   FU with me in  6 months .      Mertie Moores, MD  01/10/2020 5:33 PM    [address], [place],   [address] Phone: [phone]; Fax: [phone]

## 2020-01-10 ENCOUNTER — Other Ambulatory Visit: Payer: Self-pay

## 2020-01-10 ENCOUNTER — Ambulatory Visit: Payer: Medicare Other | Admitting: Cardiovascular Disease

## 2020-01-10 ENCOUNTER — Encounter: Payer: Self-pay | Admitting: Cardiovascular Disease

## 2020-01-10 VITALS — BP 138/80 | HR 115 | Ht 63.0 in | Wt 190.6 lb

## 2020-01-10 DIAGNOSIS — I4811 Longstanding persistent atrial fibrillation: Secondary | ICD-10-CM

## 2020-01-10 DIAGNOSIS — I739 Peripheral vascular disease, unspecified: Secondary | ICD-10-CM | POA: Diagnosis not present

## 2020-01-10 DIAGNOSIS — R0989 Other specified symptoms and signs involving the circulatory and respiratory systems: Secondary | ICD-10-CM

## 2020-01-10 MED ORDER — METOPROLOL SUCCINATE ER 50 MG PO TB24: 50.0000 mg | ORAL_TABLET | Freq: Every day | ORAL | 3 refills | Status: DC

## 2020-01-10 MED ORDER — DILTIAZEM HCL ER COATED BEADS 360 MG PO CP24
ORAL_CAPSULE | ORAL | 3 refills | Status: DC
Start: 2020-01-10 — End: 2020-04-04

## 2020-01-10 NOTE — Patient Instructions (Signed)
Medication Instructions:  1) INCREASE Metoprolol Succinate to 50mg  once daily  *If you need a refill on your cardiac medications before your next appointment, please call your pharmacy*   Lab Work: None If you have labs (blood work) drawn today and your tests are completely normal, you will receive your results only by: Marland Kitchen MyChart Message (if you have MyChart) OR . A paper copy in the mail If you have any lab test that is abnormal or we need to change your treatment, we will call you to review the results.   Testing/Procedures: Your physician has requested that you have a lower or upper extremity arterial duplex. This test is an ultrasound of the arteries in the legs or arms. It looks at arterial blood flow in the legs and arms. Allow one hour for Lower and Upper Arterial scans. There are no restrictions or special instructions   Follow-Up: At [facility], you and your health needs are our priority.  As part of our continuing mission to provide you with exceptional heart care, we have created designated Provider Care Teams.  These Care Teams include your primary Cardiologist (physician) and Advanced Practice Providers (APPs -  Physician Assistants and Nurse Practitioners) who all work together to provide you with the care you need, when you need it.  We recommend signing up for the patient portal called "MyChart".  Sign up information is provided on this After Visit Summary.  MyChart is used to connect with patients for Virtual Visits (Telemedicine).  Patients are able to view lab/test results, encounter notes, upcoming appointments, etc.  Non-urgent messages can be sent to your provider as well.   To learn more about what you can do with MyChart, go to [email or web address].    Your next appointment:   1 year(s)  The format for your next appointment:   In Person  Provider:   You may see Mertie Moores, MD or one of the following Advanced Practice Providers on your designated Care  Team:    Richardson Dopp, PA-C  Robbie Lis, [place]    Other Instructions

## 2020-01-18 ENCOUNTER — Encounter (HOSPITAL_COMMUNITY): Payer: Medicare Other

## 2020-01-25 ENCOUNTER — Other Ambulatory Visit: Payer: Self-pay

## 2020-01-25 ENCOUNTER — Ambulatory Visit (HOSPITAL_COMMUNITY)
Admission: RE | Admit: 2020-01-25 | Discharge: 2020-01-25 | Disposition: A | Discharge status: Home / Self Care | Payer: Medicare Other | Source: Ambulatory Visit | Attending: Cardiology | Admitting: Cardiology

## 2020-01-25 DIAGNOSIS — I739 Peripheral vascular disease, unspecified: Secondary | ICD-10-CM | POA: Diagnosis not present

## 2020-01-25 DIAGNOSIS — R0989 Other specified symptoms and signs involving the circulatory and respiratory systems: Secondary | ICD-10-CM | POA: Insufficient documentation

## 2020-01-26 ENCOUNTER — Telehealth: Payer: Self-pay

## 2020-01-26 NOTE — Telephone Encounter (Signed)
-----   Message from Thayer Headings, MD sent at 01/25/2020  4:37 PM EST ----- No evidence of PAD

## 2020-01-26 NOTE — Telephone Encounter (Signed)
Attempted to call the patient with results, no answer. Left a message to call the office back.

## 2020-04-03 ENCOUNTER — Other Ambulatory Visit: Payer: Self-pay | Admitting: Cardiovascular Disease

## 2020-05-16 ENCOUNTER — Encounter (HOSPITAL_BASED_OUTPATIENT_CLINIC_OR_DEPARTMENT_OTHER): Payer: Self-pay

## 2020-05-16 ENCOUNTER — Inpatient Hospital Stay (HOSPITAL_BASED_OUTPATIENT_CLINIC_OR_DEPARTMENT_OTHER)
Admission: EM | Admit: 2020-05-16 | Discharge: 2020-05-23 | DRG: 813 | Disposition: A | Discharge status: Home Health | Payer: Medicare Other | Attending: Internal Medicine | Admitting: Internal Medicine

## 2020-05-16 ENCOUNTER — Other Ambulatory Visit: Payer: Self-pay

## 2020-05-16 DIAGNOSIS — Z7901 Long term (current) use of anticoagulants: Secondary | ICD-10-CM

## 2020-05-16 DIAGNOSIS — Z803 Family history of malignant neoplasm of breast: Secondary | ICD-10-CM | POA: Diagnosis not present

## 2020-05-16 DIAGNOSIS — F410 Panic disorder [episodic paroxysmal anxiety] without agoraphobia: Secondary | ICD-10-CM | POA: Diagnosis present

## 2020-05-16 DIAGNOSIS — D62 Acute posthemorrhagic anemia: Secondary | ICD-10-CM | POA: Diagnosis not present

## 2020-05-16 DIAGNOSIS — Z6833 Body mass index (BMI) 33.0-33.9, adult: Secondary | ICD-10-CM | POA: Diagnosis not present

## 2020-05-16 DIAGNOSIS — I959 Hypotension, unspecified: Secondary | ICD-10-CM | POA: Diagnosis not present

## 2020-05-16 DIAGNOSIS — T45515A Adverse effect of anticoagulants, initial encounter: Secondary | ICD-10-CM | POA: Diagnosis present

## 2020-05-16 DIAGNOSIS — Z882 Allergy status to sulfonamides status: Secondary | ICD-10-CM

## 2020-05-16 DIAGNOSIS — I4819 Other persistent atrial fibrillation: Secondary | ICD-10-CM | POA: Diagnosis present

## 2020-05-16 DIAGNOSIS — R54 Age-related physical debility: Secondary | ICD-10-CM | POA: Diagnosis present

## 2020-05-16 DIAGNOSIS — Z888 Allergy status to other drugs, medicaments and biological substances status: Secondary | ICD-10-CM

## 2020-05-16 DIAGNOSIS — I110 Hypertensive heart disease with heart failure: Secondary | ICD-10-CM | POA: Diagnosis present

## 2020-05-16 DIAGNOSIS — N179 Acute kidney failure, unspecified: Secondary | ICD-10-CM | POA: Diagnosis not present

## 2020-05-16 DIAGNOSIS — G9341 Metabolic encephalopathy: Secondary | ICD-10-CM | POA: Diagnosis not present

## 2020-05-16 DIAGNOSIS — I5032 Chronic diastolic (congestive) heart failure: Secondary | ICD-10-CM | POA: Diagnosis present

## 2020-05-16 DIAGNOSIS — R030 Elevated blood-pressure reading, without diagnosis of hypertension: Secondary | ICD-10-CM | POA: Diagnosis not present

## 2020-05-16 DIAGNOSIS — Z85828 Personal history of other malignant neoplasm of skin: Secondary | ICD-10-CM

## 2020-05-16 DIAGNOSIS — R040 Epistaxis: Secondary | ICD-10-CM

## 2020-05-16 DIAGNOSIS — R0902 Hypoxemia: Secondary | ICD-10-CM | POA: Diagnosis not present

## 2020-05-16 DIAGNOSIS — G4733 Obstructive sleep apnea (adult) (pediatric): Secondary | ICD-10-CM | POA: Diagnosis present

## 2020-05-16 DIAGNOSIS — Z20822 Contact with and (suspected) exposure to covid-19: Secondary | ICD-10-CM | POA: Diagnosis present

## 2020-05-16 DIAGNOSIS — K5090 Crohn's disease, unspecified, without complications: Secondary | ICD-10-CM | POA: Diagnosis present

## 2020-05-16 DIAGNOSIS — Z79899 Other long term (current) drug therapy: Secondary | ICD-10-CM

## 2020-05-16 DIAGNOSIS — I482 Chronic atrial fibrillation, unspecified: Secondary | ICD-10-CM | POA: Diagnosis not present

## 2020-05-16 DIAGNOSIS — E860 Dehydration: Secondary | ICD-10-CM | POA: Diagnosis not present

## 2020-05-16 DIAGNOSIS — E876 Hypokalemia: Secondary | ICD-10-CM | POA: Diagnosis present

## 2020-05-16 DIAGNOSIS — Z9119 Patient's noncompliance with other medical treatment and regimen: Secondary | ICD-10-CM

## 2020-05-16 DIAGNOSIS — E785 Hyperlipidemia, unspecified: Secondary | ICD-10-CM | POA: Diagnosis present

## 2020-05-16 DIAGNOSIS — D6832 Hemorrhagic disorder due to extrinsic circulating anticoagulants: Secondary | ICD-10-CM | POA: Diagnosis present

## 2020-05-16 DIAGNOSIS — Z8249 Family history of ischemic heart disease and other diseases of the circulatory system: Secondary | ICD-10-CM

## 2020-05-16 DIAGNOSIS — I4891 Unspecified atrial fibrillation: Secondary | ICD-10-CM | POA: Diagnosis not present

## 2020-05-16 DIAGNOSIS — H9190 Unspecified hearing loss, unspecified ear: Secondary | ICD-10-CM | POA: Diagnosis present

## 2020-05-16 DIAGNOSIS — R0602 Shortness of breath: Secondary | ICD-10-CM

## 2020-05-16 LAB — RESP PANEL BY RT-PCR (FLU A&B, COVID) ARPGX2
Influenza A by PCR: NEGATIVE
Influenza B by PCR: NEGATIVE
SARS Coronavirus 2 by RT PCR: NEGATIVE

## 2020-05-16 LAB — PROTIME-INR
INR: 2.6 — ABNORMAL HIGH (ref 0.8–1.2)
Prothrombin Time: 27.3 seconds — ABNORMAL HIGH (ref 11.4–15.2)

## 2020-05-16 LAB — CBC WITH DIFFERENTIAL/PLATELET
Abs Immature Granulocytes: 0.04 10*3/uL (ref 0.00–0.07)
Basophils Absolute: 0.1 10*3/uL (ref 0.0–0.1)
Basophils Relative: 1 %
Eosinophils Absolute: 0.2 10*3/uL (ref 0.0–0.5)
Eosinophils Relative: 2 %
HCT: 43.5 % (ref 36.0–46.0)
Hemoglobin: 14.3 g/dL (ref 12.0–15.0)
Immature Granulocytes: 1 %
Lymphocytes Relative: 21 %
Lymphs Abs: 1.4 10*3/uL (ref 0.7–4.0)
MCH: 30.3 pg (ref 26.0–34.0)
MCHC: 32.9 g/dL (ref 30.0–36.0)
MCV: 92.2 fL (ref 80.0–100.0)
Monocytes Absolute: 0.6 10*3/uL (ref 0.1–1.0)
Monocytes Relative: 9 %
Neutro Abs: 4.6 10*3/uL (ref 1.7–7.7)
Neutrophils Relative %: 66 %
Platelets: 216 10*3/uL (ref 150–400)
RBC: 4.72 MIL/uL (ref 3.87–5.11)
RDW: 15.1 % (ref 11.5–15.5)
WBC: 6.9 10*3/uL (ref 4.0–10.5)
nRBC: 0 % (ref 0.0–0.2)

## 2020-05-16 LAB — CBC
HCT: 46.2 % — ABNORMAL HIGH (ref 36.0–46.0)
Hemoglobin: 15.4 g/dL — ABNORMAL HIGH (ref 12.0–15.0)
MCH: 30 pg (ref 26.0–34.0)
MCHC: 33.3 g/dL (ref 30.0–36.0)
MCV: 90.1 fL (ref 80.0–100.0)
Platelets: 234 10*3/uL (ref 150–400)
RBC: 5.13 MIL/uL — ABNORMAL HIGH (ref 3.87–5.11)
RDW: 15.3 % (ref 11.5–15.5)
WBC: 6 10*3/uL (ref 4.0–10.5)
nRBC: 0 % (ref 0.0–0.2)

## 2020-05-16 LAB — BASIC METABOLIC PANEL
Anion gap: 6 (ref 5–15)
BUN: 19 mg/dL (ref 8–23)
CO2: 25 mmol/L (ref 22–32)
Calcium: 9.4 mg/dL (ref 8.9–10.3)
Chloride: 110 mmol/L (ref 98–111)
Creatinine, Ser: 0.76 mg/dL (ref 0.44–1.00)
GFR, Estimated: >60 mL/min (ref >60)
Glucose, Bld: 116 mg/dL — ABNORMAL HIGH (ref 70–99)
Potassium: 3.4 mmol/L — ABNORMAL LOW (ref 3.5–5.1)
Sodium: 141 mmol/L (ref 135–145)

## 2020-05-16 LAB — MRSA PCR SCREENING: MRSA by PCR: NEGATIVE

## 2020-05-16 LAB — MAGNESIUM: Magnesium: 2 mg/dL (ref 1.7–2.4)

## 2020-05-16 MED ORDER — POLYETHYLENE GLYCOL 3350 17 G PO PACK: 17.0000 g | PACK | Freq: Every day | ORAL | Status: DC | PRN

## 2020-05-16 MED ORDER — LORAZEPAM 2 MG/ML IJ SOLN
1.0000 mg | Freq: Once | INTRAMUSCULAR | Status: AC
  Administered 2020-05-16: 1 mg via INTRAVENOUS
  Filled 2020-05-16: qty 1

## 2020-05-16 MED ORDER — LOSARTAN POTASSIUM 50 MG PO TABS
50.0000 mg | ORAL_TABLET | Freq: Every day | ORAL | Status: DC
  Administered 2020-05-16 – 2020-05-19 (×4): 50 mg via ORAL
  Filled 2020-05-16 (×4): qty 1

## 2020-05-16 MED ORDER — DOCUSATE SODIUM 100 MG PO CAPS: 100.0000 mg | ORAL_CAPSULE | Freq: Two times a day (BID) | ORAL | Status: DC | PRN

## 2020-05-16 MED ORDER — TRANEXAMIC ACID 1000 MG/10ML IV SOLN
500.0000 mg | Freq: Once | INTRAVENOUS | Status: AC
  Administered 2020-05-16: 500 mg via TOPICAL

## 2020-05-16 MED ORDER — LIDOCAINE-EPINEPHRINE 2 %-1:100000 IJ SOLN: 30.0000 mL | Freq: Once | INTRAMUSCULAR | Status: DC

## 2020-05-16 MED ORDER — METOPROLOL SUCCINATE ER 50 MG PO TB24
50.0000 mg | ORAL_TABLET | Freq: Every day | ORAL | Status: DC
  Administered 2020-05-16 – 2020-05-17 (×2): 50 mg via ORAL
  Filled 2020-05-16: qty 2
  Filled 2020-05-16: qty 1

## 2020-05-16 MED ORDER — SERTRALINE HCL 100 MG PO TABS
200.0000 mg | ORAL_TABLET | Freq: Every day | ORAL | Status: DC
  Administered 2020-05-16 – 2020-05-22 (×7): 200 mg via ORAL
  Filled 2020-05-16: qty 2
  Filled 2020-05-16: qty 4
  Filled 2020-05-16: qty 2
  Filled 2020-05-16 (×2): qty 4
  Filled 2020-05-16 (×3): qty 2

## 2020-05-16 MED ORDER — SODIUM CHLORIDE 0.9 % IV SOLN
1.0000 g | INTRAVENOUS | Status: DC
  Administered 2020-05-16: 1 g via INTRAVENOUS
  Filled 2020-05-16 (×2): qty 10

## 2020-05-16 MED ORDER — TRANEXAMIC ACID 1000 MG/10ML IV SOLN
500.0000 mg | Freq: Once | INTRAVENOUS | Status: AC
  Administered 2020-05-16: 500 mg via TOPICAL
  Filled 2020-05-16: qty 10

## 2020-05-16 MED ORDER — LIDOCAINE-EPINEPHRINE 1 %-1:100000 IJ SOLN
20.0000 mL | Freq: Once | INTRAMUSCULAR | Status: AC
  Administered 2020-05-16: 20 mL via INTRADERMAL

## 2020-05-16 MED ORDER — DILTIAZEM HCL-DEXTROSE 125-5 MG/125ML-% IV SOLN (PREMIX)
5.0000 mg/h | INTRAVENOUS | Status: DC
Start: 2020-05-16 — End: 2020-05-16
  Administered 2020-05-16: 5 mg/h via INTRAVENOUS
  Filled 2020-05-16: qty 125

## 2020-05-16 MED ORDER — SILVER NITRATE-POT NITRATE 75-25 % EX MISC
1.0000 | Freq: Once | CUTANEOUS | Status: AC
  Administered 2020-05-16: 1 via TOPICAL
  Filled 2020-05-16: qty 10

## 2020-05-16 MED ORDER — HYDRALAZINE HCL 20 MG/ML IJ SOLN
10.0000 mg | INTRAMUSCULAR | Status: DC | PRN
  Administered 2020-05-16: 10 mg via INTRAVENOUS
  Administered 2020-05-16 – 2020-05-17 (×2): 20 mg via INTRAVENOUS
  Filled 2020-05-16 (×3): qty 1

## 2020-05-16 MED ORDER — DILTIAZEM HCL ER COATED BEADS 180 MG PO CP24
360.0000 mg | ORAL_CAPSULE | Freq: Every day | ORAL | Status: DC
  Administered 2020-05-17 – 2020-05-23 (×7): 360 mg via ORAL
  Filled 2020-05-16 (×2): qty 2
  Filled 2020-05-16: qty 1
  Filled 2020-05-16 (×2): qty 2
  Filled 2020-05-16 (×2): qty 1

## 2020-05-16 MED ORDER — LIDOCAINE HCL (PF) 1 % IJ SOLN: 10.0000 mL | Freq: Once | INTRAMUSCULAR | Status: DC

## 2020-05-16 MED ORDER — POTASSIUM CHLORIDE CRYS ER 20 MEQ PO TBCR
40.0000 meq | EXTENDED_RELEASE_TABLET | Freq: Once | ORAL | Status: AC
  Administered 2020-05-16: 40 meq via ORAL
  Filled 2020-05-16: qty 2

## 2020-05-16 MED ORDER — OXYMETAZOLINE HCL 0.05 % NA SOLN
1.0000 | Freq: Once | NASAL | Status: AC
  Administered 2020-05-16: 1 via NASAL
  Filled 2020-05-16: qty 30

## 2020-05-16 MED ORDER — LABETALOL HCL 5 MG/ML IV SOLN
10.0000 mg | Freq: Once | INTRAVENOUS | Status: AC
  Administered 2020-05-16: 10 mg via INTRAVENOUS
  Filled 2020-05-16: qty 4

## 2020-05-16 MED ORDER — FENTANYL CITRATE (PF) 100 MCG/2ML IJ SOLN: 25.0000 ug | Freq: Once | INTRAMUSCULAR | Status: DC

## 2020-05-16 MED ORDER — DILTIAZEM HCL-DEXTROSE 125-5 MG/125ML-% IV SOLN (PREMIX)
5.0000 mg/h | INTRAVENOUS | Status: DC
  Administered 2020-05-16: 5 mg/h via INTRAVENOUS
  Administered 2020-05-17: 10 mg/h via INTRAVENOUS
  Administered 2020-05-17 (×2): 5 mg/h via INTRAVENOUS
  Filled 2020-05-16 (×3): qty 125

## 2020-05-16 MED ORDER — CHLORHEXIDINE GLUCONATE CLOTH 2 % EX PADS
6.0000 | MEDICATED_PAD | Freq: Every day | CUTANEOUS | Status: DC
  Administered 2020-05-16 – 2020-05-20 (×4): 6 via TOPICAL

## 2020-05-16 MED ORDER — DILTIAZEM HCL ER COATED BEADS 360 MG PO CP24
360.0000 mg | ORAL_CAPSULE | Freq: Every day | ORAL | Status: DC
  Administered 2020-05-16: 360 mg via ORAL
  Filled 2020-05-16: qty 1

## 2020-05-16 NOTE — ED Notes (Signed)
ED Provider at bedside. 

## 2020-05-16 NOTE — ED Notes (Addendum)
Pt urinated on the bed, pt aware that she did. Pt seemed more confused now, but follows command. Pt oriented to self, pt and place. BP remains high, MAP is high. Notified Dr. Tamala Julian.  Pt changed, purewick placed. Coban placed on IV site.

## 2020-05-16 NOTE — Progress Notes (Signed)
Patient's bleeding has completely stopped with the Epistat catheter in place on the right side.  BP still elevated at >160/90   Recommendations (relayed to RN):  1.  Control BP  2.  Anticoagulants managed by ICU MD/cardiology - reverse if rebleeds and drops counts  3.  Cardiac telemetry while Epistat in place due to concerns for nasopulmonary reflex - heightened due to her untreated complex sleep apnea.  4.  Catheter in place x 2 days.  Will remove Thursday morning.  If rebleeds: will replace, reverse Xarelto, and consider taking to OR.  No role for embolization due to high septal source of bleeding (ethmoidal artery source).

## 2020-05-16 NOTE — H&P (Addendum)
NAME:  Beth Haynes, MRN:  [id number], DOB:  1945/02/28, LOS: 0 ADMISSION DATE:  05/16/2020, CONSULTATION DATE:  05/16/20 REFERRING MD:  Dina Rich CHIEF COMPLAINT:  Epistaxis   History of Present Illness:  Beth Haynes is a 75 y.o. female who presented to [facility] 4/5 with right sided epistaxis.  Multiple attempts to control bleeding failed including cautery and packing; therefore, she was transferred to [facility] for further evaluation by ENT.  She is on Xarelto for A.fib after failing previous cardioversion.  After arrival to [facility], she was seen by Dr. Marcelline Deist of ENT who removed packing and applied Lidocaine and Afrin before attempting silver nitrate which was not successful.  He subsequently placed an Epistat catheter with 5cc in posterior balloon and 12cc in anterior balloon.  Bleeding was controlled thereafter.  Of note she denies headache, high blood pressure, chest paint, SOB prior to nose bleed Denies CP, SOB, light headedness, and dizziness during the nose bleed and currently.   ICU admission was recommended for observation overnight.  Pertinent  Medical History:  has OSA (obstructive sleep apnea); HTN (hypertension); Obesity (BMI 30-39.9); Obstructive sleep apnea hypopnea, severe; Depression; Chronic diastolic CHF (congestive heart failure) ([place]); Persistent atrial fibrillation ([place]); Paroxysmal atrial fibrillation ([place]); Intolerance of continuous positive airway pressure (CPAP) ventilation; Complex sleep apnea syndrome; Intracerebral hemorrhage; Anxiety; Hyperlipidemia; Right-sided epistaxis; and Epistaxis on their problem list.  Significant Hospital Events: Including procedures, antibiotic start and stop dates in addition to other pertinent events   4/5: admit, nasal Epistat placed by ENT.  Interim History / Subjective:    Objective:  Blood pressure (!) 161/97, pulse (!) 54, temperature 97.8 F (36.6 C), temperature source Oral, resp. rate 12, height 5\' 3"  (1.6 m), weight 70.3 kg, SpO2 98 %.        No intake or output data in the 24 hours ending 05/16/20 1130 Filed Weights   05/16/20 0148  Weight: 70.3 kg    Examination: General: elderly female in NAD Neuro: Alert, oriented, non-focal. Tearful  HEENT: Nasal packing/epistat in place R nare. Dried blood on face and neck. No active bleeding seen.  Cardiovascular: Tachy, IRIR, no MRG Lungs: Clear throughout all fields.  Abdomen: Soft, non-tender, non-distended Musculoskeletal: No acute deformity or ROM limitation. Trace pedal edema.  Skin: Grossly intact. MMM.    Resolved Hospital Problem list:    Assessment & Plan:   Epistaxis - exacerbated by hypertension and Xarelto.  Failed packing and cautery attemps and ultimately required Epistat placement by ENT. - ENT following. - Admit to ICU for the day for close observation. - Per EDP notes, Epistat to be removed Thursday with re-evaluation (ENT to ultimately decide / dictate timing of this). - If bleeding persists, will require reversal of Xarelto. - Anti HTNsive management as below.   Hypertension Hx A.fib on Xarelto. - Currently on diltiazem infusion - Restart diltiazem, metoprolol, losartan per home regimen.  - DC dilt drip - Hold Xarelto in setting epistaxis.  Hx OSA - non-compliant with CPAP - Outpatient follow up  Hypokalemia. - 40 mEq K now - Follow BMP  Crohn's disease - holding budesonide until bleeding improved.    Plan to monitor in ICU overnight given airway concerns.   Best practice (evaluated daily):  Diet:  Oral Pain/Anxiety/Delirium protocol (if indicated): No VAP protocol (if indicated): Not indicated DVT prophylaxis: SCD GI prophylaxis: N/A Glucose control:  SSI No Central venous access:  N/A Arterial line:  N/A Foley:  N/A Mobility:  bed rest  PT  consulted: N/A Last date of multidisciplinary goals of care discussion  Code Status:  full code Disposition: ICU  Labs   CBC: Recent Labs  Lab 05/16/20 0200 05/16/20 0810  WBC 6.0 6.9   NEUTROABS  --  4.6  HGB 15.4* 14.3  HCT 46.2* 43.5  MCV 90.1 92.2  PLT 234 211    Basic Metabolic Panel: Recent Labs  Lab 05/16/20 0810  NA 141  K 3.4*  CL 110  CO2 25  GLUCOSE 116*  BUN 19  CREATININE 0.76  CALCIUM 9.4   GFR: Estimated Creatinine Clearance: 57.2 mL/min (by C-G formula based on SCr of 0.76 mg/dL). Recent Labs  Lab 05/16/20 0200 05/16/20 0810  WBC 6.0 6.9    Liver Function Tests: No results for input(s): AST, ALT, ALKPHOS, BILITOT, PROT, ALBUMIN in the last 168 hours. No results for input(s): LIPASE, AMYLASE in the last 168 hours. No results for input(s): AMMONIA in the last 168 hours.  ABG    Component Value Date/Time   TCO2 29 10/24/2017 1153     Coagulation Profile: Recent Labs  Lab 05/16/20 0810  INR 2.6*    Cardiac Enzymes: No results for input(s): CKTOTAL, CKMB, CKMBINDEX, TROPONINI in the last 168 hours.  HbA1C: Hgb A1c MFr Bld  Date/Time Value Ref Range Status  02/21/2017 08:36 AM 5.2 4.8 - 5.6 % Final    Comment:    (NOTE) Pre diabetes:          5.7%-6.4% Diabetes:              >6.4% Glycemic control for   <7.0% adults with diabetes     CBG: No results for input(s): GLUCAP in the last 168 hours.  Review of Systems:   Bolds are positive  Constitutional: weight loss, gain, night sweats, Fevers, chills, fatigue .  HEENT: headaches, Sore throat, sneezing, nasal congestion, epistaxis, Difficulty swallowing, Tooth/dental problems, visual complaints visual changes, ear ache CV:  chest pain, radiates:,Orthopnea, PND, swelling in lower extremities, dizziness, palpitations, syncope.  GI  heartburn, indigestion, abdominal pain, nausea, vomiting, diarrhea, change in bowel habits, loss of appetite, bloody stools.  Resp: cough, productive: , hemoptysis, dyspnea, chest pain, pleuritic.  Skin: rash or itching or icterus GU: dysuria, change in color of urine, urgency or frequency. flank pain, hematuria  MS: joint pain or swelling.  decreased range of motion  Psych: change in mood or affect. depression or anxiety.  Neuro: difficulty with speech, weakness, numbness, ataxia    Past Medical History:  She,  has a past medical history of Atrial fibrillation ([place]), Basal cell carcinoma (07/23/2018), IBS (irritable bowel syndrome), Obesity (BMI 30-39.9) (07/11/2015), OSA (obstructive sleep apnea) (01/17/2015), Panic attacks, and Urinary incontinence, nocturnal enuresis.   Surgical History:   Past Surgical History:  Procedure Laterality Date  . CARDIOVERSION N/A 04/02/2017   Procedure: CARDIOVERSION;  Surgeon: Skeet Latch, MD;  Location: [place];  Service: Cardiovascular;  Laterality: N/A;  . SKIN BIOPSY N/A 07/23/2018   mid upper back-moderate-0 tmt  . TONSILLECTOMY       Social History:   reports that she has never smoked. She has never used smokeless tobacco. She reports that she does not drink alcohol and does not use drugs.   Family History:  Her family history includes Breast cancer in her maternal aunt; Heart attack in her father and paternal grandfather; Heart failure in her mother; Hypertension in her maternal grandmother.   Allergies Allergies  Allergen Reactions  . Sulfa Antibiotics Other (See Comments)  Childhood allergy   . Codeine     Other reaction(s): Unknown  . Epinephrine Other (See Comments)    Panic attacks      Home Medications  Prior to Admission medications   Medication Sig Start Date End Date Taking? Authorizing Provider  budesonide (ENTOCORT EC) 3 MG 24 hr capsule Take 9 mg by mouth every morning. 10/05/19  Yes [provider]  Cholecalciferol 1.25 MG (50000 UT) TABS Take 50,000 Units by mouth every Tuesday.   Yes [provider]  diltiazem (CARDIZEM CD) 360 MG 24 hr capsule TAKE 1 CAPSULE BY MOUTH DAILY. Patient taking differently: Take 360 mg by mouth daily. 04/04/20  Yes Nahser, Wonda Cheng, MD  losartan (COZAAR) 50 MG tablet Take 50 mg by mouth daily.  Reported on 07/20/2015 07/03/15  Yes [provider]  metoprolol succinate (TOPROL-XL) 50 MG 24 hr tablet Take 1 tablet (50 mg total) by mouth daily. Take with or immediately following a meal. 01/10/20  Yes Nahser, Wonda Cheng, MD  mirabegron ER (MYRBETRIQ) 25 MG TB24 tablet Take 25 mg by mouth daily.   Yes [provider]  rivaroxaban (XARELTO) 20 MG TABS tablet TAKE ONE TABLET BY MOUTH DAILY WITH SUPPER Patient taking differently: Take 20 mg by mouth daily with supper. 12/28/19  Yes Nahser, Wonda Cheng, MD  sertraline (ZOLOFT) 100 MG tablet Take 200 mg by mouth at bedtime.    Yes [provider]  ciclopirox (PENLAC) 8 % solution Apply topically at bedtime. Apply over nail and surrounding skin. Apply daily over previous coat. After seven (7) days, may remove with alcohol and continue cycle. Patient not taking: No sig reported 04/26/19   Trula Slade, DPM     Critical care time: N/A     Georgann Housekeeper, AGACNP-BC [facility]  See Amion for personal pager PCCM on call pager [phone] until 7pm. Please call Elink 7p-7a. [phone]  05/16/2020 11:30 AM

## 2020-05-16 NOTE — H&P (Deleted)
NAME:  [name], MRN:  [id number], DOB:  Jul 21, 1945, LOS: 0 ADMISSION DATE:  05/16/2020, CONSULTATION DATE:  05/16/20 REFERRING MD:  Dina Rich CHIEF COMPLAINT:  Epistaxis   History of Present Illness:  Beth Haynes is a 75 y.o. female who presented to [facility] 4/5 with right sided epistaxis.  Multiple attempts to control bleeding failed including cautery and packing; therefore, she was transferred to [facility] for further evaluation by ENT.  She is on Xarelto for A.fib after failing previous cardioversion.  After arrival to [facility], she was seen by Dr. Marcelline Deist of ENT who removed packing and applied Lidocaine and Afrin before attempting silver nitrate which was not successful.  He subsequently placed an Epistat catheter with 5cc in posterior balloon and 12cc in anterior balloon.  Bleeding was controlled thereafter.  Of note she denies headache, high blood pressure, chest paint, SOB prior to nose bleed Denies CP, SOB, light headedness, and dizziness during the nose bleed and currently.   ICU admission was recommended for observation overnight.  Pertinent  Medical History:  has OSA (obstructive sleep apnea); HTN (hypertension); Obesity (BMI 30-39.9); Obstructive sleep apnea hypopnea, severe; Depression; Chronic diastolic CHF (congestive heart failure) ([place]); Persistent atrial fibrillation ([place]); Paroxysmal atrial fibrillation ([place]); Intolerance of continuous positive airway pressure (CPAP) ventilation; Complex sleep apnea syndrome; Intracerebral hemorrhage; Anxiety; Hyperlipidemia; and Right-sided epistaxis on their problem list.  Significant Hospital Events: Including procedures, antibiotic start and stop dates in addition to other pertinent events   4/5: admit, nasal Epistat placed by ENT.  Interim History / Subjective:    Objective:  Blood pressure (!) 161/92, pulse (!) 105, temperature 97.8 F (36.6 C), temperature source Oral, resp. rate 12, height 5\' 3"  (1.6 m), weight 70.3 kg, SpO2 97 %.       No  intake or output data in the 24 hours ending 05/16/20 1027 Filed Weights   05/16/20 0148  Weight: 70.3 kg    Examination: General: elderly female in NAD Neuro: Alert, oriented, non-focal. Tearful  HEENT: Nasal packing/epistat in place R nare. Dried blood on face and neck. No active bleeding seen.  Cardiovascular: Tachy, IRIR, no MRG Lungs: Clear throughout all fields.  Abdomen: Soft, non-tender, non-distended Musculoskeletal: No acute deformity or ROM limitation. Trace pedal edema.  Skin: Grossly intact. MMM.    Resolved Hospital Problem list:    Assessment & Plan:   Epistaxis - exacerbated by hypertension and Xarelto.  Failed packing and cautery attemps and ultimately required Epistat placement by ENT. - ENT following. - Admit to ICU for the day for close observation. - Per EDP notes, Epistat to be removed Thursday with re-evaluation (ENT to ultimately decide / dictate timing of this). - If bleeding persists, will require reversal of Xarelto. - Anti HTNsive management as below.   Hypertension Hx A.fib on Xarelto. - Currently on diltiazem infusion - Restart diltiazem, metoprolol, losartan per home regimen.  - Hold Xarelto in setting epistaxis.  Hx OSA - non-compliant with CPAP - Outpatient follow up  Hypokalemia. - 40 mEq K now - Follow BMP  Plan to monitor in ICU overnight given airway concerns.   Best practice (evaluated daily):  Diet:  Oral Pain/Anxiety/Delirium protocol (if indicated): No VAP protocol (if indicated): Not indicated DVT prophylaxis: SCD GI prophylaxis: N/A Glucose control:  SSI No Central venous access:  N/A Arterial line:  N/A Foley:  N/A Mobility:  bed rest  PT consulted: N/A Last date of multidisciplinary goals of care discussion  Code Status:  full code  Disposition: ICU  Labs   CBC: Recent Labs  Lab 05/16/20 0200 05/16/20 0810  WBC 6.0 6.9  NEUTROABS  --  4.6  HGB 15.4* 14.3  HCT 46.2* 43.5  MCV 90.1 92.2  PLT 234 216     Basic Metabolic Panel: Recent Labs  Lab 05/16/20 0810  NA 141  K 3.4*  CL 110  CO2 25  GLUCOSE 116*  BUN 19  CREATININE 0.76  CALCIUM 9.4   GFR: Estimated Creatinine Clearance: 57.2 mL/min (by C-G formula based on SCr of 0.76 mg/dL). Recent Labs  Lab 05/16/20 0200 05/16/20 0810  WBC 6.0 6.9    Liver Function Tests: No results for input(s): AST, ALT, ALKPHOS, BILITOT, PROT, ALBUMIN in the last 168 hours. No results for input(s): LIPASE, AMYLASE in the last 168 hours. No results for input(s): AMMONIA in the last 168 hours.  ABG    Component Value Date/Time   TCO2 29 10/24/2017 1153     Coagulation Profile: Recent Labs  Lab 05/16/20 0810  INR 2.6*    Cardiac Enzymes: No results for input(s): CKTOTAL, CKMB, CKMBINDEX, TROPONINI in the last 168 hours.  HbA1C: Hgb A1c MFr Bld  Date/Time Value Ref Range Status  02/21/2017 08:36 AM 5.2 4.8 - 5.6 % Final    Comment:    (NOTE) Pre diabetes:          5.7%-6.4% Diabetes:              >6.4% Glycemic control for   <7.0% adults with diabetes     CBG: No results for input(s): GLUCAP in the last 168 hours.  Review of Systems:   Bolds are positive  Constitutional: weight loss, gain, night sweats, Fevers, chills, fatigue .  HEENT: headaches, Sore throat, sneezing, nasal congestion, epistaxis, Difficulty swallowing, Tooth/dental problems, visual complaints visual changes, ear ache CV:  chest pain, radiates:,Orthopnea, PND, swelling in lower extremities, dizziness, palpitations, syncope.  GI  heartburn, indigestion, abdominal pain, nausea, vomiting, diarrhea, change in bowel habits, loss of appetite, bloody stools.  Resp: cough, productive: , hemoptysis, dyspnea, chest pain, pleuritic.  Skin: rash or itching or icterus GU: dysuria, change in color of urine, urgency or frequency. flank pain, hematuria  MS: joint pain or swelling. decreased range of motion  Psych: change in mood or affect. depression or anxiety.   Neuro: difficulty with speech, weakness, numbness, ataxia    Past Medical History:  She,  has a past medical history of Atrial fibrillation ([place]), Basal cell carcinoma (07/23/2018), IBS (irritable bowel syndrome), Obesity (BMI 30-39.9) (07/11/2015), OSA (obstructive sleep apnea) (01/17/2015), Panic attacks, and Urinary incontinence, nocturnal enuresis.   Surgical History:   Past Surgical History:  Procedure Laterality Date  . CARDIOVERSION N/A 04/02/2017   Procedure: CARDIOVERSION;  Surgeon: Skeet Latch, MD;  Location: [place];  Service: Cardiovascular;  Laterality: N/A;  . SKIN BIOPSY N/A 07/23/2018   mid upper back-moderate-0 tmt  . TONSILLECTOMY       Social History:   reports that she has never smoked. She has never used smokeless tobacco. She reports that she does not drink alcohol and does not use drugs.   Family History:  Her family history includes Breast cancer in her maternal aunt; Heart attack in her father and paternal grandfather; Heart failure in her mother; Hypertension in her maternal grandmother.   Allergies Allergies  Allergen Reactions  . Sulfa Antibiotics Other (See Comments)    Childhood allergy   . Codeine     Other reaction(s): Unknown  .  Epinephrine Other (See Comments)    Panic attacks      Home Medications  Prior to Admission medications   Medication Sig Start Date End Date Taking? Authorizing Provider  budesonide (ENTOCORT EC) 3 MG 24 hr capsule Take 9 mg by mouth every morning. 10/05/19  Yes [provider]  Cholecalciferol 1.25 MG (50000 UT) TABS Take 50,000 Units by mouth every Tuesday.   Yes [provider]  diltiazem (CARDIZEM CD) 360 MG 24 hr capsule TAKE 1 CAPSULE BY MOUTH DAILY. Patient taking differently: Take 360 mg by mouth daily. 04/04/20  Yes Nahser, Wonda Cheng, MD  losartan (COZAAR) 50 MG tablet Take 50 mg by mouth daily. Reported on 07/20/2015 07/03/15  Yes [provider]  metoprolol succinate  (TOPROL-XL) 50 MG 24 hr tablet Take 1 tablet (50 mg total) by mouth daily. Take with or immediately following a meal. 01/10/20  Yes Nahser, Wonda Cheng, MD  mirabegron ER (MYRBETRIQ) 25 MG TB24 tablet Take 25 mg by mouth daily.   Yes [provider]  rivaroxaban (XARELTO) 20 MG TABS tablet TAKE ONE TABLET BY MOUTH DAILY WITH SUPPER Patient taking differently: Take 20 mg by mouth daily with supper. 12/28/19  Yes Nahser, Wonda Cheng, MD  sertraline (ZOLOFT) 100 MG tablet Take 200 mg by mouth at bedtime.    Yes [provider]  ciclopirox (PENLAC) 8 % solution Apply topically at bedtime. Apply over nail and surrounding skin. Apply daily over previous coat. After seven (7) days, may remove with alcohol and continue cycle. Patient not taking: No sig reported 04/26/19   Trula Slade, DPM     Critical care time: N/A     Georgann Housekeeper, AGACNP-BC [facility]  See Amion for personal pager PCCM on call pager [phone] until 7pm. Please call Elink 7p-7a. [phone]  05/16/2020 11:27 AM

## 2020-05-16 NOTE — ED Notes (Signed)
NA x 1

## 2020-05-16 NOTE — ED Notes (Signed)
Pt very anxious and restless. Heart rate and blood pressure keeps going up. Lorazepam given.

## 2020-05-16 NOTE — ED Provider Notes (Signed)
[facility] EMERGENCY DEPARTMENT Provider Note   CSN: [id number] Arrival date & time: 05/16/20  0140     History Chief Complaint  Patient presents with  . Epistaxis    Beth Haynes is a 75 y.o. female.  HPI   75 year old female presents the emergency department as a transfer for right-sided epistaxis in setting of anticoagulation.  Patient is on Xarelto for paroxysmal atrial fibrillation.  She originally presented to [facility] with epistaxis, multiple attempts were made to control the bleeding including cauterization and packing.  Patient continued to bleed around the packing so was sent here for ENT consultation, Dr. Marcelline Deist was consulted and aware of her arrival.  On arrival the patient's nose continues to bleed, bright red bleeding anteriorly.  Past Medical History:  Diagnosis Date  . Atrial fibrillation ([place])   . Basal cell carcinoma 07/23/2018   right cheekbone-CX35FU  . IBS (irritable bowel syndrome)   . Obesity (BMI 30-39.9) 07/11/2015  . OSA (obstructive sleep apnea) 01/17/2015   Severe with AHI 35.8/hr  . Panic attacks   . Urinary incontinence, nocturnal enuresis     Patient Active Problem List   Diagnosis Date Noted  . Right-sided epistaxis   . Anxiety 04/26/2019  . Hyperlipidemia 04/26/2019  . 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 Paroxysmal atrial fibrillation ([place]) 05/06/2017  . Intolerance of continuous positive airway pressure (CPAP) ventilation 05/06/2017  . 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 Persistent atrial fibrillation ([place]) 02/22/2017  . Depression 02/20/2017  . Chronic diastolic CHF (congestive heart failure) ([place]) 02/20/2017  . Obstructive sleep apnea hypopnea, severe 06/12/2016  . Obesity (BMI 30-39.9) 07/11/2015  . OSA (obstructive sleep apnea) 01/17/2015  . HTN (hypertension) 01/17/2015    Past Surgical History:  Procedure Laterality Date  . CARDIOVERSION N/A 04/02/2017   Procedure: CARDIOVERSION;   Surgeon: Skeet Latch, MD;  Location: [place];  Service: Cardiovascular;  Laterality: N/A;  . SKIN BIOPSY N/A 07/23/2018   mid upper back-moderate-0 tmt  . TONSILLECTOMY       OB History   No obstetric history on file.     Family History  Problem Relation Age of Onset  . Heart failure Mother   . Heart attack Father   . Hypertension Maternal Grandmother   . Heart attack Paternal Grandfather   . Breast cancer Maternal Aunt     Social History   Tobacco Use  . Smoking status: Never Smoker  . Smokeless tobacco: Never Used  Vaping Use  . Vaping Use: Never used  Substance Use Topics  . Alcohol use: No  . Drug use: No    Home Medications Prior to Admission medications   Medication Sig Start Date End Date Taking? Authorizing Provider  budesonide (ENTOCORT EC) 3 MG 24 hr capsule Take 9 mg by mouth every morning. 10/05/19  Yes [provider]  Cholecalciferol 1.25 MG (50000 UT) TABS Take 50,000 Units by mouth every Tuesday.   Yes [provider]  diltiazem (CARDIZEM CD) 360 MG 24 hr capsule TAKE 1 CAPSULE BY MOUTH DAILY. Patient taking differently: Take 360 mg by mouth daily. 04/04/20  Yes Nahser, Wonda Cheng, MD  losartan (COZAAR) 50 MG tablet Take 50 mg by mouth daily. Reported on 07/20/2015 07/03/15  Yes [provider]  metoprolol succinate (TOPROL-XL) 50 MG 24 hr tablet Take 1 tablet (50 mg total) by mouth daily. Take with or immediately following a meal. 01/10/20  Yes Nahser, Wonda Cheng, MD  mirabegron ER [facility]) 25  MG TB24 tablet Take 25 mg by mouth daily.   Yes [provider]  rivaroxaban (XARELTO) 20 MG TABS tablet TAKE ONE TABLET BY MOUTH DAILY WITH SUPPER Patient taking differently: Take 20 mg by mouth daily with supper. 12/28/19  Yes Nahser, Wonda Cheng, MD  sertraline (ZOLOFT) 100 MG tablet Take 200 mg by mouth at bedtime.    Yes [provider]  ciclopirox (PENLAC) 8 % solution Apply topically at bedtime. Apply over nail  and surrounding skin. Apply daily over previous coat. After seven (7) days, may remove with alcohol and continue cycle. Patient not taking: No sig reported 04/26/19   Trula Slade, DPM    Allergies    Sulfa antibiotics, Codeine, and Epinephrine  Review of Systems   Review of Systems  Constitutional: Positive for fatigue. Negative for chills and fever.  HENT: Positive for nosebleeds, postnasal drip and sinus pain. Negative for congestion.   Eyes: Negative for visual disturbance.  Respiratory: Negative for shortness of breath.   Cardiovascular: Negative for chest pain.  Gastrointestinal: Negative for abdominal pain, diarrhea and vomiting.  Genitourinary: Negative for dysuria.  Skin: Negative for rash.  Neurological: Negative for headaches.    Physical Exam Updated Vital Signs BP (!) 161/92   Pulse (!) 105   Temp 97.8 F (36.6 C) (Oral)   Resp 12   Ht 5\' 3"  (1.6 m)   Wt 70.3 kg   SpO2 97%   BMI 27.46 kg/m   Physical Exam Vitals and nursing note reviewed.  Constitutional:      Appearance: Normal appearance.     Comments: Covered in dried red blood  HENT:     Head: Normocephalic.     Nose:     Comments: Packing in place in the right nares, bright red blood dripping anteriorly    Mouth/Throat:     Mouth: Mucous membranes are moist.  Cardiovascular:     Rate and Rhythm: Tachycardia present.  Pulmonary:     Effort: Pulmonary effort is normal. No respiratory distress.  Abdominal:     Palpations: Abdomen is soft.  Skin:    General: Skin is warm.  Neurological:     Mental Status: She is alert and oriented to person, place, and time. Mental status is at baseline.  Psychiatric:        Mood and Affect: Mood normal.     ED Results / Procedures / Treatments   Labs (all labs ordered are listed, but only abnormal results are displayed) Labs Reviewed  CBC - Abnormal; Notable for the following components:      Result Value   RBC 5.13 (*)    Hemoglobin 15.4 (*)     HCT 46.2 (*)    All other components within normal limits  BASIC METABOLIC PANEL - Abnormal; Notable for the following components:   Potassium 3.4 (*)    Glucose, Bld 116 (*)    All other components within normal limits  PROTIME-INR - Abnormal; Notable for the following components:   Prothrombin Time 27.3 (*)    INR 2.6 (*)    All other components within normal limits  CBC WITH DIFFERENTIAL/PLATELET    EKG None  Radiology No results found.  Procedures Procedures   Medications Ordered in ED Medications  fentaNYL (SUBLIMAZE) injection 25 mcg (has no administration in time range)  diltiazem (CARDIZEM) 125 mg in dextrose 5% 125 mL (1 mg/mL) infusion (5 mg/hr Intravenous New Bag/Given 05/16/20 0914)  LORazepam (ATIVAN) injection 1 mg (has no  administration in time range)  tranexamic acid (CYKLOKAPRON) injection 500 mg (500 mg Topical Given 05/16/20 0247)  tranexamic acid (CYKLOKAPRON) injection 500 mg (500 mg Topical Given 05/16/20 0533)  oxymetazoline (AFRIN) 0.05 % nasal spray 1 spray (1 spray Each Nare Given 05/16/20 0532)  silver nitrate applicators applicator 1 Stick (1 Stick Topical Given 05/16/20 0532)  lidocaine-EPINEPHrine (XYLOCAINE W/EPI) 1 %-1:100000 (with pres) injection 20 mL (20 mLs Intradermal Given 05/16/20 0531)  labetalol (NORMODYNE) injection 10 mg (10 mg Intravenous Given 05/16/20 0758)    ED Course  I have reviewed the triage vital signs and the nursing notes.  Pertinent labs & imaging results that were available during my care of the patient were reviewed by me and considered in my medical decision making (see chart for details).    MDM Rules/Calculators/A&P                          75 year old female presents the emergency department ongoing right epistaxis despite cauterization and packing by ER physician at outside facility.  Dr. Marcelline Deist called on patient's arrival.  Request for blood pressure control, patient put on Cardizem drip with good blood pressure control.   ENT performed bedside procedure, ultimately placed and epi stat.  They recommend ICU admission, epi stat removal on Thursday with reevaluation.  If the patient bleeds around the epi stat they recommend reversal of Xarelto.  Currently vitals are stable, repeat hemoglobin appears stable.  ICU has been consulted.  I have made multiple attempts to contact the patient's husband Nadara Mustard, unsuccessful at this time.  Patients evaluation and results requires admission for further treatment and care. Patient agrees with admission plan, offers no new complaints and is stable/unchanged at time of admit.  Final Clinical Impression(s) / ED Diagnoses Final diagnoses:  Right-sided epistaxis    Rx / DC Orders ED Discharge Orders    None       Lorelle Gibbs, DO 05/16/20 1024

## 2020-05-16 NOTE — ED Notes (Signed)
[facility] at bedside to transport patient to [facility] ED. No acute distress noted.

## 2020-05-16 NOTE — Progress Notes (Signed)
No further bleeding noted  Recheck in AM

## 2020-05-16 NOTE — ED Notes (Signed)
Report given to [facility]  

## 2020-05-16 NOTE — Consult Note (Signed)
Reason for Consult: Epistaxis Referring Physician: ER physician  Beth Haynes is an 75 y.o. female.  HPI: This is a patient who was transferred in from an outside emergency room with intractable right-sided epistaxis.  The patient is on anticoagulants for atrial fibrillation having failed previous cardioversion.  She has complex sleep apnea but uses not treated.  She has problems with anxiety and panic attacks.  On presentation to the ER in [place], her blood pressure was 180/100.  She had a Merocel in the right naris with active bleeding.  Her hemoglobin is stable at 14.3 (15.0 in [facility]).  Past Medical History:  Diagnosis Date  . Atrial fibrillation ([place])   . Basal cell carcinoma 07/23/2018   right cheekbone-CX35FU  . IBS (irritable bowel syndrome)   . Obesity (BMI 30-39.9) 07/11/2015  . OSA (obstructive sleep apnea) 01/17/2015   Severe with AHI 35.8/hr  . Panic attacks   . Urinary incontinence, nocturnal enuresis     Past Surgical History:  Procedure Laterality Date  . CARDIOVERSION N/A 04/02/2017   Procedure: CARDIOVERSION;  Surgeon: Skeet Latch, MD;  Location: [place];  Service: Cardiovascular;  Laterality: N/A;  . SKIN BIOPSY N/A 07/23/2018   mid upper back-moderate-0 tmt  . TONSILLECTOMY      Family History  Problem Relation Age of Onset  . Heart failure Mother   . Heart attack Father   . Hypertension Maternal Grandmother   . Heart attack Paternal Grandfather   . Breast cancer Maternal Aunt     Social History:  reports that she has never smoked. She has never used smokeless tobacco. She reports that she does not drink alcohol and does not use drugs.  Allergies:  Allergies  Allergen Reactions  . Sulfa Antibiotics Other (See Comments)    Childhood allergy   . Codeine     Other reaction(s): Unknown  . Epinephrine Other (See Comments)    Panic attacks     Medications: I have reviewed the patient's current medications.  Results for orders placed or  performed during the hospital encounter of 05/16/20 (from the past 48 hour(s))  CBC     Status: Abnormal   Collection Time: 05/16/20  2:00 AM  Result Value Ref Range   WBC 6.0 4.0 - 10.5 K/uL   RBC 5.13 (H) 3.87 - 5.11 MIL/uL   Hemoglobin 15.4 (H) 12.0 - 15.0 g/dL   HCT 46.2 (H) 36.0 - 46.0 %   MCV 90.1 80.0 - 100.0 fL   MCH 30.0 26.0 - 34.0 pg   MCHC 33.3 30.0 - 36.0 g/dL   RDW 15.3 11.5 - 15.5 %   Platelets 234 150 - 400 K/uL   nRBC 0.0 0.0 - 0.2 %    Comment: Performed at [facility], [address] [place], [place] [address]  Basic metabolic panel     Status: Abnormal   Collection Time: 05/16/20  8:10 AM  Result Value Ref Range   Sodium 141 135 - 145 mmol/L   Potassium 3.4 (L) 3.5 - 5.1 mmol/L   Chloride 110 98 - 111 mmol/L   CO2 25 22 - 32 mmol/L   Glucose, Bld 116 (H) 70 - 99 mg/dL    Comment: Glucose reference range applies only to samples taken after fasting for at least 8 hours.   BUN 19 8 - 23 mg/dL   Creatinine, Ser 0.76 0.44 - 1.00 mg/dL   Calcium 9.4 8.9 - 10.3 mg/dL   GFR, Estimated >60 >60  mL/min    Comment: (NOTE) Calculated using the CKD-EPI Creatinine Equation (2021)    Anion gap 6 5 - 15    Comment: Performed at [facility], [place] [address], [place], [place] [address]  Protime-INR     Status: Abnormal   Collection Time: 05/16/20  8:10 AM  Result Value Ref Range   Prothrombin Time 27.3 (H) 11.4 - 15.2 seconds   INR 2.6 (H) 0.8 - 1.2    Comment: (NOTE) INR goal varies based on device and disease states. Performed at [facility], [place] [address] [place], [place] [address]   CBC with Differential     Status: None   Collection Time: 05/16/20  8:10 AM  Result Value Ref Range   WBC 6.9 4.0 - 10.5 K/uL   RBC 4.72 3.87 - 5.11 MIL/uL   Hemoglobin 14.3 12.0 - 15.0 g/dL   HCT 43.5 36.0 - 46.0 %   MCV 92.2 80.0 - 100.0 fL   MCH 30.3 26.0 - 34.0 pg   MCHC 32.9 30.0 - 36.0 g/dL   RDW 15.1 11.5 - 15.5 %   Platelets 216 150 - 400 K/uL    nRBC 0.0 0.0 - 0.2 %   Neutrophils Relative % 66 %   Neutro Abs 4.6 1.7 - 7.7 K/uL   Lymphocytes Relative 21 %   Lymphs Abs 1.4 0.7 - 4.0 K/uL   Monocytes Relative 9 %   Monocytes Absolute 0.6 0.1 - 1.0 K/uL   Eosinophils Relative 2 %   Eosinophils Absolute 0.2 0.0 - 0.5 K/uL   Basophils Relative 1 %   Basophils Absolute 0.1 0.0 - 0.1 K/uL   Immature Granulocytes 1 %   Abs Immature Granulocytes 0.04 0.00 - 0.07 K/uL    Comment: Performed at [facility], [address] [place], [place] [address]    No results found.  Review of Systems Blood pressure (!) 161/92, pulse (!) 105, temperature 97.8 F (36.6 C), temperature source Oral, resp. rate 12, height 5\' 3"  (1.6 m), weight 70.3 kg, SpO2 97 %. Physical Exam  Anxious appearing female with blood covering her face and chest and active bleeding right nose Pupils equal round and reactive to light/gaze conjugate Cranial nerves intact/no motor or sensory deficits noted Normal tongue mobility.  Lips normal. Without cervical adenopathy or salivary gland masses Chest symmetric expansions bilaterally without use of accessory muscles Pinna normal without mastoid tenderness Face atraumatic without bony step-offs or asymmetries Skin without suspicious lesions on the face or neck.  No rashes.   Assessment/Plan:  Epistaxis  External nose is unremarkable.  I removed the pack from the right naris noting significant excoriation on the right anterior septum.  I applied lidocaine and Afrin via cotton to slow bleeding.  Attempts at silver nitrate were not successful. I placed an Epistat catheter into the right side, placing 5 cc in the posterior balloon and 12 cc in the anterior balloon.  Bleeding stopped.    Requires admission to ICU for management.    Beth Haynes 05/16/2020, 9:56 AM

## 2020-05-16 NOTE — ED Notes (Signed)
Report given to [place] at [facility] ED.

## 2020-05-16 NOTE — ED Notes (Signed)
Pt on afib RVR, Dr. Tamala Julian aware. Pt has a bed ready in ICU. Dr. Tamala Julian wants to transport pt now. No PRN meds ordered.

## 2020-05-16 NOTE — ED Triage Notes (Signed)
Pt presents with complaints of a nosebleed that started approx 2 hours ago. Denies any trauma to nose. Pt is on xarelto.

## 2020-05-16 NOTE — ED Provider Notes (Signed)
[facility]ERGENCY DEPARTMENT Provider Note   CSN: [id number] Arrival date & time: 05/16/20  0140     History Chief Complaint  Patient presents with  . Epistaxis    Beth Haynes is a 75 y.o. female.  Patient with a history of atrial fibrillation on Xarelto presenting with nosebleed.  This occurred about 2 hours ago while she was walking to the bathroom.  Denies any fall or trauma.  Active bleeding from her right nare with blood going down her oropharynx.  Denies any dizziness or lightheadedness.  Denies any chest pain or shortness of breath.  Denies having a nosebleed is significant in the past  The history is provided by the patient.  Epistaxis Associated symptoms: no cough, no dizziness, no fever and no headaches        Past Medical History:  Diagnosis Date  . Atrial fibrillation ([place])   . Basal cell carcinoma 07/23/2018   right cheekbone-[id number]  . IBS (irritable bowel syndrome)   . Obesity (BMI 30-39.9) 07/11/2015  . OSA (obstructive sleep apnea) 01/17/2015   Severe with AHI 35.8/hr  . Panic attacks   . Urinary incontinence, nocturnal enuresis     Patient Active Problem List   Diagnosis Date Noted  . Anxiety 04/26/2019  . Hyperlipidemia 04/26/2019  . 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 Paroxysmal atrial fibrillation ([place]) 05/06/2017  . Intolerance of continuous positive airway pressure (CPAP) ventilation 05/06/2017  . 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 Persistent atrial fibrillation ([place]) 02/22/2017  . Depression 02/20/2017  . Chronic diastolic CHF (congestive heart failure) ([place]) 02/20/2017  . Obstructive sleep apnea hypopnea, severe 06/12/2016  . Obesity (BMI 30-39.9) 07/11/2015  . OSA (obstructive sleep apnea) 01/17/2015  . HTN (hypertension) 01/17/2015    Past Surgical History:  Procedure Laterality Date  . CARDIOVERSION N/A 04/02/2017   Procedure: CARDIOVERSION;  Surgeon: Skeet Latch, MD;  Location: [facility];  Service:  Cardiovascular;  Laterality: N/A;  . SKIN BIOPSY N/A 07/23/2018   mid upper back-moderate-0 tmt  . TONSILLECTOMY       OB History   No obstetric history on file.     Family History  Problem Relation Age of Onset  . Heart failure Mother   . Heart attack Father   . Hypertension Maternal Grandmother   . Heart attack Paternal Grandfather   . Breast cancer Maternal Aunt     Social History   Tobacco Use  . Smoking status: Never Smoker  . Smokeless tobacco: Never Used  Vaping Use  . Vaping Use: Never used  Substance Use Topics  . Alcohol use: No  . Drug use: No    Home Medications Prior to Admission medications   Medication Sig Start Date End Date Taking? Authorizing Provider  budesonide (ENTOCORT EC) 3 MG 24 hr capsule Take 9 mg by mouth every morning. 10/05/19   [provider]  Cholecalciferol 1.25 MG (50000 UT) TABS Take 1 tablet by mouth once a week.    [provider]  ciclopirox (PENLAC) 8 % solution Apply topically at bedtime. Apply over nail and surrounding skin. Apply daily over previous coat. After seven (7) days, may remove with alcohol and continue cycle. 04/26/19   Trula Slade, DPM  diltiazem (CARDIZEM CD) 360 MG 24 hr capsule TAKE 1 CAPSULE BY MOUTH DAILY. 04/04/20   Nahser, Wonda Cheng, MD  losartan (COZAAR) 50 MG tablet Take 50 mg by mouth daily. Reported on 07/20/2015 07/03/15   [provider]  metoprolol succinate (  TOPROL-XL) 50 MG 24 hr tablet Take 1 tablet (50 mg total) by mouth daily. Take with or immediately following a meal. 01/10/20   Nahser, Wonda Cheng, MD  mirabegron ER (MYRBETRIQ) 25 MG TB24 tablet Take 25 mg by mouth daily. Taking as a Field seismologist, Historical, MD  rivaroxaban (XARELTO) 20 MG TABS tablet TAKE ONE TABLET BY MOUTH DAILY WITH SUPPER 12/28/19   Nahser, Wonda Cheng, MD  sertraline (ZOLOFT) 100 MG tablet Take 200 mg by mouth at bedtime.     [provider]    Allergies    Sulfa antibiotics and  Epinephrine  Review of Systems   Review of Systems  Constitutional: Negative for activity change, appetite change and fever.  HENT: Positive for nosebleeds.   Respiratory: Negative for cough, chest tightness and shortness of breath.   Gastrointestinal: Negative for abdominal pain, nausea and vomiting.  Genitourinary: Negative for dysuria.  Musculoskeletal: Negative for arthralgias and back pain.  Skin: Negative for rash.  Neurological: Negative for dizziness, weakness and headaches.   all other systems are negative except as noted in the HPI and PMH.    Physical Exam Updated Vital Signs BP (!) 160/84 (BP Location: Right Arm)   Pulse 87   Temp 97.8 F (36.6 C) (Oral)   Resp 16   Ht 5\' 3"  (1.6 m)   Wt 70.3 kg   SpO2 98%   BMI 27.46 kg/m   Physical Exam Vitals and nursing note reviewed.  Constitutional:      General: She is not in acute distress.    Appearance: She is well-developed.  HENT:     Head: Normocephalic and atraumatic.     Nose:     Comments: Bleeding from right nare with clots.  Streaks of blood in oropharynx    Mouth/Throat:     Pharynx: No oropharyngeal exudate.  Eyes:     Conjunctiva/sclera: Conjunctivae normal.     Pupils: Pupils are equal, round, and reactive to light.  Neck:     Comments: No meningismus. Cardiovascular:     Rate and Rhythm: Normal rate. Rhythm irregular.     Heart sounds: Normal heart sounds. No murmur heard.   Pulmonary:     Effort: Pulmonary effort is normal. No respiratory distress.     Breath sounds: Normal breath sounds.  Abdominal:     Palpations: Abdomen is soft.     Tenderness: There is no abdominal tenderness. There is no guarding or rebound.  Musculoskeletal:        General: No tenderness. Normal range of motion.     Cervical back: Normal range of motion and neck supple.  Skin:    General: Skin is warm.  Neurological:     Mental Status: She is alert and oriented to person, place, and time.     Cranial Nerves: No  cranial nerve deficit.     Motor: No abnormal muscle tone.     Coordination: Coordination normal.     Comments:  5/5 strength throughout. CN 2-12 intact.Equal grip strength.   Psychiatric:        Behavior: Behavior normal.     ED Results / Procedures / Treatments   Labs (all labs ordered are listed, but only abnormal results are displayed) Labs Reviewed  CBC - Abnormal; Notable for the following components:      Result Value   RBC 5.13 (*)    Hemoglobin 15.4 (*)    HCT 46.2 (*)    All other components  within normal limits  BASIC METABOLIC PANEL  PROTIME-INR    EKG None  Radiology No results found.  Procedures .Epistaxis Management  Date/Time: 05/16/2020 6:37 AM Performed by: Ezequiel Essex, MD Authorized by: Ezequiel Essex, MD   Consent:    Consent obtained:  Emergent situation   Consent given by:  Patient   Risks, benefits, and alternatives were discussed: yes     Risks discussed:  Bleeding, nasal injury, pain and infection Universal protocol:    Procedure explained and questions answered to patient or proxy's satisfaction: yes     Relevant documents present and verified: yes     Test results available: no     Imaging studies available: no     Patient identity confirmed:  Verbally with patient Procedure details:    Treatment site:  R anterior   Treatment method:  Anterior pack, nasal tampon, nasal balloon, thrombin and silver nitrate   Treatment complexity:  Extensive   Treatment episode: recurring   Post-procedure details:    Assessment:  No improvement   Procedure completion:  Tolerated with difficulty  .Critical Care Performed by: Ezequiel Essex, MD Authorized by: Ezequiel Essex, MD   Critical care provider statement:    Critical care time (minutes):  60   Critical care was necessary to treat or prevent imminent or life-threatening deterioration of the following conditions: epistaxis.   Critical care was time spent personally by me on the  following activities:  Discussions with consultants, evaluation of patient's response to treatment, examination of patient, ordering and performing treatments and interventions, ordering and review of laboratory studies, ordering and review of radiographic studies, pulse oximetry, re-evaluation of patient's condition, obtaining history from patient or surrogate and review of old charts     Medications Ordered in ED Medications  tranexamic acid (CYKLOKAPRON) injection 500 mg (500 mg Topical Given 05/16/20 0247)    ED Course  I have reviewed the triage vital signs and the nursing notes.  Pertinent labs & imaging results that were available during my care of the patient were reviewed by me and considered in my medical decision making (see chart for details).    MDM Rules/Calculators/A&P                         Epistaxis on Xarelto.  Vitals are stable. Hypertensive.  Bleeding was initially controlled on arrival with gentle oozing.  She was able to express some clots. He was noted to be some pulsatile bleeding from her right nasal septum. She was given topical Afrin as well as lidocaine with epinephrine. TXA soaked pledgets were applied as well  Rhino Rocket was placed. Patient bled around this. Patient has very difficult to control epistaxis.  Nose has been packed several times but she continues to bleed around it.  There appears to be an arterial bleeding vessel on the nasal septum.  Battery cautery and silver nitrate cautery were attempted without success.    Discussed with Dr. Marcelline Deist of ENT.  He recommended Afrin soaked cotton pledgets held in place for 10 minutes followed by silver nitrate cautery. This was attempted and patient still continues to bleed.  Nose was packed again with a Rhino Rocket. Dr. Marcelline Deist also suggested using a Foley balloon  Patient will need transfer for ENT evaluation at [facility]. Discussed with Dr. Christy Gentles who is accepting.  Patient continues to ooze  around [employer] and packing.  Nasal clamp is placed. We will attempt to replace packing with Foley  balloon before transfer.  Transfer team is here.  Patient is requesting to leave, she feels the bleeding is controlled and does not want the Foley balloon placed at this time.  She has gentle oozing but is in no distress and has stable vital signs despite hypertension. She will be sent emergently to [facility] for ENT evaluation. Dr. Marcelline Deist to be contacted once she arrives at [facility].  Final Clinical Impression(s) / ED Diagnoses Final diagnoses:  Right-sided epistaxis    Rx / DC Orders ED Discharge Orders    None       Lucillia Corson, Annie Main, MD 05/16/20 [phone]

## 2020-05-16 NOTE — ED Notes (Signed)
Walked in to pt standing up and peeing on the floor. Iv Sticking out. Pt looks very confused and aggitated.

## 2020-05-16 NOTE — ED Notes (Signed)
Pt with nose bleed, denies pain at this time. States the nose bleed came on suddenly and denies trauma.

## 2020-05-17 DIAGNOSIS — R040 Epistaxis: Secondary | ICD-10-CM | POA: Diagnosis not present

## 2020-05-17 DIAGNOSIS — I4891 Unspecified atrial fibrillation: Secondary | ICD-10-CM | POA: Diagnosis not present

## 2020-05-17 LAB — CBC
HCT: 42.8 % (ref 36.0–46.0)
Hemoglobin: 14.1 g/dL (ref 12.0–15.0)
MCH: 30.3 pg (ref 26.0–34.0)
MCHC: 32.9 g/dL (ref 30.0–36.0)
MCV: 92 fL (ref 80.0–100.0)
Platelets: 277 10*3/uL (ref 150–400)
RBC: 4.65 MIL/uL (ref 3.87–5.11)
RDW: 15.8 % — ABNORMAL HIGH (ref 11.5–15.5)
WBC: 11.7 10*3/uL — ABNORMAL HIGH (ref 4.0–10.5)
nRBC: 0 % (ref 0.0–0.2)

## 2020-05-17 LAB — BASIC METABOLIC PANEL
Anion gap: 9 (ref 5–15)
BUN: 14 mg/dL (ref 8–23)
CO2: 23 mmol/L (ref 22–32)
Calcium: 9.9 mg/dL (ref 8.9–10.3)
Chloride: 109 mmol/L (ref 98–111)
Creatinine, Ser: 0.82 mg/dL (ref 0.44–1.00)
GFR, Estimated: >60 mL/min (ref >60)
Glucose, Bld: 136 mg/dL — ABNORMAL HIGH (ref 70–99)
Potassium: 3.6 mmol/L (ref 3.5–5.1)
Sodium: 141 mmol/L (ref 135–145)

## 2020-05-17 LAB — OCCULT BLOOD X 1 CARD TO LAB, STOOL: Fecal Occult Bld: POSITIVE — AB

## 2020-05-17 MED ORDER — METOPROLOL SUCCINATE ER 50 MG PO TB24
50.0000 mg | ORAL_TABLET | Freq: Once | ORAL | Status: AC
  Administered 2020-05-17: 50 mg via ORAL
  Filled 2020-05-17: qty 1

## 2020-05-17 MED ORDER — METOPROLOL SUCCINATE ER 100 MG PO TB24
100.0000 mg | ORAL_TABLET | Freq: Every day | ORAL | Status: DC
  Administered 2020-05-18 – 2020-05-23 (×6): 100 mg via ORAL
  Filled 2020-05-17 (×2): qty 1
  Filled 2020-05-17: qty 2
  Filled 2020-05-17: qty 1
  Filled 2020-05-17: qty 2
  Filled 2020-05-17: qty 1

## 2020-05-17 MED ORDER — CEPHALEXIN 250 MG PO CAPS
500.0000 mg | ORAL_CAPSULE | Freq: Three times a day (TID) | ORAL | Status: AC
  Administered 2020-05-17 – 2020-05-19 (×8): 500 mg via ORAL
  Filled 2020-05-17 (×7): qty 1
  Filled 2020-05-17: qty 2
  Filled 2020-05-17: qty 1

## 2020-05-17 MED ORDER — ONDANSETRON HCL 4 MG/2ML IJ SOLN
4.0000 mg | Freq: Once | INTRAMUSCULAR | Status: AC
  Administered 2020-05-17: 4 mg via INTRAVENOUS
  Filled 2020-05-17: qty 2

## 2020-05-17 NOTE — Progress Notes (Signed)
Subjective: No bleeding overnight  Objective: Vital signs in last 24 hours: Temp:  [96.5 F (35.8 C)-98.5 F (36.9 C)] 97.6 F (36.4 C) (04/06 0741) Pulse Rate:  [46-168] 88 (04/06 0800) Resp:  [11-31] 22 (04/06 0800) BP: (94-203)/(70-162) 94/84 (04/06 0800) SpO2:  [84 %-99 %] 92 % (04/06 0800) Weight:  [70.5 kg] 70.5 kg (04/06 0500) Wt Readings from Last 1 Encounters:  05/17/20 70.5 kg    Intake/Output from previous day: 04/05 0701 - 04/06 0700 In: 238.8 [I.V.:147.8; IV Piggyback:91] Out: 1100 [Urine:1100] Intake/Output this shift: Total I/O In: 10 [I.V.:10] Out: -   Epistat in place - working well without alar necrosis  Recent Labs    05/16/20 0810 05/17/20 0155  WBC 6.9 11.7*  HGB 14.3 14.1  HCT 43.5 42.8  PLT 216 277    Recent Labs    05/16/20 0810 05/17/20 0155  NA 141 141  K 3.4* 3.6  CL 110 109  CO2 25 23  GLUCOSE 116* 136*  BUN 19 14  CREATININE 0.76 0.82  CALCIUM 9.4 9.9    Medications: I have reviewed the patient's current medications.  Assessment/Plan: Epistaxis  Likely removal of pack Thursday.  Remain on cardiac/pulm telemetry while pack in place   LOS: 1 day   Beth Haynes 05/17/2020, 9:29 AM

## 2020-05-17 NOTE — H&P (Addendum)
This is a progress note   NAME:  Beth Haynes, MRN:  [id number], DOB:  August 14, 1945, LOS: 1 ADMISSION DATE:  05/16/2020, CONSULTATION DATE:  05/16/20 REFERRING MD:  Dina Rich CHIEF COMPLAINT:  Epistaxis   History of Present Illness:  Beth Haynes is a 75 y.o. female who presented to [facility] 4/5 with right sided epistaxis.  Multiple attempts to control bleeding failed including cautery and packing; therefore, she was transferred to [facility] for further evaluation by ENT.  She is on Xarelto for A.fib after failing previous cardioversion.  After arrival to [facility], she was seen by Dr. Marcelline Deist of ENT who removed packing and applied Lidocaine and Afrin before attempting silver nitrate which was not successful.  He subsequently placed an Epistat catheter with 5cc in posterior balloon and 12cc in anterior balloon.  Bleeding was controlled thereafter.  Of note she denies headache, high blood pressure, chest paint, SOB prior to nose bleed Denies CP, SOB, light headedness, and dizziness during the nose bleed and currently.   ICU admission was recommended for observation overnight.  Pertinent  Medical History:  has OSA (obstructive sleep apnea); HTN (hypertension); Obesity (BMI 30-39.9); Obstructive sleep apnea hypopnea, severe; Depression; Chronic diastolic CHF (congestive heart failure) ([place]); Persistent atrial fibrillation ([place]); Paroxysmal atrial fibrillation ([place]); Intolerance of continuous positive airway pressure (CPAP) ventilation; Complex sleep apnea syndrome; Intracerebral hemorrhage; Anxiety; Hyperlipidemia; Right-sided epistaxis; and Epistaxis on their problem list.  Significant Hospital Events: Including procedures, antibiotic start and stop dates in addition to other pertinent events   4/5: admit, nasal Epistat placed by ENT.  Interim History / Subjective:  Beth Haynes BM, not unexpected. Also some n/v resolved with zofran.  Objective:  Blood pressure (!) 155/97, pulse 89, temperature 97.6 F (36.4 C), temperature  source Axillary, resp. rate (!) 21, height 5\' 3"  (1.6 m), weight 70.5 kg, SpO2 91 %.        Intake/Output Summary (Last 24 hours) at 05/17/2020 1004 Last data filed at 05/17/2020 0900 Gross per 24 hour  Intake 258.77 ml  Output 1100 ml  Net -841.23 ml   Filed Weights   05/16/20 0148 05/17/20 0500  Weight: 70.3 kg 70.5 kg    Examination: General: elderly female in NAD Neuro: Alert, oriented, non-focal. Tearful  HEENT: epistat in place, no bleeding Cardiovascular: Tachy, IRIR, no MRG Lungs: Clear throughout all fields.  Abdomen: Soft, non-tender, non-distended Musculoskeletal: minimal edema Skin: Grossly intact. MMM.   Labs look good  Resolved Hospital Problem list:    Assessment & Plan:  Nose bleed related to HTN, xarelto Afib/RVR on dilt gtt Anxiety  - Continue epistat, keflex TSS ppx - PO dilt and BB, wean dilt gtt - Continue to hold xarelto  Best practice (evaluated daily):  Diet:  Oral Pain/Anxiety/Delirium protocol (if indicated): No VAP protocol (if indicated): Not indicated DVT prophylaxis: SCD GI prophylaxis: N/A Glucose control:  SSI No Central venous access:  N/A Arterial line:  N/A Foley:  N/A Mobility:  bed rest  PT consulted: N/A Last date of multidisciplinary goals of care discussion  Code Status:  full code Disposition: ICU pending dilt drip wean   Patient critically ill due to afib/rvr Interventions to address this today diltiazem titration Risk of deterioration without these interventions is high  I personally spent 31 minutes providing critical care not including any separately billable procedures  Erskine Emery MD [facility]  Prefer epic messenger for cross cover needs If after hours, please call E-link

## 2020-05-17 NOTE — Progress Notes (Signed)
eLink Physician-Brief Progress Note Patient Name: Beth Haynes DOB: Apr 04, 1945 MRN: [id number]   Date of Service  05/17/2020  HPI/Events of Note  Vomiting bile = QTc interval = 0.48 seconds.   eICU Interventions  Plan: 1. Zofran 4 mg IV X 1 now.      Intervention Category Major Interventions: Other:  Lysle Dingwall 05/17/2020, 6:27 AM

## 2020-05-17 NOTE — Progress Notes (Signed)
Removed peripheral IV from left forearm after patient complained of pain.  Upon assessment, IV site was pink and painful to touch.  Will continue to monitor patient.

## 2020-05-17 NOTE — Progress Notes (Signed)
eLink Physician-Brief Progress Note Patient Name: TASHEBA HENSON DOB: Jun 12, 1945 MRN: [id number]   Date of Service  05/17/2020  HPI/Events of Note  Large tarry BM - Nursing request to send stool for occult blood. This is undoubtedly blood. Likely related to epistaxis and swallowed blood yesterday. Last Hgb = 14.3. CBC pending for 5 AM. BP =120/100. Do not feel that sending stool for occult blood will add anything to the management of this patient.  eICU Interventions  Continue present management.      Intervention Category Major Interventions: Other:  Gamal Todisco Cornelia Copa 05/17/2020, 2:10 AM

## 2020-05-18 DIAGNOSIS — R040 Epistaxis: Secondary | ICD-10-CM | POA: Diagnosis not present

## 2020-05-18 LAB — HEMOGLOBIN AND HEMATOCRIT, BLOOD
HCT: 39.1 % (ref 36.0–46.0)
Hemoglobin: 12.9 g/dL (ref 12.0–15.0)

## 2020-05-18 LAB — GLUCOSE, CAPILLARY: Glucose-Capillary: 162 mg/dL — ABNORMAL HIGH (ref 70–99)

## 2020-05-18 NOTE — Progress Notes (Addendum)
This is a progress note   NAME:  Beth Haynes, MRN:  [id number], DOB:  1945-08-26, LOS: 2 ADMISSION DATE:  05/16/2020, CONSULTATION DATE:  05/16/20 REFERRING MD:  [place] CHIEF COMPLAINT:  Epistaxis   History of Present Illness:  75 y/o F who presented to [facility] 4/5 with right sided epistaxis.  Multiple attempts to control bleeding failed including cautery and packing; therefore, she was transferred to [facility] for further evaluation by ENT.  She is on Xarelto for A.fib after failing previous cardioversion.   After arrival to [facility], she was seen by Dr. Marcelline Deist of ENT who removed packing and applied Lidocaine and Afrin before attempting silver nitrate which was not successful.  He subsequently placed an Epistat catheter with 5cc in posterior balloon and 12cc in anterior balloon.  Bleeding was controlled thereafter.  She denied headache, high blood pressure, chest paint, SOB prior to nose bleed.      Pertinent  Medical History:  has OSA (obstructive sleep apnea); HTN (hypertension); Obesity (BMI 30-39.9); Obstructive sleep apnea hypopnea, severe; Depression; Chronic diastolic CHF (congestive heart failure) ([place]); Persistent atrial fibrillation ([place]); Paroxysmal atrial fibrillation ([place]); Intolerance of continuous positive airway pressure (CPAP) ventilation; Complex sleep apnea syndrome; Intracerebral hemorrhage; Anxiety; Hyperlipidemia; Right-sided epistaxis; and Epistaxis on their problem list.  Significant Hospital Events: Including procedures, antibiotic start and stop dates in addition to other pertinent events    4/5 admit, nasal Epistat placed by ENT. Had tarry BM, N/V which resolved with zofran. COVID / Flu negative.  Hypertensive, AFwRVR requiring cardizem gtt  4/7 Off cardizem gtt.  Nasal packing remains in place  Interim History / Subjective:  Afebrile  On RA Glucose range 116-162 I/O 625 ml UOP, -1.3L in last 24 hours Pt reports she is thirsty, notes she does feel as if she has some post nasal  drip  Objective:  Blood pressure (!) 146/93, pulse 93, temperature (!) 97.5 F (36.4 C), temperature source Oral, resp. rate 18, height 5\' 3"  (1.6 m), weight 70.6 kg, SpO2 94 %.        Intake/Output Summary (Last 24 hours) at 05/18/2020 8921 Last data filed at 05/18/2020 0600 Gross per 24 hour  Intake 83.78 ml  Output 625 ml  Net -541.22 ml   Filed Weights   05/16/20 0148 05/17/20 0500 05/18/20 0500  Weight: 70.3 kg 70.5 kg 70.6 kg    Examination: General: elderly adult female lying in bed in NAD HEENT: MM pink/moist, nasal packing in right nare, anicteric  Neuro: AAOx4, speech clear, MAE CV: s1s2 irr irr, AF on monitor, no m/r/g PULM: non-labored on RA, lungs bilaterally clear GI: soft, bsx4 active, tolerating PO's  Extremities: warm/dry, no edema  Skin: no rashes or lesions   Resolved Hospital Problem list:    Assessment & Plan:   Epistaxis  S/P ENT evaluation with packing  -packing per ENT, appreciate assistance with patient care -continue empiric keflex for toxic shock prophylaxis  -hold anticoagulation  -follow up Hgb / Hct -monitor for further bleeding  Chronic AF with RVR  -continue tele monitoring -continue cardizem PO  -once packing removed, she will need to be re-challenged with anticoagulation and monitor for bleeding before she can be discharged  HTN -continue cozaar, lopressor  Anxiety  -continue zoloft   Best practice (evaluated daily):  Diet:  Oral Pain/Anxiety/Delirium protocol (if indicated): No VAP protocol (if indicated): Not indicated DVT prophylaxis: SCD GI prophylaxis: N/A Glucose control:  SSI No Central venous access:  N/A Arterial line:  N/A Foley:  N/A Mobility:  bed rest  PT consulted: N/A Last date of multidisciplinary goals of care discussion  Code Status:  full code Disposition: Progressive care, to [facility] as of 4/8 am.     Critical Care Time: n/a   Noe Gens, MSN, APRN, NP-C, AGACNP-BC [facility] 05/18/2020, 8:17 AM   Please see [email or web address] for pager details.   From 7A-7P if no response, please call [phone] After hours, please call ELink [phone]

## 2020-05-18 NOTE — Progress Notes (Signed)
Spoke to [place] from ENT office and she stated ENT is aware that patient has Epistat, however it stays in for 5 day and the earliest it will come out is tomorrow or Saturday.

## 2020-05-19 DIAGNOSIS — I482 Chronic atrial fibrillation, unspecified: Secondary | ICD-10-CM | POA: Diagnosis not present

## 2020-05-19 DIAGNOSIS — R040 Epistaxis: Secondary | ICD-10-CM | POA: Diagnosis not present

## 2020-05-19 LAB — COMPREHENSIVE METABOLIC PANEL
ALT: 16 U/L (ref 0–44)
AST: 12 U/L — ABNORMAL LOW (ref 15–41)
Albumin: 2.6 g/dL — ABNORMAL LOW (ref 3.5–5.0)
Alkaline Phosphatase: 45 U/L (ref 38–126)
Anion gap: 4 — ABNORMAL LOW (ref 5–15)
BUN: 28 mg/dL — ABNORMAL HIGH (ref 8–23)
CO2: 30 mmol/L (ref 22–32)
Calcium: 9.4 mg/dL (ref 8.9–10.3)
Chloride: 101 mmol/L (ref 98–111)
Creatinine, Ser: 1.39 mg/dL — ABNORMAL HIGH (ref 0.44–1.00)
GFR, Estimated: 40 mL/min — ABNORMAL LOW (ref >60)
Glucose, Bld: 123 mg/dL — ABNORMAL HIGH (ref 70–99)
Potassium: 3.5 mmol/L (ref 3.5–5.1)
Sodium: 135 mmol/L (ref 135–145)
Total Bilirubin: 0.8 mg/dL (ref 0.3–1.2)
Total Protein: 4.9 g/dL — ABNORMAL LOW (ref 6.5–8.1)

## 2020-05-19 LAB — BLOOD GAS, ARTERIAL
Acid-Base Excess: 2.5 mmol/L — ABNORMAL HIGH (ref 0.0–2.0)
Bicarbonate: 26.5 mmol/L (ref 20.0–28.0)
Drawn by: 20012
FIO2: 23
O2 Saturation: 94.4 %
Patient temperature: 37
pCO2 arterial: 40.7 mmHg (ref 32.0–48.0)
pH, Arterial: 7.429 (ref 7.350–7.450)
pO2, Arterial: 68.6 mmHg — ABNORMAL LOW (ref 83.0–108.0)

## 2020-05-19 LAB — CBC
HCT: 35.4 % — ABNORMAL LOW (ref 36.0–46.0)
Hemoglobin: 11.7 g/dL — ABNORMAL LOW (ref 12.0–15.0)
MCH: 30.9 pg (ref 26.0–34.0)
MCHC: 33.1 g/dL (ref 30.0–36.0)
MCV: 93.4 fL (ref 80.0–100.0)
Platelets: 219 10*3/uL (ref 150–400)
RBC: 3.79 MIL/uL — ABNORMAL LOW (ref 3.87–5.11)
RDW: 15.1 % (ref 11.5–15.5)
WBC: 7.4 10*3/uL (ref 4.0–10.5)
nRBC: 0 % (ref 0.0–0.2)

## 2020-05-19 MED ORDER — SALINE SPRAY 0.65 % NA SOLN
2.0000 | NASAL | Status: DC
  Administered 2020-05-19 – 2020-05-23 (×26): 2 via NASAL
  Filled 2020-05-19 (×2): qty 44

## 2020-05-19 MED ORDER — SODIUM CHLORIDE 0.9 % IV SOLN: INTRAVENOUS | Status: DC

## 2020-05-19 MED ORDER — SODIUM CHLORIDE 0.9 % IV BOLUS
250.0000 mL | Freq: Once | INTRAVENOUS | Status: AC
  Administered 2020-05-19: 250 mL via INTRAVENOUS

## 2020-05-19 NOTE — Progress Notes (Signed)
Subjective: No active bleeding.  Objective: Vital signs in last 24 hours: Temp:  [96.9 F (36.1 C)-98.8 F (37.1 C)] 97.6 F (36.4 C) (04/08 0748) Pulse Rate:  [70-102] 94 (04/08 0930) Resp:  [12-27] 14 (04/08 0700) BP: (98-139)/(44-92) 106/62 (04/08 0930) SpO2:  [82 %-99 %] 96 % (04/08 0800) Weight:  [84.9 kg] 84.9 kg (04/08 0500) Wt Readings from Last 1 Encounters:  05/19/20 84.9 kg    Intake/Output from previous day: 04/07 0701 - 04/08 0700 In: 150 [P.O.:150] Out: 650 [Urine:650] Intake/Output this shift: Total I/O In: 200 [P.O.:200] Out: -   General appearance: alert, cooperative and no distress Nose: double balloon pack in right nasal passage with crusted blood at nare, pack removed and no active bleeding, crusted blood removed  Recent Labs    05/17/20 0155 05/18/20 0828  WBC 11.7*  --   HGB 14.1 12.9  HCT 42.8 39.1  PLT 277  --     Recent Labs    05/17/20 0155  NA 141  K 3.6  CL 109  CO2 23  GLUCOSE 136*  BUN 14  CREATININE 0.82  CALCIUM 9.9    Medications: I have reviewed the patient's current medications.  Assessment/Plan: Right epistaxis, anticoagulation  Right nasal pack removed with no active bleeding.  Would wait a couple of days before restarting anti-coagulation.  No nose blowing for two weeks.  Frequent saline spray.   LOS: 3 days   Melida Quitter 05/19/2020, 10:38 AM

## 2020-05-19 NOTE — Progress Notes (Addendum)
Case discussed with pulmonary team-patient not able to be discharged today as PCCM team unable to reach the husband and PT OT eval pending,  blood pressure soft and PCCM team advised monitoring overnight;patient's nasal packing is off and ENT has advised to resume Eliquis in couple of days, likely outpatient with close follow-up with PCP/ENT. Patient is being transferred to floor and TRH will follow up tomorrow. This am patient was alert awake oriented, not in distress and wanting to go home. Seen again this evening-as she was lethargic/sleepy-she does wake up and answered questions,mild confusion,says she is sleepy,able to follow commands, and non focal.We will check abg, now, BP has been soft which could , we will give 250  Ml  x1and  Keep on 50 ml/hr overnight. May need to cut back on her antihypertensives.Will check routine labs, monitor routine labs.

## 2020-05-19 NOTE — Evaluation (Signed)
Physical Therapy Evaluation Patient Details Name: Beth Haynes MRN: [id number] DOB: Feb 16, 1945 Today's Date: 05/19/2020   History of Present Illness  75 y.o. female presents to [facility] ED on 05/16/2020 with R epistaxis, multiple attempts to stop bleeding were unsuccessful, and pt was referred for ENT consult. ENT placed epistat catheter on 4/5 with resolution of bleeding. Epistat removed 4.7, no further nose bleeding noted. PMH includes OSA, HTN, depression, CHF, Afib, ICH, HLD.  Clinical Impression  Pt presents to PT with deficits in level of arousal, functional mobility, cognition, strength, power. Evaluation is significantly limited by lethargy and poor patient participation. Pt refuses to attempt out of bed mobility, wanting to return to supine to fall back asleep. Pt will benefit from further PT assessment of transfers and gait prior to discharge. Pt's currently lethargy is concerning, thus PT recommends SNF placement. If pt's arousal improves and pt is able to mobilize then recommendations may be reconsidered. PT will follow up as soon as able to further assess.    Follow Up Recommendations SNF (may progress to home if more alert and more agreeable to mobilize)    Equipment Recommendations  Wheelchair (measurements PT);Wheelchair cushion (measurements PT);Hospital bed (mechanical lift)    Recommendations for Other Services       Precautions / Restrictions Precautions Precautions: Fall Precaution Comments: monitor BP Restrictions Weight Bearing Restrictions: No      Mobility  Bed Mobility Overal bed mobility: Needs Assistance Bed Mobility: Supine to Sit;Sit to Supine     Supine to sit: Min guard Sit to supine: Min guard        Transfers Overall transfer level: Needs assistance               General transfer comment: attempt to initiate sit to stand however pt does not attempt to provide effort and refuses at this time, requesting to return to bed despite PT  encouragement  Ambulation/Gait                Stairs            Wheelchair Mobility    Modified Rankin (Stroke Patients Only)       Balance Overall balance assessment: Needs assistance Sitting-balance support: No upper extremity supported;Feet unsupported Sitting balance-Leahy Scale: Fair     Standing balance support:  (unable to asses, pt refuses standing attempts)                                 Pertinent Vitals/Pain Pain Assessment: Faces Faces Pain Scale: No hurt    Home Living Family/patient expects to be discharged to:: Private residence Living Arrangements: Spouse/significant other Available Help at Discharge: Family;Available 24 hours/day Type of Home: House       Home Layout: Two level   Additional Comments: pt is lethargic, confused, and unable to provide accurate history    Prior Function Level of Independence: Independent         Comments: pt reports independent mobility, does not drive, unreliable historian     Hand Dominance        Extremity/Trunk Assessment   Upper Extremity Assessment Upper Extremity Assessment: Generalized weakness    Lower Extremity Assessment Lower Extremity Assessment: Generalized weakness    Cervical / Trunk Assessment Cervical / Trunk Assessment: Kyphotic  Communication   Communication: No difficulties  Cognition Arousal/Alertness: Lethargic Behavior During Therapy: Flat affect Overall Cognitive Status: No family/caregiver present to determine baseline cognitive functioning  General Comments: pt is lethargic and requires frequent stimulation to remain aroused. Pt is unable to provide history, with poor memory, and demonstrates impaired awareness.      General Comments General comments (skin integrity, edema, etc.): hypotensive, 110/52 with sitting at edge of bed, 96/56 upon return to supine    Exercises     Assessment/Plan    PT  Assessment Patient needs continued PT services  PT Problem List Decreased strength;Decreased activity tolerance;Decreased balance;Decreased mobility;Decreased cognition;Decreased knowledge of use of DME;Decreased safety awareness;Decreased knowledge of precautions       PT Treatment Interventions DME instruction;Gait training;Functional mobility training;Therapeutic activities;Therapeutic exercise;Balance training;Neuromuscular re-education;Cognitive remediation;Stair training;Patient/family education    PT Goals (Current goals can be found in the Care Plan section)  Acute Rehab PT Goals Patient Stated Goal: PT goal to improve out of bed mobility and maintain arousal PT Goal Formulation: Patient unable to participate in goal setting Time For Goal Achievement: 06/02/20 Potential to Achieve Goals: Fair    Frequency Min 3X/week (see 3x in case arousal improves)   Barriers to discharge        Co-evaluation               AM-PAC PT "6 Clicks" Mobility  Outcome Measure Help needed turning from your back to your side while in a flat bed without using bedrails?: A Little Help needed moving from lying on your back to sitting on the side of a flat bed without using bedrails?: A Little Help needed moving to and from a bed to a chair (including a wheelchair)?: Total Help needed standing up from a chair using your arms (e.g., wheelchair or bedside chair)?: Total Help needed to walk in hospital room?: Total Help needed climbing 3-5 steps with a railing? : Total 6 Click Score: 10    End of Session   Activity Tolerance: Patient limited by lethargy Patient left: in bed;with call bell/phone within reach;with bed alarm set;with nursing/sitter in room Nurse Communication: Mobility status;Need for lift equipment PT Visit Diagnosis: Other abnormalities of gait and mobility (R26.89);Muscle weakness (generalized) (M62.81)    Time: 8341-9622 PT Time Calculation (min) (ACUTE ONLY): 12  min   Charges:   PT Evaluation $PT Eval Low Complexity: 1 Low          Zenaida Niece, PT, DPT Acute Rehabilitation Pager: [phone]   Zenaida Niece 05/19/2020, 5:56 PM

## 2020-05-19 NOTE — Progress Notes (Signed)
This is a progress note   NAME:  Beth Haynes, MRN:  [id number], DOB:  1945-10-28, LOS: 3 ADMISSION DATE:  05/16/2020, CONSULTATION DATE:  05/16/20 REFERRING MD:  Horton CHIEF COMPLAINT:  Epistaxis   History of Present Illness:  75 y/o F who presented to [facility] 4/5 with right sided epistaxis.  Multiple attempts to control bleeding failed including cautery and packing; therefore, she was transferred to [facility] for further evaluation by ENT.  She is on Xarelto for A.fib after failing previous cardioversion.   After arrival to [facility], she was seen by Dr. Marcelline Deist of ENT who removed packing and applied Lidocaine and Afrin before attempting silver nitrate which was not successful.  He subsequently placed an Epistat catheter with 5cc in posterior balloon and 12cc in anterior balloon.  Bleeding was controlled thereafter. Packing was removed on 4/8.      Pertinent  Medical History:  has OSA (obstructive sleep apnea); HTN (hypertension); Obesity (BMI 30-39.9); Obstructive sleep apnea hypopnea, severe; Depression; Chronic diastolic CHF (congestive heart failure) ([place]); Persistent atrial fibrillation ([place]); Paroxysmal atrial fibrillation ([place]); Intolerance of continuous positive airway pressure (CPAP) ventilation; Complex sleep apnea syndrome; Intracerebral hemorrhage; Anxiety; Hyperlipidemia; Right-sided epistaxis; and Epistaxis on their problem list.  Significant Hospital Events: Including procedures, antibiotic start and stop dates in addition to other pertinent events    4/5 admit, nasal Epistat placed by ENT. Had tarry BM, N/V which resolved with zofran. COVID / Flu negative.  Hypertensive, AFwRVR requiring cardizem gtt  4/7 Off cardizem gtt.  Nasal packing remains in place  4/8 Nasal packing removed  Interim History / Subjective:  Nasal packing removed per ENT Pt pending transfer to floor RN reports pt was unsteady getting up to chair, required assistance  Objective:  Blood pressure (!) 108/59, pulse 88,  temperature 98.9 F (37.2 C), temperature source Oral, resp. rate 16, height 5\' 3"  (1.6 m), weight 84.9 kg, SpO2 94 %.        Intake/Output Summary (Last 24 hours) at 05/19/2020 1417 Last data filed at 05/19/2020 0800 Gross per 24 hour  Intake 200 ml  Output 650 ml  Net -450 ml   Filed Weights   05/17/20 0500 05/18/20 0500 05/19/20 0500  Weight: 70.5 kg 70.6 kg 84.9 kg    Examination: General: elderly adult female sitting up in chair in NAD eating lunch  HEENT: MM pink/moist, dried bloody secretions on right nare Neuro: AAOx4, speech clear, MAE  CV: s1s2 irr irr, AF on monitor, no m/r/g PULM: non-labored on RA, lungs clear GI: soft, bsx4 active  Extremities: warm/dry, no edema  Skin: no rashes or lesions  Resolved Hospital Problem list:    Assessment & Plan:   Epistaxis  S/P ENT evaluation with packing, admitted to ICU with concerns for airway protection / possible intubation need, aspiration risk.  -packing removed, monitor for repeat bleeding   -per ENT > ok to resume anti-coagulation in a "couple" of days, no nose blowing for two weeks -would plan to restart anticoagulation on 4/11 and monitor closely  -stop empiric keflex after next dose -follow up Hgb in am   Chronic AF with RVR  ? If patient was taking home medications appropriately given she presented hypertensive and in [facility] with epistaxis  -tele monitoring while inpatient  -continue cardizem PO  -she will resume anticoagulation as above  OSA -hold CPAP with epistaxis   HTN -continue home lopressor, cozaar   Anxiety  -continue zoloft   Deconditioning  Using furniture to assist with  transfer, reaching for stationary objects, required assist  -PT/OT evaluation > suspect she will need some form of home assistance  Best practice (evaluated daily):  Diet:  Oral Pain/Anxiety/Delirium protocol (if indicated): No VAP protocol (if indicated): Not indicated DVT prophylaxis: SCD GI prophylaxis: N/A Glucose  control:  SSI No Central venous access:  N/A Arterial line:  N/A Foley:  N/A Mobility:  bed rest  PT consulted: N/A Last date of multidisciplinary goals of care discussion  Code Status:  full code Disposition: Progressive care, per [facility]  / primary   Critical Care Time: n/a   Noe Gens, MSN, APRN, NP-C, AGACNP-BC [facility] 05/19/2020, 2:17 PM   Please see [email or web address] for pager details.   From 7A-7P if no response, please call [phone] After hours, please call ELink [phone]

## 2020-05-19 NOTE — Progress Notes (Signed)
Patient transferred to to #east notified husband by voicemail. Patient belongs to include [employer] and [employer].

## 2020-05-20 ENCOUNTER — Inpatient Hospital Stay (HOSPITAL_COMMUNITY): Payer: Medicare Other

## 2020-05-20 DIAGNOSIS — R040 Epistaxis: Secondary | ICD-10-CM | POA: Diagnosis not present

## 2020-05-20 LAB — CBC
HCT: 35.8 % — ABNORMAL LOW (ref 36.0–46.0)
Hemoglobin: 11.8 g/dL — ABNORMAL LOW (ref 12.0–15.0)
MCH: 30.3 pg (ref 26.0–34.0)
MCHC: 33 g/dL (ref 30.0–36.0)
MCV: 92 fL (ref 80.0–100.0)
Platelets: 234 10*3/uL (ref 150–400)
RBC: 3.89 MIL/uL (ref 3.87–5.11)
RDW: 15.4 % (ref 11.5–15.5)
WBC: 6.5 10*3/uL (ref 4.0–10.5)
nRBC: 0 % (ref 0.0–0.2)

## 2020-05-20 LAB — BASIC METABOLIC PANEL
Anion gap: 8 (ref 5–15)
BUN: 26 mg/dL — ABNORMAL HIGH (ref 8–23)
CO2: 25 mmol/L (ref 22–32)
Calcium: 9.2 mg/dL (ref 8.9–10.3)
Chloride: 104 mmol/L (ref 98–111)
Creatinine, Ser: 1 mg/dL (ref 0.44–1.00)
GFR, Estimated: 59 mL/min — ABNORMAL LOW (ref >60)
Glucose, Bld: 106 mg/dL — ABNORMAL HIGH (ref 70–99)
Potassium: 3.5 mmol/L (ref 3.5–5.1)
Sodium: 137 mmol/L (ref 135–145)

## 2020-05-20 NOTE — Social Work (Signed)
SW aware of PT recs for SNF. SW will f/u with pt for preference.  Marga Hoots, MSW, LCSWA

## 2020-05-20 NOTE — Progress Notes (Signed)
Physical Therapy Treatment Patient Details Name: Beth Haynes MRN: [id number] DOB: 1945-08-15 Today's Date: 05/20/2020    History of Present Illness 75 y.o. female presents to [facility] ED on 05/16/2020 with R epistaxis, multiple attempts to stop bleeding were unsuccessful, and pt was referred for ENT consult. ENT placed epistat catheter on 4/5 with resolution of bleeding. Epistat removed 4.7, no further nose bleeding noted. PMH includes OSA, HTN, depression, CHF, Afib, ICH, HLD.    PT Comments    Pt reports lack of confidence and ability to mobilize. With gait activities, the patient required multiple verbal and tactile cues to prevent from veering to R and running into obstacles. Pt is aware of her condition, but made statements throughout session that seem to suggest her confusion. Statements included "Nadara Mustard ate a snail, he has been bad", patient thinking her husband ate a snail after being told a fish at the nurses station had eaten the snail. The patient proceeded to ask if her dog could come and sit on her bed with her. Strength screening revealed slight L sided weakness compared to R, but patient reports that is normal for her, this would not seem to explain R drift during gait. The list of impairments above present as a safety risk for the patient and concern for falls. Spouse is incredibly hard of hearing, if pt were to fall at home he may not be able to hear to help. Further acute PT will benefit the pt to address limitations in gait and increase safety for independent mobility. Following acute PT, the student therapist recommends admission to a SNF for full return to independent functional status.    Follow Up Recommendations  SNF     Equipment Recommendations  Rolling walker with 5" wheels;Wheelchair (measurements PT);Wheelchair cushion (measurements PT)    Recommendations for Other Services       Precautions / Restrictions Precautions Precautions: Fall Precaution Comments: monitor  BP Restrictions Weight Bearing Restrictions: No    Mobility  Bed Mobility Overal bed mobility: Needs Assistance Bed Mobility: Supine to Sit     Supine to sit: Min guard          Transfers Overall transfer level: Needs assistance Equipment used: Rolling walker (2 wheeled) Transfers: Sit to/from Stand Sit to Stand: Min assist            Ambulation/Gait Ambulation/Gait assistance: Min assist (To steer walker. Pt veers to the R during ambulation.) Gait Distance (Feet): 120 Feet Assistive device: Rolling walker (2 wheeled) Gait Pattern/deviations: Step-through pattern;Drifts right/left Gait velocity: reduced Gait velocity interpretation: <1.31 ft/sec, indicative of household ambulator General Gait Details:  (Pt tended to drift to the right with RW. Manual cues to correct drift to neutral, maintains drift throughout gait.)   Stairs             Wheelchair Mobility    Modified Rankin (Stroke Patients Only)       Balance Overall balance assessment: Needs assistance Sitting-balance support: No upper extremity supported;Feet unsupported Sitting balance-Leahy Scale: Fair     Standing balance support: Bilateral upper extremity supported Standing balance-Leahy Scale: Poor                              Cognition Arousal/Alertness: Awake/alert;Lethargic (lethargic initally but becomes alert with stimulation and activity.) Behavior During Therapy: WFL for tasks assessed/performed Overall Cognitive Status: Impaired/Different from baseline Area of Impairment: Orientation;Attention;Memory;Safety/judgement;Awareness;Problem solving  Orientation Level: Disoriented to;Situation;Time Current Attention Level: Focused Memory: Decreased recall of precautions;Decreased short-term memory (Pt made attempts to removing gauze on L arm covering IV. Pt was encouraged to leave the gauze on.)   Safety/Judgement: Decreased awareness of  safety Awareness: Intellectual Problem Solving: Slow processing;Requires verbal cues;Requires tactile cues General Comments: Pt was initally lethargic, but became alert/aware with stimulation and activity. Pt appeared confused and made statements that were not accurate.      Exercises      General Comments        Pertinent Vitals/Pain Pain Assessment: No/denies pain    Home Living                      Prior Function            PT Goals (current goals can now be found in the care plan section) Acute Rehab PT Goals Patient Stated Goal: PT goal to improve out of bed mobility and maintain arousal Progress towards PT goals: Progressing toward goals    Frequency    Min 3X/week (may progress to home)      PT Plan Current plan remains appropriate    Co-evaluation              AM-PAC PT "6 Clicks" Mobility   Outcome Measure  Help needed turning from your back to your side while in a flat bed without using bedrails?: A Little Help needed moving from lying on your back to sitting on the side of a flat bed without using bedrails?: A Little Help needed moving to and from a bed to a chair (including a wheelchair)?: A Little Help needed standing up from a chair using your arms (e.g., wheelchair or bedside chair)?: A Little Help needed to walk in hospital room?: A Little Help needed climbing 3-5 steps with a railing? : A Lot 6 Click Score: 17    End of Session Equipment Utilized During Treatment: Gait belt Activity Tolerance: Patient limited by fatigue Patient left: in chair;with call bell/phone within reach;with chair alarm set;with family/visitor present Nurse Communication: Mobility status PT Visit Diagnosis: Other abnormalities of gait and mobility (R26.89);Difficulty in walking, not elsewhere classified (R26.2);Muscle weakness (generalized) (M62.81);Other (comment) (confusion)     Time: 1030-1105 PT Time Calculation (min) (ACUTE ONLY): 35  min  Charges:  $Gait Training: 8-22 mins $Therapeutic Activity: 8-22 mins                     Acute Rehab  Pager: [phone]    [employer], SPT 05/20/2020, 12:19 PM

## 2020-05-20 NOTE — Progress Notes (Signed)
PROGRESS NOTE    MERLIE NOGA  JSE:[id number] DOB: 1945/10/14 DOA: 05/16/2020 PCP: Velna Hatchet, MD   Chief Complaint  Patient presents with  . Epistaxis  Brief Narrative: 75 old female with history of chronic A. fib on Eliquis, OSA-CPAP intolerant, HTN morbid obesity, depression/anxiety, chronic diastolic CHF, hyperlipidemia presented to [facility] P4/5 with right-sided epistaxis.  Multiple attempts to control bleeding failed including cautery and packing and transferred to [facility] seen by ENT Epistat was placed and patient also had A. fib with RVR and hypotensive needing Cardizem drip and ICU admission. Her hemoglobin remained stable overall although some drop.  Monitor in ICU, on 4/8 nasal packing was removed.  Patient appears fairly ill frail needing PT eval blood pressure soft so discharge was held and transferred to [facility] service. Patient has been lethargic sleepy sometimes confused.  Subjective: Seen and examined this morning Overnight placed on mitten this morning woke up when I examined her she became completely alert awake oriented x3.  She was placed on facemask overnight for low oxygen as nasal cannula unable to be placed due to her epistaxis. Appears more alert awake today. Blood pressure has improved after IV fluid boluses On reassessment discussed with RN- patient has been up on the chair this afternoon ate her breakfast well on room air worked with PT  Assessment & Plan: Right-sided severe epistaxis in the setting of anticoagulation.  Epistal removed  By ENT 4/8. ENT advised to hold patient's Xarelto for couple of days likely resume on 4/11.  No nose blowing for 2 weeks and as needed saline spray.  Hemoglobin overall stable although  Had some acute blood loss anemia from 14.3 to 11.8 g.. Recent Labs  Lab 05/16/20 0810 05/17/20 0155 05/18/20 0828 05/19/20 1808 05/20/20 0322  HGB 14.3 14.1 12.9 11.7* 11.8*  HCT 43.5 42.8 39.1 35.4* 35.8*   Chronic A. fib with RVR: Rate  currently controlled, patient has been hypotensive overnight but today blood pressure stable continue her Cardizem, metoprolol.  I am holding losartan.  There is question whether patient was taking her medication appropriately given that she presented with hypertension and uncontrolled RVR-since starting her meds blood pressure has been soft now.  Hypotension/soft blood pressure: Stopped her losartan continue Cardizem metoprolol for A. fib.  With IV fluids blood pressure has improved and will stop IV fluids  Lethargy/possible acute metabolic encephalopathy in the setting of soft blood pressure hypotension dehydration:Appears much more alert awake today with fluids.  Wean off IV fluids given her chronic diastolic CHF.ABG showed no CO2 retention  WVP:XTGGYIR creatinine was elevated to 1.3 form 0.8-subsequently improved with IV fluids. Recent Labs  Lab 05/16/20 0810 05/17/20 0155 05/19/20 1808 05/20/20 0322  BUN 19 14 28* 26*  CREATININE 0.76 0.82 1.39* 1.00   Acute pulmonary insufficiency needing supplemental oxygen wean oxygen as tolerated chest x-ray done this morning no acute significant finding.  Anxiety/depression continue Zoloft.  Physical deconditioning: Patient appears much more alert awake will continue to PT OT, PT assisted skilled nursing first yesterday patient may be agreeable.  Morbid obesity with BMI 33 will benefit weight loss.  History of OSA intolerant to CPAP.  ABG shows no CO2 retention.  Diet Order            Diet Heart Room service appropriate? No; Fluid consistency: Thin  Diet effective now                Patient's Body mass index is 33.31 kg/m.  DVT prophylaxis: SCDs  Start: 05/16/20 1059 Code Status:   Code Status: Full Code  Family Communication: plan of care discussed with patient at bedside.  Status is: Inpatient  Remains inpatient appropriate because:Unsafe d/c plan and Inpatient level of care appropriate due to severity of illness  Dispo: The  patient is from: Home              Anticipated d/c is to: SNF              Patient currently is not medically stable to d/c.   Difficult to place patient No       Unresulted Labs (From admission, onward)          Start     Ordered   05/20/20 9833  Basic metabolic panel  Daily,   R     Question:  Specimen collection method  Answer:  Lab=Lab collect   05/19/20 1701   05/20/20 0500  CBC  Daily,   R     Question:  Specimen collection method  Answer:  Lab=Lab collect   05/19/20 1701          Medications reviewed:  Scheduled Meds: . Chlorhexidine Gluconate Cloth  6 each Topical Daily  . diltiazem  360 mg Oral Daily  . metoprolol succinate  100 mg Oral Daily  . sertraline  200 mg Oral QHS  . sodium chloride  2 spray Each Nare Q2H while awake   Continuous Infusions: . sodium chloride 50 mL/hr at 05/19/20 2102    Consultants:see note  Procedures:see note  Antimicrobials: Anti-infectives (From admission, onward)   Start     Dose/Rate Route Frequency Ordered Stop   05/17/20 1200  cephALEXin (KEFLEX) capsule 500 mg        500 mg Oral Every 8 hours 05/17/20 0958 05/19/20 2040   05/16/20 1200  cefTRIAXone (ROCEPHIN) 1 g in sodium chloride 0.9 % 100 mL IVPB  Status:  Discontinued        1 g 200 mL/hr over 30 Minutes Intravenous Every 24 hours 05/16/20 1157 05/17/20 0958     Culture/Microbiology    Component Value Date/Time   SDES URINE, RANDOM 02/20/2017 2058   SPECREQUEST NONE 02/20/2017 2058   CULT MULTIPLE SPECIES PRESENT, SUGGEST RECOLLECTION (A) 02/20/2017 2058   REPTSTATUS 02/22/2017 FINAL 02/20/2017 2058    Other culture-see note  Objective: Vitals: Today's Vitals   05/20/20 0024 05/20/20 0357 05/20/20 0500 05/20/20 0809  BP:  (!) 101/54  (!) 128/118  Pulse:  98  66  Resp:  18  18  Temp:  98.8 F (37.1 C)  97.8 F (36.6 C)  TempSrc:  Oral  Axillary  SpO2:  94%    Weight: 85.3 kg  85.3 kg   Height:      PainSc:        Intake/Output Summary (Last 24  hours) at 05/20/2020 1154 Last data filed at 05/20/2020 0410 Gross per 24 hour  Intake 504.17 ml  Output 100 ml  Net 404.17 ml   Filed Weights   05/19/20 1503 05/20/20 0024 05/20/20 0500  Weight: 85.8 kg 85.3 kg 85.3 kg   Weight change: 0.9 kg  Intake/Output from previous day: 04/08 0701 - 04/09 0700 In: 704.2 [P.O.:440; I.V.:264.2] Out: 100 [Urine:100] Intake/Output this shift: No intake/output data recorded. Filed Weights   05/19/20 1503 05/20/20 0024 05/20/20 0500  Weight: 85.8 kg 85.3 kg 85.3 kg    Examination: General exam: AAOx3 ,NAD, weak appearing. HEENT:Oral mucosa moist, Ear/Nose WNL grossly,dentition normal. Respiratory system:  bilaterally diminished,no use of accessory muscle, non tender. Cardiovascular system: S1 & S2 +, irregular, No JVD. Gastrointestinal system: Abdomen soft, NT,ND, BS+. Nervous System:Alert, awake, moving extremities and grossly nonfocal Extremities: No edema, distal peripheral pulses palpable.  Skin: No rashes,no icterus. MSK: Normal muscle bulk,tone, power  Data Reviewed: I have personally reviewed following labs and imaging studies CBC: Recent Labs  Lab 05/16/20 0200 05/16/20 0810 05/17/20 0155 05/18/20 0828 05/19/20 1808 05/20/20 0322  WBC 6.0 6.9 11.7*  --  7.4 6.5  NEUTROABS  --  4.6  --   --   --   --   HGB 15.4* 14.3 14.1 12.9 11.7* 11.8*  HCT 46.2* 43.5 42.8 39.1 35.4* 35.8*  MCV 90.1 92.2 92.0  --  93.4 92.0  PLT 234 216 277  --  219 856   Basic Metabolic Panel: Recent Labs  Lab 05/16/20 0810 05/17/20 0155 05/19/20 1808 05/20/20 0322  NA 141 141 135 137  K 3.4* 3.6 3.5 3.5  CL 110 109 101 104  CO2 25 23 30 25   GLUCOSE 116* 136* 123* 106*  BUN 19 14 28* 26*  CREATININE 0.76 0.82 1.39* 1.00  CALCIUM 9.4 9.9 9.4 9.2  MG 2.0  --   --   --    GFR: Estimated Creatinine Clearance: 50.3 mL/min (by C-G formula based on SCr of 1 mg/dL). Liver Function Tests: Recent Labs  Lab 05/19/20 1808  AST 12*  ALT 16   ALKPHOS 45  BILITOT 0.8  PROT 4.9*  ALBUMIN 2.6*   No results for input(s): LIPASE, AMYLASE in the last 168 hours. No results for input(s): AMMONIA in the last 168 hours. Coagulation Profile: Recent Labs  Lab 05/16/20 0810  INR 2.6*   Cardiac Enzymes: No results for input(s): CKTOTAL, CKMB, CKMBINDEX, TROPONINI in the last 168 hours. BNP (last 3 results) No results for input(s): PROBNP in the last 8760 hours. HbA1C: No results for input(s): HGBA1C in the last 72 hours. CBG: Recent Labs  Lab 05/18/20 0725  GLUCAP 162*   Lipid Profile: No results for input(s): CHOL, HDL, LDLCALC, TRIG, CHOLHDL, LDLDIRECT in the last 72 hours. Thyroid Function Tests: No results for input(s): TSH, T4TOTAL, FREET4, T3FREE, THYROIDAB in the last 72 hours. Anemia Panel: No results for input(s): VITAMINB12, FOLATE, FERRITIN, TIBC, IRON, RETICCTPCT in the last 72 hours. Sepsis Labs: No results for input(s): PROCALCITON, LATICACIDVEN in the last 168 hours.  Recent Results (from the past 240 hour(s))  MRSA PCR Screening     Status: None   Collection Time: 05/16/20  2:59 PM   Specimen: Nasopharyngeal  Result Value Ref Range Status   MRSA by PCR NEGATIVE NEGATIVE Final    Comment:        The GeneXpert MRSA Assay (FDA approved for NASAL specimens only), is one component of a comprehensive MRSA colonization surveillance program. It is not intended to diagnose MRSA infection nor to guide or monitor treatment for MRSA infections. Performed at [facility], [place] [address], [place], [place] [address]   Resp Panel by RT-PCR (Flu A&B, Covid) Nasopharyngeal Swab     Status: None   Collection Time: 05/16/20  3:56 PM   Specimen: Nasopharyngeal Swab; Nasopharyngeal(NP) swabs in vial transport medium  Result Value Ref Range Status   SARS Coronavirus 2 by RT PCR NEGATIVE NEGATIVE Final    Comment: (NOTE) SARS-CoV-2 target nucleic acids are NOT DETECTED.  The SARS-CoV-2 RNA is generally  detectable in upper respiratory specimens during the acute  phase of infection. The lowest concentration of SARS-CoV-2 viral copies this assay can detect is 138 copies/mL. A negative result does not preclude SARS-Cov-2 infection and should not be used as the sole basis for treatment or other patient management decisions. A negative result may occur with  improper specimen collection/handling, submission of specimen other than nasopharyngeal swab, presence of viral mutation(s) within the areas targeted by this assay, and inadequate number of viral copies(<138 copies/mL). A negative result must be combined with clinical observations, patient history, and epidemiological information. The expected result is Negative.  Fact Sheet for Patients:  EntrepreneurPulse.com.au  Fact Sheet for Healthcare Providers:  IncredibleEmployment.be  This test is no t yet approved or cleared by the [place] FDA and  has been authorized for detection and/or diagnosis of SARS-CoV-2 by FDA under an Emergency Use Authorization (EUA). This EUA will remain  in effect (meaning this test can be used) for the duration of the COVID-19 declaration under Section 564(b)(1) of the Act, 21 U.S.C.section 360bbb-3(b)(1), unless the authorization is terminated  or revoked sooner.       Influenza A by PCR NEGATIVE NEGATIVE Final   Influenza B by PCR NEGATIVE NEGATIVE Final    Comment: (NOTE) The Xpert Xpress SARS-CoV-2/FLU/RSV plus assay is intended as an aid in the diagnosis of influenza from Nasopharyngeal swab specimens and should not be used as a sole basis for treatment. Nasal washings and aspirates are unacceptable for Xpert Xpress SARS-CoV-2/FLU/RSV testing.  Fact Sheet for Patients: EntrepreneurPulse.com.au  Fact Sheet for Healthcare Providers: IncredibleEmployment.be  This test is not yet approved or cleared by the [place] FDA  and has been authorized for detection and/or diagnosis of SARS-CoV-2 by FDA under an Emergency Use Authorization (EUA). This EUA will remain in effect (meaning this test can be used) for the duration of the COVID-19 declaration under Section 564(b)(1) of the Act, 21 U.S.C. section 360bbb-3(b)(1), unless the authorization is terminated or revoked.  Performed at [facility], [place] [address], [place], [place] [address]      Radiology Studies: DG Chest [place] 1 View  Result Date: 05/20/2020 CLINICAL DATA:  75 year old female with history of lethargy, confusion and shortness of breath. EXAM: PORTABLE CHEST 1 VIEW COMPARISON:  Chest x-ray 10/24/2017. FINDINGS: Lung volumes are low. No consolidative airspace disease. Linear scarring in the left mid to lower lung, similar to the prior study. No pleural effusions. No pneumothorax. No pulmonary nodule or mass noted. Mild cardiomegaly. Upper mediastinal contours are within normal limits allowing for patient positioning. Atherosclerotic calcifications in the thoracic aorta. IMPRESSION: 1. Low lung volumes without radiographic evidence of acute cardiopulmonary disease. 2. Aortic atherosclerosis. 3. Mild cardiomegaly. Electronically Signed   By: Vinnie Langton M.D.   On: 05/20/2020 09:30     LOS: 4 days   Antonieta Pert, MD [facility]  05/20/2020, 11:54 AM

## 2020-05-20 NOTE — Plan of Care (Signed)

## 2020-05-21 ENCOUNTER — Other Ambulatory Visit: Payer: Self-pay

## 2020-05-21 DIAGNOSIS — R040 Epistaxis: Secondary | ICD-10-CM | POA: Diagnosis not present

## 2020-05-21 LAB — BASIC METABOLIC PANEL
Anion gap: 3 — ABNORMAL LOW (ref 5–15)
BUN: 17 mg/dL (ref 8–23)
CO2: 28 mmol/L (ref 22–32)
Calcium: 8.9 mg/dL (ref 8.9–10.3)
Chloride: 107 mmol/L (ref 98–111)
Creatinine, Ser: 0.81 mg/dL (ref 0.44–1.00)
GFR, Estimated: >60 mL/min (ref >60)
Glucose, Bld: 98 mg/dL (ref 70–99)
Potassium: 3.5 mmol/L (ref 3.5–5.1)
Sodium: 138 mmol/L (ref 135–145)

## 2020-05-21 LAB — CBC
HCT: 36 % (ref 36.0–46.0)
Hemoglobin: 12 g/dL (ref 12.0–15.0)
MCH: 30.4 pg (ref 26.0–34.0)
MCHC: 33.3 g/dL (ref 30.0–36.0)
MCV: 91.1 fL (ref 80.0–100.0)
Platelets: 236 10*3/uL (ref 150–400)
RBC: 3.95 MIL/uL (ref 3.87–5.11)
RDW: 15.3 % (ref 11.5–15.5)
WBC: 5.4 10*3/uL (ref 4.0–10.5)
nRBC: 0 % (ref 0.0–0.2)

## 2020-05-21 LAB — GLUCOSE, CAPILLARY: Glucose-Capillary: 103 mg/dL — ABNORMAL HIGH (ref 70–99)

## 2020-05-21 NOTE — Progress Notes (Signed)
Physical Therapy Treatment Patient Details Name: Beth Haynes MRN: [id number] DOB: August 09, 1945 Today's Date: 05/21/2020    History of Present Illness 75 y.o. female presents to [facility] ED on 05/16/2020 with R epistaxis, multiple attempts to stop bleeding were unsuccessful, and pt was referred for ENT consult. ENT placed epistat catheter on 4/5 with resolution of bleeding. Epistat removed 4.7, no further nose bleeding noted. PMH includes OSA, HTN, depression, CHF, Afib, ICH, HLD.    PT Comments    Pt reports feeling better today and was agreeable to participating in therapy. Pt demonstrated progression in bed mobility requiring less assistance compared to last session. Pt was able to stand from seated position and begin ambulation towards hall without an RW. Pt showed a preference for using the R hand rail during gait, prompting therapist to give the pt a RW. Pt continues to veer to the R when using RW, requiring verbal and manual cues to correct. Pt was limited in distance ambulated during today's session secondary to reported fatigue. Pt continues to appear easily distracted and has poor awareness of current deficits, increasing her risk of falling. Pt will continue to benefit from acute PT to address remaining limitations in functional mobility to be independent in the home.    Follow Up Recommendations  Home health PT     Equipment Recommendations  None recommended by PT (Pt is recommended to use walker for all OOB mobility.)    Recommendations for Other Services       Precautions / Restrictions Precautions Precautions: Fall Precaution Comments: monitor BP Restrictions Weight Bearing Restrictions: No    Mobility  Bed Mobility Overal bed mobility: Needs Assistance Bed Mobility: Supine to Sit     Supine to sit: Min guard          Transfers Overall transfer level: Needs assistance Equipment used: Rolling walker (2 wheeled) Transfers: Sit to/from Stand Sit to Stand: Min guard          General transfer comment: assist for hand placement  Ambulation/Gait Ambulation/Gait assistance: Min assist (to steer walker, preference for veering R with walker) Gait Distance (Feet): 100 Feet Assistive device: Rolling walker (2 wheeled) (Initally attempted without AD, pt was reaching for hand rails on side of hallway) Gait Pattern/deviations: Step-through pattern;Drifts right/left;Decreased stride length Gait velocity: reduced Gait velocity interpretation: <1.31 ft/sec, indicative of household Metallurgist Rankin (Stroke Patients Only)       Balance Overall balance assessment: Needs assistance Sitting-balance support: No upper extremity supported;Feet supported Sitting balance-Leahy Scale: Fair     Standing balance support: No upper extremity supported Standing balance-Leahy Scale: Fair Standing balance comment: requires RW or hand held assist                            Cognition Arousal/Alertness: Awake/alert Behavior During Therapy: WFL for tasks assessed/performed Overall Cognitive Status: History of cognitive impairments - at baseline Area of Impairment: Memory;Problem solving;Safety/judgement                 Orientation Level: Disoriented to;Situation Current Attention Level: Focused Memory: Decreased short-term memory       Problem Solving: Slow processing;Requires verbal cues General Comments: Pt aware today of situation, but requires cues from spouse to continue to move as pt appears to live a sedentary lifestyle.      Exercises  General Comments General comments (skin integrity, edema, etc.): Pt's incontinent of urine.      Pertinent Vitals/Pain Pain Assessment: No/denies pain    Home Living Family/patient expects to be discharged to:: Private residence Living Arrangements: Spouse/significant other Available Help at Discharge: Family;Available 24  hours/day Type of Home: House Home Access: Stairs to enter   Home Layout: Two level Home Equipment: Walker - 2 wheels;Grab bars - tub/shower Additional Comments: Pt's spouse in room to confirm data    Prior Function Level of Independence: Independent      Comments: Pt and spouse report independence with ADL and mobility at home.   PT Goals (current goals can now be found in the care plan section) Acute Rehab PT Goals Patient Stated Goal: To be able to walk and to go home Progress towards PT goals: Progressing toward goals    Frequency    Min 3X/week      PT Plan Current plan remains appropriate    Co-evaluation              AM-PAC PT "6 Clicks" Mobility   Outcome Measure  Help needed turning from your back to your side while in a flat bed without using bedrails?: None Help needed moving from lying on your back to sitting on the side of a flat bed without using bedrails?: None Help needed moving to and from a bed to a chair (including a wheelchair)?: A Little Help needed standing up from a chair using your arms (e.g., wheelchair or bedside chair)?: A Little Help needed to walk in hospital room?: A Little Help needed climbing 3-5 steps with a railing? : A Little 6 Click Score: 20    End of Session Equipment Utilized During Treatment: Gait belt Activity Tolerance: Patient limited by fatigue Patient left: in chair;with call bell/phone within reach;with chair alarm set Nurse Communication: Mobility status PT Visit Diagnosis: Other abnormalities of gait and mobility (R26.89);Difficulty in walking, not elsewhere classified (R26.2);Muscle weakness (generalized) (M62.81)     Time: 3716-9678 PT Time Calculation (min) (ACUTE ONLY): 34 min  Charges:  $Gait Training: 8-22 mins $Therapeutic Activity: 8-22 mins                     Acute Rehab  Pager: [phone]    Garwin Brothers, SPT 05/21/2020, 5:51 PM

## 2020-05-21 NOTE — TOC Initial Note (Signed)
Transition of Care [facility]) - Initial/Assessment Note    Patient Details  Name: Beth Haynes MRN: [id number] Date of Birth: [birth year]  [facility] CM/SW Contact:    Ina Homes, [place] Phone Number: 05/21/2020, 10:48 AM  Clinical Narrative:                  SW met with pt and husband Nadara Mustard [phone]), pt confirmed demographics. Pt lives in single family home with husband. Pt has steps to basement. Pt's husband able to transport at d/c. Pt preferred pharmacy is [facility] on [address]. Pt reports hx with [facility] services but unsure of agency. Pt reports no SNF hx. Pt is COVID vaccinated. SW explained current PT recs for SNF. Pt declined SNF placement and [place] services. Pt's husband plans to check what DME is available in home and requested TOC to f/u Monday 4/11 to confirm has needed DME.   Expected Discharge Plan: Home/Self Care Barriers to Discharge: Continued Medical Work up   Patient Goals and CMS Choice Patient states their goals for this hospitalization and ongoing recovery are:: going home with DME      Expected Discharge Plan and Services Expected Discharge Plan: Home/Self Care       Living arrangements for the past 2 months: Single Family Home                                      Prior Living Arrangements/Services Living arrangements for the past 2 months: Single Family Home Lives with:: Spouse Patient language and need for interpreter reviewed:: Yes Do you feel safe going back to the place where you live?: Yes            Criminal Activity/Legal Involvement Pertinent to Current Situation/Hospitalization: No - Comment as needed  Activities of Daily Living Home Assistive Devices/Equipment: None ADL Screening (condition at time of admission) Patient's cognitive ability adequate to safely complete daily activities?: Yes Is the patient deaf or have difficulty hearing?: No Does the patient have difficulty seeing, even when wearing glasses/contacts?:  No Does the patient have difficulty concentrating, remembering, or making decisions?: Yes Patient able to express need for assistance with ADLs?: Yes Does the patient have difficulty dressing or bathing?: No Independently performs ADLs?: Yes (appropriate for developmental age) Does the patient have difficulty walking or climbing stairs?: Yes Weakness of Legs: Both Weakness of Arms/Hands: Both  Permission Sought/Granted Permission sought to share information with : Case Manager,Family Supports Permission granted to share information with : Yes, Verbal Permission Granted  Share Information with NAME: Nadara Mustard     Permission granted to share info w Relationship: Husband  Permission granted to share info w Contact Information: [phone]  Emotional Assessment Appearance:: Appears stated age Attitude/Demeanor/Rapport: Engaged Affect (typically observed): Accepting,Adaptable,Calm,Pleasant Orientation: : Oriented to Self,Oriented to  Time,Oriented to Place,Oriented to Situation      Admission diagnosis:  Epistaxis [R04.0] Right-sided epistaxis [R04.0] Patient Active Problem List   Diagnosis Date Noted  . Epistaxis 05/16/2020  . Right-sided epistaxis   . Anxiety 04/26/2019  . Hyperlipidemia 04/26/2019  . 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 Paroxysmal atrial fibrillation ([place]) 05/06/2017  . Intolerance of continuous positive airway pressure (CPAP) ventilation 05/06/2017  . 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 Persistent atrial fibrillation ([place]) 02/22/2017  . Depression 02/20/2017  . Chronic diastolic CHF (congestive heart failure) ([place]) 02/20/2017  . Obstructive sleep apnea hypopnea, severe 06/12/2016  .  Obesity (BMI 30-39.9) 07/11/2015  . OSA (obstructive sleep apnea) 01/17/2015  . HTN (hypertension) 01/17/2015   PCP:  Velna Hatchet, MD Pharmacy:   [facility], [address], [address] [place], [place]  [address] Phone: [phone] Fax: [place] [address], [address] AT [facility] [place] [address] Phone: [phone] Fax: [phone]     Social Determinants of Health ([place]) Interventions    Readmission Risk Interventions No flowsheet data found.

## 2020-05-21 NOTE — Plan of Care (Signed)

## 2020-05-21 NOTE — Progress Notes (Signed)
    Pt was admitted with epistaxis Is on xarelto for atrial fib Had nose packed by Dr. Redmond Baseman. Recommended holding DOAC for several days  Would consider restarting Eliquis 5 mg po BID  - this may have a slighlty lower risk of bleeding   We will see her in the office at her previously scheduled appt.     Mertie Moores, MD  05/21/2020 10:22 AM    [facility],  [place], [place]  [address] Phone: [phone]; Fax: [phone]

## 2020-05-21 NOTE — Evaluation (Signed)
Occupational Therapy Evaluation Patient Details Name: Beth Haynes MRN: [id number] DOB: 1945/04/26 Today's Date: 05/21/2020    History of Present Illness 75 y.o. female presents to [facility] ED on 05/16/2020 with R epistaxis, multiple attempts to stop bleeding were unsuccessful, and pt was referred for ENT consult. ENT placed epistat catheter on 4/5 with resolution of bleeding. Epistat removed 4.7, no further nose bleeding noted. PMH includes OSA, HTN, depression, CHF, Afib, ICH, HLD.   Clinical Impression   Pt PTA: pt living at home with spouse and reports independence with ADL and spouse appears supportive to assist as needed. Pt currently, standing at commode for toilet hygiene and standing at sink for ADL routine without physical assist. Pt holding onto grab bars in BA and requiring cues for hand placement. Pt fixing socks at EOB and mobility with and without RW for stability. Pt atleast requires 1 HHA. Spouse appears supportive. Pt's BP stable; O2>90% on RA, 75 BPM. Pt ambulating 50' and then 25' with RW/HHA.    Follow Up Recommendations  No OT follow up;Supervision - Intermittent    Equipment Recommendations  3 in 1 bedside commode    Recommendations for Other Services       Precautions / Restrictions Precautions Precautions: Fall Precaution Comments: monitor BP Restrictions Weight Bearing Restrictions: No      Mobility Bed Mobility Overal bed mobility: Needs Assistance Bed Mobility: Supine to Sit     Supine to sit: Min guard          Transfers Overall transfer level: Needs assistance Equipment used: Rolling walker (2 wheeled) Transfers: Sit to/from Stand Sit to Stand: Min guard         General transfer comment: assist for hand placement    Balance Overall balance assessment: Needs assistance Sitting-balance support: No upper extremity supported;Feet unsupported Sitting balance-Leahy Scale: Fair     Standing balance support: Bilateral upper extremity  supported Standing balance-Leahy Scale: Poor Standing balance comment: requires RW or hand held assist                           ADL either performed or assessed with clinical judgement   ADL Overall ADL's : At baseline                                       General ADL Comments: Pt standing at commode for toilet hygiene and standing at sink for ADL routine without physical assist. Pt holding onto grab bars in BA and requiring cues for hand placement. Pt fixing socks at EOB and mobility with and without RW for stability. Pt atleats requires 1 HHA. Spouse appears supportive.     Vision Baseline Vision/History: No visual deficits Patient Visual Report: No change from baseline Vision Assessment?: Yes Eye Alignment: Within Functional Limits Ocular Range of Motion: Within Functional Limits Alignment/Gaze Preference: Within Defined Limits Additional Comments: Pt not bumping into objects on either side today     Perception     Praxis      Pertinent Vitals/Pain Pain Assessment: No/denies pain     Hand Dominance Right   Extremity/Trunk Assessment Upper Extremity Assessment Upper Extremity Assessment: Generalized weakness   Lower Extremity Assessment Lower Extremity Assessment: Generalized weakness   Cervical / Trunk Assessment Cervical / Trunk Assessment: Kyphotic   Communication Communication Communication: No difficulties   Cognition Arousal/Alertness: Awake/alert Behavior During Therapy: WFL for  tasks assessed/performed Overall Cognitive Status: History of cognitive impairments - at baseline Area of Impairment: Orientation;Memory;Problem solving                 Orientation Level: Disoriented to;Situation;Time   Memory: Decreased short-term memory       Problem Solving: Slow processing;Requires verbal cues;Requires tactile cues General Comments: Pt aware today of situation, but requires cues from spouse to continue to move as pt  appears to live a sedentary lifestyle.   General Comments  BP stable; O2>90% on RA, 75 BPM. Pt ambulating 50' and then 25' with RW/HHA.    Exercises     Shoulder Instructions      Home Living Family/patient expects to be discharged to:: Private residence Living Arrangements: Spouse/significant other Available Help at Discharge: Family;Available 24 hours/day Type of Home: House Home Access: Stairs to enter     Home Layout: Two level     Bathroom Shower/Tub: Teacher, early years/pre: Standard     Home Equipment: Environmental consultant - 2 wheels;Grab bars - tub/shower   Additional Comments: Pt's spouse in room to confirm data      Prior Functioning/Environment Level of Independence: Independent        Comments: Pt and spouse report independence with ADL and mobility at home.        OT Problem List: Decreased strength;Decreased activity tolerance;Impaired balance (sitting and/or standing);Decreased cognition;Decreased safety awareness;Pain;Cardiopulmonary status limiting activity      OT Treatment/Interventions: Self-care/ADL training;Therapeutic exercise;Energy conservation;DME and/or AE instruction;Therapeutic activities;Cognitive remediation/compensation;Patient/family education;Visual/perceptual remediation/compensation;Balance training    OT Goals(Current goals can be found in the care plan section) Acute Rehab OT Goals Patient Stated Goal: increase activity tolerance OT Goal Formulation: With patient/family Time For Goal Achievement: 06/04/20 Potential to Achieve Goals: Good  OT Frequency: Min 2X/week   Barriers to D/C:            Co-evaluation              AM-PAC OT "6 Clicks" Daily Activity     Outcome Measure Help from another person eating meals?: None Help from another person taking care of personal grooming?: A Little Help from another person toileting, which includes using toliet, bedpan, or urinal?: A Little Help from another person bathing  (including washing, rinsing, drying)?: A Little Help from another person to put on and taking off regular upper body clothing?: None Help from another person to put on and taking off regular lower body clothing?: A Little 6 Click Score: 20   End of Session Equipment Utilized During Treatment: Gait belt;Rolling walker Nurse Communication: Mobility status  Activity Tolerance: Patient tolerated treatment well Patient left: in bed;with call bell/phone within reach;with bed alarm set;with family/visitor present  OT Visit Diagnosis: Unsteadiness on feet (R26.81);Muscle weakness (generalized) (M62.81);Other symptoms and signs involving cognitive function                Time: 9211-9417 OT Time Calculation (min): 29 min Charges:  OT General Charges $OT Visit: 1 Visit OT Evaluation $OT Eval Moderate Complexity: 1 Mod OT Treatments $Self Care/Home Management : 8-22 mins  Jefferey Pica, OTR/L Acute Rehabilitation Services Pager: [phone] Office: [phone]   [place] Monds C 05/21/2020, 2:54 PM

## 2020-05-21 NOTE — Progress Notes (Signed)
PROGRESS NOTE    Beth Haynes  RUE:[id number] DOB: 10/24/1945 DOA: 05/16/2020 PCP: Velna Hatchet, MD   Chief Complaint  Patient presents with  . Epistaxis  Brief Narrative: 75 old female with history of chronic A. fib on Eliquis, OSA-CPAP intolerant, HTN morbid obesity, depression/anxiety, chronic diastolic CHF, hyperlipidemia presented to [facility] P4/5 with right-sided epistaxis.  Multiple attempts to control bleeding failed including cautery and packing and transferred to [facility] seen by ENT Epistat was placed and patient also had A. fib with RVR and hypotensive needing Cardizem drip and ICU admission. Her hemoglobin remained stable overall although some drop.  Monitor in ICU, on 4/8 nasal packing was removed.  Patient appears fairly ill frail needing PT eval blood pressure soft so discharge was held and transferred to [facility] service. Patient has been lethargic sleepy sometimes confused some- BP has been soft, and labs showed AKI AKI was given iv boluses with improvement in her symptoms.  Subjective: Patient is alert awake oriented.  She is on room air. No fever overnight.  Blood pressure stable. Saturating well on room air Nurse reports at times somewhat forgetful  Assessment & Plan: Right-sided severe epistaxis in the setting of anticoagulation.  Epistal removed  By ENT 4/8. ENT advised to hold patient's Xarelto for couple of days likely resume on 4/11.  No nose blowing for 2 weeks and as needed saline spray.  Hemoglobin overall stable although  Had some acute blood loss anemia from 14.3 to 11.8 g > 12gm stable Recent Labs  Lab 05/17/20 0155 05/18/20 0828 05/19/20 1808 05/20/20 0322 05/21/20 0213  HGB 14.1 12.9 11.7* 11.8* 12.0  HCT 42.8 39.1 35.4* 35.8* 36.0   Chronic A. fib with RVR: Rate currently controlled, patient has been hypotensive/soft BP-that improved with IV fluid bolus and after stopping losartan.  Cont on her Cardizem, metoprolol.There is question whether patient was  taking her medication appropriately given that she presented with hypertension and uncontrolled RVR-since starting her meds blood pressure has been soft now.  Xarelto on hold-hopefully resume tomorrow.  She would like to discuss with her cardiology about her Xarelto-sent message to Dr. Acie Fredrickson.  Hypotension/soft blood pressure: improved with IV fluids.  Stopping losartan.  Tolerating Cardizem metoprolol for A. Fib.  Lethargy/possible acute metabolic encephalopathy in the setting of soft blood pressure hypotension dehydration: This has resolved with improvement in blood pressure and with IV hydration.ABG showed no CO2 retention,  JYN:WGNFAOZ creatinine was elevated to 1.3 from 0.8-subsequently improved with IV fluids.  Monitor Recent Labs  Lab 05/16/20 0810 05/17/20 0155 05/19/20 1808 05/20/20 0322 05/21/20 0213  BUN 19 14 28* 26* 17  CREATININE 0.76 0.82 1.39* 1.00 0.81   Acute pulmonary insufficiency needing supplemental oxygen wean oxygen as tolerated chest x-ray done 4/9 morning no acute significant finding, patient hypoxia improved.  Anxiety/depression stable on Zoloft.  Physical deconditioning: Patient appears much more alert awake will continue to PT OT, PT assisted skilled nursing first yesterday patient may be agreeable.  Morbid obesity with BMI 33 will benefit weight loss.  History of OSA intolerant to CPAP.  ABG shows no CO2 retention.  Diet Order            Diet Heart Room service appropriate? No; Fluid consistency: Thin  Diet effective now                Patient's Body mass index is 33.59 kg/m.  DVT prophylaxis: SCDs Start: 05/16/20 1059 Code Status:   Code Status: Full Code  Family  Communication: plan of care discussed with patient at bedside. Unable to reach the husband this morning. He Is very hard of hearing.  Status is: Inpatient Remains inpatient appropriate because:Unsafe d/c plan and Inpatient level of care appropriate due to severity of illness Dispo:  The patient is from: Home              Anticipated d/c is to: SNF, once bed available              Patient currently is medically stable for discharge   Difficult to place patient No  Unresulted Labs (From admission, onward)          Start     Ordered   05/20/20 3536  Basic metabolic panel  Daily,   R     Question:  Specimen collection method  Answer:  Lab=Lab collect   05/19/20 1701   05/20/20 0500  CBC  Daily,   R     Question:  Specimen collection method  Answer:  Lab=Lab collect   05/19/20 1701          Medications reviewed:  Scheduled Meds: . Chlorhexidine Gluconate Cloth  6 each Topical Daily  . diltiazem  360 mg Oral Daily  . metoprolol succinate  100 mg Oral Daily  . sertraline  200 mg Oral QHS  . sodium chloride  2 spray Each Nare Q2H while awake   Continuous Infusions:   Consultants:see note  Procedures:see note  Antimicrobials: Anti-infectives (From admission, onward)   Start     Dose/Rate Route Frequency Ordered Stop   05/17/20 1200  cephALEXin (KEFLEX) capsule 500 mg        500 mg Oral Every 8 hours 05/17/20 0958 05/19/20 2040   05/16/20 1200  cefTRIAXone (ROCEPHIN) 1 g in sodium chloride 0.9 % 100 mL IVPB  Status:  Discontinued        1 g 200 mL/hr over 30 Minutes Intravenous Every 24 hours 05/16/20 1157 05/17/20 0958     Culture/Microbiology    Component Value Date/Time   SDES URINE, RANDOM 02/20/2017 2058   SPECREQUEST NONE 02/20/2017 2058   CULT MULTIPLE SPECIES PRESENT, SUGGEST RECOLLECTION (A) 02/20/2017 2058   REPTSTATUS 02/22/2017 FINAL 02/20/2017 2058    Other culture-see note  Objective: Vitals: Today's Vitals   05/20/20 2300 05/21/20 0015 05/21/20 0400 05/21/20 0447  BP:  119/76  120/69  Pulse:  93  96  Resp:  18  18  Temp:  (!) 97.4 F (36.3 C)  97.8 F (36.6 C)  TempSrc:  Oral  Oral  SpO2:  95%  95%  Weight:  86 kg    Height:      PainSc: 0-No pain  0-No pain     Intake/Output Summary (Last 24 hours) at 05/21/2020  0738 Last data filed at 05/21/2020 0449 Gross per 24 hour  Intake 1260 ml  Output 1050 ml  Net 210 ml   Filed Weights   05/20/20 0024 05/20/20 0500 05/21/20 0015  Weight: 85.3 kg 85.3 kg 86 kg   Weight change: 0.2 kg  Intake/Output from previous day: 04/09 0701 - 04/10 0700 In: 1260 [P.O.:860; I.V.:400] Out: 1050 [Urine:1050] Intake/Output this shift: No intake/output data recorded. Filed Weights   05/20/20 0024 05/20/20 0500 05/21/20 0015  Weight: 85.3 kg 85.3 kg 86 kg    Examination: General exam: AAOx3 , NAD, weak appearing. HEENT:Oral mucosa moist, Ear/Nose WNL grossly, dentition normal. Respiratory system: bilaterally clear,no wheezing or crackles,no use of accessory muscle Cardiovascular system: S1 &  S2 +, No JVD,. Gastrointestinal system: Abdomen soft, NT,ND, BS+ Nervous System:Alert, awake, moving extremities and grossly nonfocal Extremities: No edema, distal peripheral pulses palpable.  Skin: No rashes,no icterus. MSK: Normal muscle bulk,tone, power  Data Reviewed: I have personally reviewed following labs and imaging studies CBC: Recent Labs  Lab 05/16/20 0810 05/17/20 0155 05/18/20 0828 05/19/20 1808 05/20/20 0322 05/21/20 0213  WBC 6.9 11.7*  --  7.4 6.5 5.4  NEUTROABS 4.6  --   --   --   --   --   HGB 14.3 14.1 12.9 11.7* 11.8* 12.0  HCT 43.5 42.8 39.1 35.4* 35.8* 36.0  MCV 92.2 92.0  --  93.4 92.0 91.1  PLT 216 277  --  219 234 629   Basic Metabolic Panel: Recent Labs  Lab 05/16/20 0810 05/17/20 0155 05/19/20 1808 05/20/20 0322 05/21/20 0213  NA 141 141 135 137 138  K 3.4* 3.6 3.5 3.5 3.5  CL 110 109 101 104 107  CO2 25 23 30 25 28   GLUCOSE 116* 136* 123* 106* 98  BUN 19 14 28* 26* 17  CREATININE 0.76 0.82 1.39* 1.00 0.81  CALCIUM 9.4 9.9 9.4 9.2 8.9  MG 2.0  --   --   --   --    GFR: Estimated Creatinine Clearance: 62.3 mL/min (by C-G formula based on SCr of 0.81 mg/dL). Liver Function Tests: Recent Labs  Lab 05/19/20 1808  AST  12*  ALT 16  ALKPHOS 45  BILITOT 0.8  PROT 4.9*  ALBUMIN 2.6*   No results for input(s): LIPASE, AMYLASE in the last 168 hours. No results for input(s): AMMONIA in the last 168 hours. Coagulation Profile: Recent Labs  Lab 05/16/20 0810  INR 2.6*   Cardiac Enzymes: No results for input(s): CKTOTAL, CKMB, CKMBINDEX, TROPONINI in the last 168 hours. BNP (last 3 results) No results for input(s): PROBNP in the last 8760 hours. HbA1C: No results for input(s): HGBA1C in the last 72 hours. CBG: Recent Labs  Lab 05/18/20 0725 05/21/20 0623  GLUCAP 162* 103*   Lipid Profile: No results for input(s): CHOL, HDL, LDLCALC, TRIG, CHOLHDL, LDLDIRECT in the last 72 hours. Thyroid Function Tests: No results for input(s): TSH, T4TOTAL, FREET4, T3FREE, THYROIDAB in the last 72 hours. Anemia Panel: No results for input(s): VITAMINB12, FOLATE, FERRITIN, TIBC, IRON, RETICCTPCT in the last 72 hours. Sepsis Labs: No results for input(s): PROCALCITON, LATICACIDVEN in the last 168 hours.  Recent Results (from the past 240 hour(s))  MRSA PCR Screening     Status: None   Collection Time: 05/16/20  2:59 PM   Specimen: Nasopharyngeal  Result Value Ref Range Status   MRSA by PCR NEGATIVE NEGATIVE Final    Comment:        The GeneXpert MRSA Assay (FDA approved for NASAL specimens only), is one component of a comprehensive MRSA colonization surveillance program. It is not intended to diagnose MRSA infection nor to guide or monitor treatment for MRSA infections. Performed at [facility], [place] [address] [place], [place] [address]   Resp Panel by RT-PCR (Flu A&B, Covid) Nasopharyngeal Swab     Status: None   Collection Time: 05/16/20  3:56 PM   Specimen: Nasopharyngeal Swab; Nasopharyngeal(NP) swabs in vial transport medium  Result Value Ref Range Status   SARS Coronavirus 2 by RT PCR NEGATIVE NEGATIVE Final    Comment: (NOTE) SARS-CoV-2 target nucleic acids are NOT DETECTED.  The  SARS-CoV-2 RNA is generally detectable in upper respiratory specimens  during the acute phase of infection. The lowest concentration of SARS-CoV-2 viral copies this assay can detect is 138 copies/mL. A negative result does not preclude SARS-Cov-2 infection and should not be used as the sole basis for treatment or other patient management decisions. A negative result may occur with  improper specimen collection/handling, submission of specimen other than nasopharyngeal swab, presence of viral mutation(s) within the areas targeted by this assay, and inadequate number of viral copies(<138 copies/mL). A negative result must be combined with clinical observations, patient history, and epidemiological information. The expected result is Negative.  Fact Sheet for Patients:  EntrepreneurPulse.com.au  Fact Sheet for Healthcare Providers:  IncredibleEmployment.be  This test is no t yet approved or cleared by the [place] FDA and  has been authorized for detection and/or diagnosis of SARS-CoV-2 by FDA under an Emergency Use Authorization (EUA). This EUA will remain  in effect (meaning this test can be used) for the duration of the COVID-19 declaration under Section 564(b)(1) of the Act, 21 U.S.C.section 360bbb-3(b)(1), unless the authorization is terminated  or revoked sooner.       Influenza A by PCR NEGATIVE NEGATIVE Final   Influenza B by PCR NEGATIVE NEGATIVE Final    Comment: (NOTE) The Xpert Xpress SARS-CoV-2/FLU/RSV plus assay is intended as an aid in the diagnosis of influenza from Nasopharyngeal swab specimens and should not be used as a sole basis for treatment. Nasal washings and aspirates are unacceptable for Xpert Xpress SARS-CoV-2/FLU/RSV testing.  Fact Sheet for Patients: EntrepreneurPulse.com.au  Fact Sheet for Healthcare Providers: IncredibleEmployment.be  This test is not yet approved or  cleared by the [place] FDA and has been authorized for detection and/or diagnosis of SARS-CoV-2 by FDA under an Emergency Use Authorization (EUA). This EUA will remain in effect (meaning this test can be used) for the duration of the COVID-19 declaration under Section 564(b)(1) of the Act, 21 U.S.C. section 360bbb-3(b)(1), unless the authorization is terminated or revoked.  Performed at [facility], [place] [address] [place], [place] [address]      Radiology Studies: DG Chest [place] 1 View  Result Date: 05/20/2020 CLINICAL DATA:  75 year old female with history of lethargy, confusion and shortness of breath. EXAM: PORTABLE CHEST 1 VIEW COMPARISON:  Chest x-ray 10/24/2017. FINDINGS: Lung volumes are low. No consolidative airspace disease. Linear scarring in the left mid to lower lung, similar to the prior study. No pleural effusions. No pneumothorax. No pulmonary nodule or mass noted. Mild cardiomegaly. Upper mediastinal contours are within normal limits allowing for patient positioning. Atherosclerotic calcifications in the thoracic aorta. IMPRESSION: 1. Low lung volumes without radiographic evidence of acute cardiopulmonary disease. 2. Aortic atherosclerosis. 3. Mild cardiomegaly. Electronically Signed   By: Vinnie Langton M.D.   On: 05/20/2020 09:30     LOS: 5 days   Antonieta Pert, MD [facility]  05/21/2020, 7:38 AM

## 2020-05-22 DIAGNOSIS — R040 Epistaxis: Secondary | ICD-10-CM | POA: Diagnosis not present

## 2020-05-22 LAB — CBC
HCT: 37 % (ref 36.0–46.0)
Hemoglobin: 12.3 g/dL (ref 12.0–15.0)
MCH: 30.7 pg (ref 26.0–34.0)
MCHC: 33.2 g/dL (ref 30.0–36.0)
MCV: 92.3 fL (ref 80.0–100.0)
Platelets: 215 10*3/uL (ref 150–400)
RBC: 4.01 MIL/uL (ref 3.87–5.11)
RDW: 15 % (ref 11.5–15.5)
WBC: 4.5 10*3/uL (ref 4.0–10.5)
nRBC: 0 % (ref 0.0–0.2)

## 2020-05-22 LAB — BASIC METABOLIC PANEL
Anion gap: 3 — ABNORMAL LOW (ref 5–15)
BUN: 15 mg/dL (ref 8–23)
CO2: 27 mmol/L (ref 22–32)
Calcium: 9.2 mg/dL (ref 8.9–10.3)
Chloride: 109 mmol/L (ref 98–111)
Creatinine, Ser: 0.91 mg/dL (ref 0.44–1.00)
GFR, Estimated: >60 mL/min (ref >60)
Glucose, Bld: 115 mg/dL — ABNORMAL HIGH (ref 70–99)
Potassium: 3.6 mmol/L (ref 3.5–5.1)
Sodium: 139 mmol/L (ref 135–145)

## 2020-05-22 MED ORDER — APIXABAN 5 MG PO TABS
5.0000 mg | ORAL_TABLET | Freq: Two times a day (BID) | ORAL | Status: DC
  Administered 2020-05-22 – 2020-05-23 (×3): 5 mg via ORAL
  Filled 2020-05-22 (×3): qty 1

## 2020-05-22 MED ORDER — DILTIAZEM HCL 25 MG/5ML IV SOLN
10.0000 mg | Freq: Once | INTRAVENOUS | Status: DC
  Filled 2020-05-22: qty 5

## 2020-05-22 MED ORDER — MENTHOL 3 MG MT LOZG
1.0000 | LOZENGE | OROMUCOSAL | Status: DC | PRN
  Filled 2020-05-22: qty 9

## 2020-05-22 MED ORDER — LORAZEPAM 2 MG/ML IJ SOLN
1.0000 mg | Freq: Once | INTRAMUSCULAR | Status: AC
  Administered 2020-05-22: 1 mg via INTRAVENOUS
  Filled 2020-05-22: qty 1

## 2020-05-22 NOTE — Progress Notes (Addendum)
PROGRESS NOTE    Beth Haynes  TGG:[id number] DOB: 1945/04/24 DOA: 05/16/2020 PCP: Velna Hatchet, MD   Chief Complaint  Patient presents with  . Epistaxis  Brief Narrative: 74 old female with history of chronic A. fib on Eliquis, OSA-CPAP intolerant, HTN morbid obesity, depression/anxiety, chronic diastolic CHF, hyperlipidemia presented to [facility] P4/5 with right-sided epistaxis.  Multiple attempts to control bleeding failed including cautery and packing and transferred to [facility] seen by ENT Epistat was placed and patient also had A. fib with RVR and hypotensive needing Cardizem drip and ICU admission. Her hemoglobin remained stable overall although some drop.  Monitor in ICU, on 4/8 nasal packing was removed.  Patient appears fairly ill frail needing PT eval blood pressure soft so discharge was held and transferred to [facility] service. Patient has been lethargic sleepy sometimes confused some- BP has been soft, and labs showed AKI AKI was given iv boluses with improvement in her symptoms.  Subjective: Anxious and forgetful at times.  Alert awake, not in acute distress.  No bleeding recurrence agreeable for restarting anticoagulation being switched to Eliquis per her cardiology.  Overnight A. fib with some RVR received 10 of Cardizem IV and also anxious needing IV Ativan.  Assessment & Plan: Right-sided severe epistaxis in the setting of anticoagulation.  Epistal removed  By ENT 4/8. ENT advised to hold patient's Xarelto for couple of days-I have discussed with her cardiologist Dr. Acie Fredrickson- recommend switching to Eliquis for less bleeding risk -start today monitor overnight. No nose blowing for 2 weeks and as needed saline spray.  Hemoglobin overall stable although  Had some acute blood loss anemia from 14.3 to 11.8 g > 12gm stable Recent Labs  Lab 05/18/20 0828 05/19/20 1808 05/20/20 0322 05/21/20 0213 05/22/20 0411  HGB 12.9 11.7* 11.8* 12.0 12.3  HCT 39.1 35.4* 35.8* 36.0 37.0   Chronic A.  fib with RVR: Rate currently controlled, patient has been hypotensive/soft BP-that improved with IV fluid bolus and after stopping losartan.  Cont on her Cardizem, metoprolol.There is question whether patient was taking her medication appropriately given that she presented with hypertension and uncontrolled RVR-since starting her meds blood pressure has been soft now.  Xarelto on hold 2/2 severe epistaxis-I have discussed with her cardiologist Dr. Acie Fredrickson- recommend switching to Eliquis for less bleeding risk -starting today and will monitor overnight.  Monitor on telemetry  Hypotension/soft blood pressure:s/p IV fluids and off losartan.  Stable, continue Cardizem metoprolol for A. Fib.  Lethargy/possible acute metabolic encephalopathy in the setting of soft blood pressure hypotension dehydration: This has resolved with improvement in blood pressure and with IV hydration.ABG showed no CO2 retention.  Appears anxious and forgetful.  Provide supportive care  VOJ:JKKXFGH creatinine was elevated to 1.3 from 0.8-subsequently improved with IV fluids.  Monitor Recent Labs  Lab 05/17/20 0155 05/19/20 1808 05/20/20 0322 05/21/20 0213 05/22/20 0411  BUN 14 28* 26* 17 15  CREATININE 0.82 1.39* 1.00 0.81 0.91   Acute pulmonary insufficiency needing supplemental oxygen wean oxygen as tolerated chest x-ray done 4/9 morning no acute significant finding, patient hypoxia improved.  She is doing well on room air.  Anxiety/depression stable on Zoloft.  Physical deconditioning: Initially SNF was recommended by PT. Now doing well, suggested home health.  Patient refused home health but after much discussion she is agreeable for home health PT.  She does not want any DME.  Morbid obesity with BMI 33 will benefit weight loss.  History of OSA intolerant to CPAP.  ABG  shows no CO2 retention.  Diet Order            Diet Heart Room service appropriate? No; Fluid consistency: Thin  Diet effective now                 Patient's Body mass index is 33.66 kg/m.  DVT prophylaxis: SCDs Start: 05/16/20 1059 Code Status:   Code Status: Full Code  Family Communication: plan of care discussed with patient at bedside. Unable to reach the husband this morning. He Is very hard of hearing.  Status is: Inpatient Remains inpatient appropriate because:Unsafe d/c plan and Inpatient level of care appropriate due to severity of illness Dispo: The patient is from: Home              Anticipated d/c is to: Home tomorrow if no epistaxis.              Patient currently is medically stable for discharge.   Difficult to place patient No  Unresulted Labs (From admission, onward)         None      Medications reviewed:  Scheduled Meds: . Chlorhexidine Gluconate Cloth  6 each Topical Daily  . diltiazem  360 mg Oral Daily  . diltiazem  10 mg Intravenous Once  . metoprolol succinate  100 mg Oral Daily  . sertraline  200 mg Oral QHS  . sodium chloride  2 spray Each Nare Q2H while awake   Continuous Infusions:   Consultants:see note  Procedures:see note  Antimicrobials: Anti-infectives (From admission, onward)   Start     Dose/Rate Route Frequency Ordered Stop   05/17/20 1200  cephALEXin (KEFLEX) capsule 500 mg        500 mg Oral Every 8 hours 05/17/20 0958 05/19/20 2040   05/16/20 1200  cefTRIAXone (ROCEPHIN) 1 g in sodium chloride 0.9 % 100 mL IVPB  Status:  Discontinued        1 g 200 mL/hr over 30 Minutes Intravenous Every 24 hours 05/16/20 1157 05/17/20 0958     Culture/Microbiology    Component Value Date/Time   SDES URINE, RANDOM 02/20/2017 2058   SPECREQUEST NONE 02/20/2017 2058   CULT MULTIPLE SPECIES PRESENT, SUGGEST RECOLLECTION (A) 02/20/2017 2058   REPTSTATUS 02/22/2017 FINAL 02/20/2017 2058    Other culture-see note  Objective: Vitals: Today's Vitals   05/22/20 0238 05/22/20 0547 05/22/20 0753 05/22/20 0921  BP:  107/67 (!) 150/76 129/71  Pulse:  98 69 71  Resp:  20 18 18   Temp:   98.6 F (37 C) (!) 97.4 F (36.3 C) 98.6 F (37 C)  TempSrc:  Oral Axillary Oral  SpO2:  98% 98% 98%  Weight:      Height:      PainSc: Asleep   0-No pain    Intake/Output Summary (Last 24 hours) at 05/22/2020 1030 Last data filed at 05/22/2020 0951 Gross per 24 hour  Intake 480 ml  Output 550 ml  Net -70 ml   Filed Weights   05/20/20 0500 05/21/20 0015 05/22/20 0131  Weight: 85.3 kg 86 kg 86.2 kg   Weight change: 0.2 kg  Intake/Output from previous day: 04/10 0701 - 04/11 0700 In: 720 [P.O.:720] Out: 550 [Urine:550] Intake/Output this shift: No intake/output data recorded. Filed Weights   05/20/20 0500 05/21/20 0015 05/22/20 0131  Weight: 85.3 kg 86 kg 86.2 kg    Examination: General exam: AAOx3, NAD, weak appearing. HEENT:Oral mucosa moist, Ear/Nose WNL grossly, dentition normal. Respiratory system: bilaterally clear,no wheezing  or crackles,no use of accessory muscle Cardiovascular system: S1 & S2 +, regular, no JVD. Gastrointestinal system: Abdomen soft, NT,ND, BS+ Nervous System:Alert, awake, moving extremities and grossly nonfocal Extremities: No edema, distal peripheral pulses palpable.  Skin: No rashes,no icterus. MSK: Normal muscle bulk,tone, power  Data Reviewed: I have personally reviewed following labs and imaging studies CBC: Recent Labs  Lab 05/16/20 0810 05/17/20 0155 05/18/20 0828 05/19/20 1808 05/20/20 0322 05/21/20 0213 05/22/20 0411  WBC 6.9 11.7*  --  7.4 6.5 5.4 4.5  NEUTROABS 4.6  --   --   --   --   --   --   HGB 14.3 14.1 12.9 11.7* 11.8* 12.0 12.3  HCT 43.5 42.8 39.1 35.4* 35.8* 36.0 37.0  MCV 92.2 92.0  --  93.4 92.0 91.1 92.3  PLT 216 277  --  219 234 236 782   Basic Metabolic Panel: Recent Labs  Lab 05/16/20 0810 05/17/20 0155 05/19/20 1808 05/20/20 0322 05/21/20 0213 05/22/20 0411  NA 141 141 135 137 138 139  K 3.4* 3.6 3.5 3.5 3.5 3.6  CL 110 109 101 104 107 109  CO2 25 23 30 25 28 27   GLUCOSE 116* 136* 123* 106*  98 115*  BUN 19 14 28* 26* 17 15  CREATININE 0.76 0.82 1.39* 1.00 0.81 0.91  CALCIUM 9.4 9.9 9.4 9.2 8.9 9.2  MG 2.0  --   --   --   --   --    GFR: Estimated Creatinine Clearance: 55.6 mL/min (by C-G formula based on SCr of 0.91 mg/dL). Liver Function Tests: Recent Labs  Lab 05/19/20 1808  AST 12*  ALT 16  ALKPHOS 45  BILITOT 0.8  PROT 4.9*  ALBUMIN 2.6*   No results for input(s): LIPASE, AMYLASE in the last 168 hours. No results for input(s): AMMONIA in the last 168 hours. Coagulation Profile: Recent Labs  Lab 05/16/20 0810  INR 2.6*   Cardiac Enzymes: No results for input(s): CKTOTAL, CKMB, CKMBINDEX, TROPONINI in the last 168 hours. BNP (last 3 results) No results for input(s): PROBNP in the last 8760 hours. HbA1C: No results for input(s): HGBA1C in the last 72 hours. CBG: Recent Labs  Lab 05/18/20 0725 05/21/20 0623  GLUCAP 162* 103*   Lipid Profile: No results for input(s): CHOL, HDL, LDLCALC, TRIG, CHOLHDL, LDLDIRECT in the last 72 hours. Thyroid Function Tests: No results for input(s): TSH, T4TOTAL, FREET4, T3FREE, THYROIDAB in the last 72 hours. Anemia Panel: No results for input(s): VITAMINB12, FOLATE, FERRITIN, TIBC, IRON, RETICCTPCT in the last 72 hours. Sepsis Labs: No results for input(s): PROCALCITON, LATICACIDVEN in the last 168 hours.  Recent Results (from the past 240 hour(s))  MRSA PCR Screening     Status: None   Collection Time: 05/16/20  2:59 PM   Specimen: Nasopharyngeal  Result Value Ref Range Status   MRSA by PCR NEGATIVE NEGATIVE Final    Comment:        The GeneXpert MRSA Assay (FDA approved for NASAL specimens only), is one component of a comprehensive MRSA colonization surveillance program. It is not intended to diagnose MRSA infection nor to guide or monitor treatment for MRSA infections. Performed at [facility], [place] [address], [place],  [address]   Resp Panel by RT-PCR (Flu A&B, Covid) Nasopharyngeal  Swab     Status: None   Collection Time: 05/16/20  3:56 PM   Specimen: Nasopharyngeal Swab; Nasopharyngeal(NP) swabs in vial transport medium  Result Value Ref Range Status  SARS Coronavirus 2 by RT PCR NEGATIVE NEGATIVE Final    Comment: (NOTE) SARS-CoV-2 target nucleic acids are NOT DETECTED.  The SARS-CoV-2 RNA is generally detectable in upper respiratory specimens during the acute phase of infection. The lowest concentration of SARS-CoV-2 viral copies this assay can detect is 138 copies/mL. A negative result does not preclude SARS-Cov-2 infection and should not be used as the sole basis for treatment or other patient management decisions. A negative result may occur with  improper specimen collection/handling, submission of specimen other than nasopharyngeal swab, presence of viral mutation(s) within the areas targeted by this assay, and inadequate number of viral copies(<138 copies/mL). A negative result must be combined with clinical observations, patient history, and epidemiological information. The expected result is Negative.  Fact Sheet for Patients:  EntrepreneurPulse.com.au  Fact Sheet for Healthcare Providers:  IncredibleEmployment.be  This test is no t yet approved or cleared by the [place] FDA and  has been authorized for detection and/or diagnosis of SARS-CoV-2 by FDA under an Emergency Use Authorization (EUA). This EUA will remain  in effect (meaning this test can be used) for the duration of the COVID-19 declaration under Section 564(b)(1) of the Act, 21 U.S.C.section 360bbb-3(b)(1), unless the authorization is terminated  or revoked sooner.       Influenza A by PCR NEGATIVE NEGATIVE Final   Influenza B by PCR NEGATIVE NEGATIVE Final    Comment: (NOTE) The Xpert Xpress SARS-CoV-2/FLU/RSV plus assay is intended as an aid in the diagnosis of influenza from Nasopharyngeal swab specimens and should not be used as a sole  basis for treatment. Nasal washings and aspirates are unacceptable for Xpert Xpress SARS-CoV-2/FLU/RSV testing.  Fact Sheet for Patients: EntrepreneurPulse.com.au  Fact Sheet for Healthcare Providers: IncredibleEmployment.be  This test is not yet approved or cleared by the [place] FDA and has been authorized for detection and/or diagnosis of SARS-CoV-2 by FDA under an Emergency Use Authorization (EUA). This EUA will remain in effect (meaning this test can be used) for the duration of the COVID-19 declaration under Section 564(b)(1) of the Act, 21 U.S.C. section 360bbb-3(b)(1), unless the authorization is terminated or revoked.  Performed at [facility], [place] [address] [place], [place] [address]      Radiology Studies: No results found.   LOS: 6 days   Antonieta Pert, MD [facility]  05/22/2020, 10:30 AM

## 2020-05-22 NOTE — Care Management Important Message (Signed)
Important Message  Patient Details  Name: Beth Haynes MRN: [id number] Date of Birth: [birth year]   Medicare Important Message Given:  Yes     Shelda Altes 05/22/2020, 9:02 AM

## 2020-05-22 NOTE — Progress Notes (Signed)
Patient HR ranging 110-130 non sustained, afib. Patient appears restless and states she has a sore throat.  MD notified.

## 2020-05-22 NOTE — Discharge Summary (Signed)
Physician Discharge Summary  Beth Haynes TKW:[id number] DOB: 1945/12/22 DOA: 05/16/2020  PCP: Velna Hatchet, MD  Admit date: 05/16/2020 Discharge date: 05/23/2020  Admitted From: home Disposition:  hh  Recommendations for Outpatient Follow-up:  1. Follow up with PCP in 1-2 weeks 2. Please obtain BMP/CBC in one week 3. Please follow up on the following pending results:  Home Health:yes  Equipment/Devices: none  Discharge Condition: Stable Code Status:   Code Status: Full Code Diet recommendation:  Diet Order            Diet - low sodium heart healthy           Diet Heart Room service appropriate? No; Fluid consistency: Thin  Diet effective now                  Brief/Interim Summary: depression/anxiety, chronic diastolic CHF, hyperlipidemia presented to [facility] P4/5 with right-sided epistaxis.  Multiple attempts to control bleeding failed including cautery and packing and transferred to [facility] seen by ENT Epistat was placed and patient also had A. fib with RVR and hypotensive needing Cardizem drip and ICU admission. Her hemoglobin remained stable overall although some drop.  Monitor in ICU, on 4/8 nasal packing was removed.  Patient appears fairly ill frail needing PT eval blood pressure soft so discharge was held and transferred to [facility] service. Patient has been lethargic sleepy sometimes confused some- BP has been soft, and labs showed AKI AKI was given iv boluses with improvement in her symptoms Monitored after starting eliquis on 4/11 and did well no nose bleeding and seen thsi am and is stable for discharge home, anxious to go home soon  Discharge Diagnoses:  Right-sided severe epistaxis in the setting of anticoagulation.  Epistal removed  By ENT 4/8. ENT advised to hold patient's Xarelto for couple of days-I have discussed with her cardiologist Dr. Acie Fredrickson- recommended to switch to Eliquis for less bleeding risk -started 4/11-monitored overnight to make sure patient has no  recurrence of nasal epistaxis.  Hemoglobin overall stable although mild acute blood loss anemia currently at 2.3 g.    Chronic A. fib with RVR: Rate currently controlled, patient has been hypotensive/soft BP-that improved with IV fluid bolus and after stopping losartan.  Cont on her Cardizem, metoprolol ( increased to 100 mg) .There is question whether patient was taking her medication appropriately given that she presented with hypertension and uncontrolled RVR-since starting her meds blood pressure has been soft now.  Xarelto on hold 2/2 severe epistaxis-I have discussed with her cardiologist Dr. Acie Fredrickson- recommended switching to Eliquis.  Hypotension/soft blood pressure:s/p IV fluids and off losartan.  Stable, continue Cardizem, metoprolol for A. Fib and dose was increased to 100 mg. .  Lethargy/possible acute metabolic encephalopathy in the setting of soft blood pressure hypotension dehydration: This has resolved with improvement in blood pressure and with IV hydration.ABG showed no CO2 retention.  Appears anxious and forgetful.  Provide supportive care  JME:QASTMHD creatinine was elevated to 1.3 from 0.8-subsequently improved with IV fluids.  Monitor Last Labs          Recent Labs  Lab 05/17/20 0155 05/19/20 1808 05/20/20 0322 05/21/20 0213 05/22/20 0411  BUN 14 28* 26* 17 15  CREATININE 0.82 1.39* 1.00 0.81 0.91     Acute pulmonary insufficiency needing supplemental oxygen wean oxygen as tolerated chest x-ray done 4/9 morning no acute significant finding, patient hypoxia improved.  She is doing well on room air.  Anxiety/depression stable on Zoloft.  Physical deconditioning:  Initially SNF was recommended by PT. Now doing well, suggested home health.  Patient refused home health but after much discussion she is agreeable for home health PT.  She does not want any DME.  Morbid obesity with BMI 33 will benefit weight loss.  History of OSA intolerant to CPAP.  ABG shows no CO2  retention.   Consults:  ENT,PCCM, her cardiology over secure chat  Subjective: Aaox3, no new complaints, wants to go home, no epistaxis  Discharge Exam: Vitals:   05/23/20 0427 05/23/20 0821  BP: 134/90 124/77  Pulse: 85 61  Resp: 17 20  Temp: 98.1 F (36.7 C) 98.1 F (36.7 C)  SpO2: 96% 99%   General: Pt is alert, awake, not in acute distress Cardiovascular: RRR, S1/S2 +, no rubs, no gallops Respiratory: CTA bilaterally, no wheezing, no rhonchi Abdominal: Soft, NT, ND, bowel sounds + Extremities: no edema, no cyanosis  Discharge Instructions  Discharge Instructions    Diet - low sodium heart healthy   Complete by: As directed    Discharge instructions   Complete by: As directed    No nose blowing for 2 weeks.  Follow-up CBC BMP in 1 week with your primary care doctor.  Follow-up with ENT cardiology. Please call call MD or return to ER for similar or worsening recurring problem that brought you to hospital or if any fever,nausea/vomiting,abdominal pain, uncontrolled pain, chest pain,  shortness of breath or any other alarming symptoms.  Please follow-up your doctor as instructed in a week time and call the office for appointment.  Please avoid alcohol, smoking, or any other illicit substance and maintain healthy habits including taking your regular medications as prescribed.  You were cared for by a hospitalist during your hospital stay. If you have any questions about your discharge medications or the care you received while you were in the hospital after you are discharged, you can call the unit and ask to speak with the hospitalist on call if the hospitalist that took care of you is not available.  Once you are discharged, your primary care physician will handle any further medical issues. Please note that NO REFILLS for any discharge medications will be authorized once you are discharged, as it is imperative that you return to your primary care physician (or establish a  relationship with a primary care physician if you do not have one) for your aftercare needs so that they can reassess your need for medications and monitor your lab values   Increase activity slowly   Complete by: As directed      Allergies as of 05/23/2020      Reactions   Sulfa Antibiotics Other (See Comments)   Childhood allergy    Codeine    Other reaction(s): Unknown   Epinephrine Other (See Comments)   Panic attacks      Medication List    STOP taking these medications   losartan 50 MG tablet Commonly known as: COZAAR   rivaroxaban 20 MG Tabs tablet Commonly known as: Xarelto     TAKE these medications   apixaban 5 MG Tabs tablet Commonly known as: ELIQUIS Take 1 tablet (5 mg total) by mouth 2 (two) times daily.   budesonide 3 MG 24 hr capsule Commonly known as: ENTOCORT EC Take 9 mg by mouth every morning.   Cholecalciferol 1.25 MG (50000 UT) Tabs Take 50,000 Units by mouth every Tuesday.   ciclopirox 8 % solution Commonly known as: Penlac Apply topically at bedtime. Apply over nail  and surrounding skin. Apply daily over previous coat. After seven (7) days, may remove with alcohol and continue cycle.   diltiazem 360 MG 24 hr capsule Commonly known as: CARDIZEM CD TAKE 1 CAPSULE BY MOUTH DAILY. What changed:   how much to take  how to take this  when to take this  additional instructions   metoprolol succinate 100 MG 24 hr tablet Commonly known as: TOPROL-XL Take 1 tablet (100 mg total) by mouth daily. Take with or immediately following a meal. What changed:   medication strength  how much to take   mirabegron ER 25 MG Tb24 tablet Commonly known as: MYRBETRIQ Take 25 mg by mouth daily.   sertraline 100 MG tablet Commonly known as: ZOLOFT Take 200 mg by mouth at bedtime.       Follow-up Information    Fredonia Highland, NP Follow up on 05/23/2020.   Specialty: Internal Medicine Why: Appt at 2 PM. Please arrive at 1:45 for check  in.  This is for follow up of BP control, to ensure no bleeding on eliquis after restart.  Contact information: [place] [address] [phone]        Velna Hatchet, MD.   Specialty: Internal Medicine Contact information: [place] [address] [place], [facility] Follow up.   Specialty: Home Health Services Why: HHRN,HHPT Contact information: [place] [address] [phone]        Nahser, Wonda Cheng, MD. Call in 1 day(s).   Specialty: Cardiology Contact information: [place] [address] [phone]        Melida Quitter, MD. Schedule an appointment as soon as possible for a visit.   Specialty: Otolaryngology Why: for any nose bleeding issues Contact information: [address] [place] [address] [phone]              Allergies  Allergen Reactions  . Sulfa Antibiotics Other (See Comments)    Childhood allergy   . Codeine     Other reaction(s): Unknown  . Epinephrine Other (See Comments)    Panic attacks     The results of significant diagnostics from this hospitalization (including imaging, microbiology, ancillary and laboratory) are listed below for reference.    Microbiology: Recent Results (from the past 240 hour(s))  MRSA PCR Screening     Status: None   Collection Time: 05/16/20  2:59 PM   Specimen: Nasopharyngeal  Result Value Ref Range Status   MRSA by PCR NEGATIVE NEGATIVE Final    Comment:        The GeneXpert MRSA Assay (FDA approved for NASAL specimens only), is one component of a comprehensive MRSA colonization surveillance program. It is not intended to diagnose MRSA infection nor to guide or monitor treatment for MRSA infections. Performed at [facility], [place] [address], [place], [place] [address]   Resp Panel by RT-PCR (Flu A&B, Covid) Nasopharyngeal Swab     Status: None    Collection Time: 05/16/20  3:56 PM   Specimen: Nasopharyngeal Swab; Nasopharyngeal(NP) swabs in vial transport medium  Result Value Ref Range Status   SARS Coronavirus 2 by RT PCR NEGATIVE NEGATIVE Final    Comment: (NOTE) SARS-CoV-2 target nucleic acids are NOT DETECTED.  The SARS-CoV-2 RNA is generally detectable in upper respiratory specimens during the acute phase of infection. The lowest concentration of SARS-CoV-2 viral copies this  assay can detect is 138 copies/mL. A negative result does not preclude SARS-Cov-2 infection and should not be used as the sole basis for treatment or other patient management decisions. A negative result may occur with  improper specimen collection/handling, submission of specimen other than nasopharyngeal swab, presence of viral mutation(s) within the areas targeted by this assay, and inadequate number of viral copies(<138 copies/mL). A negative result must be combined with clinical observations, patient history, and epidemiological information. The expected result is Negative.  Fact Sheet for Patients:  EntrepreneurPulse.com.au  Fact Sheet for Healthcare Providers:  IncredibleEmployment.be  This test is no t yet approved or cleared by the [place] FDA and  has been authorized for detection and/or diagnosis of SARS-CoV-2 by FDA under an Emergency Use Authorization (EUA). This EUA will remain  in effect (meaning this test can be used) for the duration of the COVID-19 declaration under Section 564(b)(1) of the Act, 21 U.S.C.section 360bbb-3(b)(1), unless the authorization is terminated  or revoked sooner.       Influenza A by PCR NEGATIVE NEGATIVE Final   Influenza B by PCR NEGATIVE NEGATIVE Final    Comment: (NOTE) The Xpert Xpress SARS-CoV-2/FLU/RSV plus assay is intended as an aid in the diagnosis of influenza from Nasopharyngeal swab specimens and should not be used as a sole basis for treatment.  Nasal washings and aspirates are unacceptable for Xpert Xpress SARS-CoV-2/FLU/RSV testing.  Fact Sheet for Patients: EntrepreneurPulse.com.au  Fact Sheet for Healthcare Providers: IncredibleEmployment.be  This test is not yet approved or cleared by the [place] FDA and has been authorized for detection and/or diagnosis of SARS-CoV-2 by FDA under an Emergency Use Authorization (EUA). This EUA will remain in effect (meaning this test can be used) for the duration of the COVID-19 declaration under Section 564(b)(1) of the Act, 21 U.S.C. section 360bbb-3(b)(1), unless the authorization is terminated or revoked.  Performed at [facility], [place] [address] [place], [place] [address]     Procedures/Studies: DG Chest Port 1 View  Result Date: 05/20/2020 CLINICAL DATA:  75 year old female with history of lethargy, confusion and shortness of breath. EXAM: PORTABLE CHEST 1 VIEW COMPARISON:  Chest x-ray 10/24/2017. FINDINGS: Lung volumes are low. No consolidative airspace disease. Linear scarring in the left mid to lower lung, similar to the prior study. No pleural effusions. No pneumothorax. No pulmonary nodule or mass noted. Mild cardiomegaly. Upper mediastinal contours are within normal limits allowing for patient positioning. Atherosclerotic calcifications in the thoracic aorta. IMPRESSION: 1. Low lung volumes without radiographic evidence of acute cardiopulmonary disease. 2. Aortic atherosclerosis. 3. Mild cardiomegaly. Electronically Signed   By: Vinnie Langton M.D.   On: 05/20/2020 09:30    Labs: BNP (last 3 results) No results for input(s): BNP in the last 8760 hours. Basic Metabolic Panel: Recent Labs  Lab 05/17/20 0155 05/19/20 1808 05/20/20 0322 05/21/20 0213 05/22/20 0411  NA 141 135 137 138 139  K 3.6 3.5 3.5 3.5 3.6  CL 109 101 104 107 109  CO2 23 30 25 28 27   GLUCOSE 136* 123* 106* 98 115*  BUN 14 28* 26* 17 15  CREATININE  0.82 1.39* 1.00 0.81 0.91  CALCIUM 9.9 9.4 9.2 8.9 9.2   Liver Function Tests: Recent Labs  Lab 05/19/20 1808  AST 12*  ALT 16  ALKPHOS 45  BILITOT 0.8  PROT 4.9*  ALBUMIN 2.6*   No results for input(s): LIPASE, AMYLASE in the last 168 hours. No results for input(s): AMMONIA in the  last 168 hours. CBC: Recent Labs  Lab 05/17/20 0155 05/18/20 0828 05/19/20 1808 05/20/20 0322 05/21/20 0213 05/22/20 0411  WBC 11.7*  --  7.4 6.5 5.4 4.5  HGB 14.1 12.9 11.7* 11.8* 12.0 12.3  HCT 42.8 39.1 35.4* 35.8* 36.0 37.0  MCV 92.0  --  93.4 92.0 91.1 92.3  PLT 277  --  219 234 236 215   Cardiac Enzymes: No results for input(s): CKTOTAL, CKMB, CKMBINDEX, TROPONINI in the last 168 hours. BNP: Invalid input(s): POCBNP CBG: Recent Labs  Lab 05/18/20 0725 05/21/20 0623  GLUCAP 162* 103*   D-Dimer No results for input(s): DDIMER in the last 72 hours. Hgb A1c No results for input(s): HGBA1C in the last 72 hours. Lipid Profile No results for input(s): CHOL, HDL, LDLCALC, TRIG, CHOLHDL, LDLDIRECT in the last 72 hours. Thyroid function studies No results for input(s): TSH, T4TOTAL, T3FREE, THYROIDAB in the last 72 hours.  Invalid input(s): FREET3 Anemia work up No results for input(s): VITAMINB12, FOLATE, FERRITIN, TIBC, IRON, RETICCTPCT in the last 72 hours. Urinalysis    Component Value Date/Time   COLORURINE AMBER (A) 02/20/2017 2058   APPEARANCEUR HAZY (A) 02/20/2017 2058   LABSPEC 1.043 (H) 02/20/2017 2058   PHURINE 5.0 02/20/2017 2058   GLUCOSEU NEGATIVE 02/20/2017 2058   HGBUR NEGATIVE 02/20/2017 2058   BILIRUBINUR NEGATIVE 02/20/2017 2058   KETONESUR NEGATIVE 02/20/2017 2058   PROTEINUR 100 (A) 02/20/2017 2058   UROBILINOGEN 0.2 10/10/2014 1812   NITRITE NEGATIVE 02/20/2017 2058   LEUKOCYTESUR NEGATIVE 02/20/2017 2058   Sepsis Labs Invalid input(s): PROCALCITONIN,  WBC,  LACTICIDVEN Microbiology Recent Results (from the past 240 hour(s))  MRSA PCR Screening      Status: None   Collection Time: 05/16/20  2:59 PM   Specimen: Nasopharyngeal  Result Value Ref Range Status   MRSA by PCR NEGATIVE NEGATIVE Final    Comment:        The GeneXpert MRSA Assay (FDA approved for NASAL specimens only), is one component of a comprehensive MRSA colonization surveillance program. It is not intended to diagnose MRSA infection nor to guide or monitor treatment for MRSA infections. Performed at [facility], [place] [employer] [address], [place], [place] [address]   Resp Panel by RT-PCR (Flu A&B, Covid) Nasopharyngeal Swab     Status: None   Collection Time: 05/16/20  3:56 PM   Specimen: Nasopharyngeal Swab; Nasopharyngeal(NP) swabs in vial transport medium  Result Value Ref Range Status   SARS Coronavirus 2 by RT PCR NEGATIVE NEGATIVE Final    Comment: (NOTE) SARS-CoV-2 target nucleic acids are NOT DETECTED.  The SARS-CoV-2 RNA is generally detectable in upper respiratory specimens during the acute phase of infection. The lowest concentration of SARS-CoV-2 viral copies this assay can detect is 138 copies/mL. A negative result does not preclude SARS-Cov-2 infection and should not be used as the sole basis for treatment or other patient management decisions. A negative result may occur with  improper specimen collection/handling, submission of specimen other than nasopharyngeal swab, presence of viral mutation(s) within the areas targeted by this assay, and inadequate number of viral copies(<138 copies/mL). A negative result must be combined with clinical observations, patient history, and epidemiological information. The expected result is Negative.  Fact Sheet for Patients:  EntrepreneurPulse.com.au  Fact Sheet for Healthcare Providers:  IncredibleEmployment.be  This test is no t yet approved or cleared by the [place] FDA and  has been authorized for detection and/or diagnosis of SARS-CoV-2 by FDA under an  Emergency Use Authorization (EUA). This EUA will remain  in effect (meaning this test can be used) for the duration of the COVID-19 declaration under Section 564(b)(1) of the Act, 21 U.S.C.section 360bbb-3(b)(1), unless the authorization is terminated  or revoked sooner.       Influenza A by PCR NEGATIVE NEGATIVE Final   Influenza B by PCR NEGATIVE NEGATIVE Final    Comment: (NOTE) The Xpert Xpress SARS-CoV-2/FLU/RSV plus assay is intended as an aid in the diagnosis of influenza from Nasopharyngeal swab specimens and should not be used as a sole basis for treatment. Nasal washings and aspirates are unacceptable for Xpert Xpress SARS-CoV-2/FLU/RSV testing.  Fact Sheet for Patients: EntrepreneurPulse.com.au  Fact Sheet for Healthcare Providers: IncredibleEmployment.be  This test is not yet approved or cleared by the [place] FDA and has been authorized for detection and/or diagnosis of SARS-CoV-2 by FDA under an Emergency Use Authorization (EUA). This EUA will remain in effect (meaning this test can be used) for the duration of the COVID-19 declaration under Section 564(b)(1) of the Act, 21 U.S.C. section 360bbb-3(b)(1), unless the authorization is terminated or revoked.  Performed at [facility], [place] [address], [place], [place] [address]      Time coordinating discharge: 25 minutes  SIGNED: Antonieta Pert, MD  [facility] 05/23/2020, 8:39 AM  If 7PM-7AM, please contact night-coverage [email or web address]

## 2020-05-22 NOTE — Progress Notes (Addendum)
ANTICOAGULATION CONSULT NOTE - Initial Consult  Pharmacy Consult for apixaban Indication: atrial fibrillation  Allergies  Allergen Reactions  . Sulfa Antibiotics Other (See Comments)    Childhood allergy   . Codeine     Other reaction(s): Unknown  . Epinephrine Other (See Comments)    Panic attacks     Patient Measurements: Height: 5\' 3"  (160 cm) Weight: 86.2 kg (190 lb 0.6 oz) IBW/kg (Calculated) : 52.4  Vital Signs: Temp: 98.6 F (37 C) (04/11 0921) Temp Source: Oral (04/11 0921) BP: 129/71 (04/11 0921) Pulse Rate: 71 (04/11 0921)  Labs: Recent Labs    05/20/20 0322 05/21/20 0213 05/22/20 0411  HGB 11.8* 12.0 12.3  HCT 35.8* 36.0 37.0  PLT 234 236 215  CREATININE 1.00 0.81 0.91    Estimated Creatinine Clearance: 55.6 mL/min (by C-G formula based on SCr of 0.91 mg/dL).   Medical History: Past Medical History:  Diagnosis Date  . Atrial fibrillation ([place])   . Basal cell carcinoma 07/23/2018   right cheekbone-[id number]  . IBS (irritable bowel syndrome)   . Obesity (BMI 30-39.9) 07/11/2015  . OSA (obstructive sleep apnea) 01/17/2015   Severe with AHI 35.8/hr  . Panic attacks   . Urinary incontinence, nocturnal enuresis     Medications:  Medications Prior to Admission  Medication Sig Dispense Refill Last Dose  . budesonide (ENTOCORT EC) 3 MG 24 hr capsule Take 9 mg by mouth every morning.   05/15/2020 at Unknown time  . Cholecalciferol 1.25 MG (50000 UT) TABS Take 50,000 Units by mouth every Tuesday.   05/09/2020  . diltiazem (CARDIZEM CD) 360 MG 24 hr capsule TAKE 1 CAPSULE BY MOUTH DAILY. (Patient taking differently: Take 360 mg by mouth daily.) 90 capsule 3 05/15/2020 at Unknown time  . losartan (COZAAR) 50 MG tablet Take 50 mg by mouth daily. Reported on 07/20/2015  5 05/15/2020 at Unknown time  . metoprolol succinate (TOPROL-XL) 50 MG 24 hr tablet Take 1 tablet (50 mg total) by mouth daily. Take with or immediately following a meal. 90 tablet 3 05/15/2020 at 0800  .  mirabegron ER (MYRBETRIQ) 25 MG TB24 tablet Take 25 mg by mouth daily.   05/15/2020 at Unknown time  . rivaroxaban (XARELTO) 20 MG TABS tablet TAKE ONE TABLET BY MOUTH DAILY WITH SUPPER (Patient taking differently: Take 20 mg by mouth daily with supper.) 30 tablet 5 05/15/2020 at 1900  . sertraline (ZOLOFT) 100 MG tablet Take 200 mg by mouth at bedtime.    05/15/2020 at Unknown time  . ciclopirox (PENLAC) 8 % solution Apply topically at bedtime. Apply over nail and surrounding skin. Apply daily over previous coat. After seven (7) days, may remove with alcohol and continue cycle. (Patient not taking: No sig reported) 6.6 mL 2 Not Taking at Unknown time    Assessment: 49 old female with history of chronic A. fib on Xarelto. She presented 4/5 to [facility] with right-sided epistaxis.  Multiple attempts to control bleeding failed including cautery and packing and transferred to [facility], seen by ENT, Epistat was placed Patient also had A. fib with RVR and was admitted to ICU. Xarelto has been held for 6 days (last dose 4/4). Patient now stable. Pharmacy consulted to start Eliquis. Cardiology recommended switching patient to Eliquis to reduce bleeding risk.  Hgb/Plt currently stable. SCr is closer to baseline now at 0.91 (0.76 on 4/5, up to 1.39 on 4/8).   Goal of Therapy:  Anticoagulation Monitor platelets by anticoagulation protocol: Yes   Plan:  Discontinue PTA Xarelto on discharge Start Eliquis 5 mg PO BID Monitor for signs/symptoms of bleeding   Thank you for allowing [place] to participate in this patients care. Jens Som, PharmD 05/22/2020 11:22 AM  Please check [email or web address] for unit-specific pharmacy phone numbers.

## 2020-05-22 NOTE — Progress Notes (Signed)
OT Cancellation Note  Patient Details Name: Beth Haynes MRN: [id number] DOB: Aug 27, 1945   Cancelled Treatment:    Reason Eval/Treat Not Completed: Fatigue/lethargy limiting ability to participate Attempting x2 to complete treatment, first time eating lunch, second attempt falling asleep repeatedly when engaging in energy conservation education; therapy will continue to follow.   Corinne Ports E. Walthall, [facility] [phone] [place] 05/22/2020, 1:19 PM

## 2020-05-22 NOTE — TOC Progression Note (Signed)
Transition of Care [facility]) - Progression Note    Patient Details  Name: Beth Haynes MRN: [id number] Date of Birth: 1945-12-12  Transition of [facility]) CM/SW Contact  Zenon Mayo, RN Phone Number: 05/22/2020, 9:56 AM  Clinical Narrative:    NCM spoke with patient , she states she has a rolling walker and a shower chair at home.  She does not need any more DME , her spouse called her and told her she still has the DME in the basement.  She states she does not want [facility] services.  TOC will continue to follow for dc needs.   Expected Discharge Plan: Home/Self Care Barriers to Discharge: Continued Medical Work up  Expected Discharge Plan and Services Expected Discharge Plan: Home/Self Care       Living arrangements for the past 2 months: Single Family Home                                       Social Determinants of Health (SDOH) Interventions    Readmission Risk Interventions No flowsheet data found.

## 2020-05-22 NOTE — TOC Transition Note (Signed)
Transition of Care [facility]) - CM/SW Discharge Note   Patient Details  Name: Beth Haynes MRN: [id number] Date of Birth: 20-Feb-1945  Transition of Care [facility]) CM/SW Contact:  Zenon Mayo, RN Phone Number: 05/22/2020, 1:52 PM   Clinical Narrative:    NCM offered choice to patient for HHPT, she states she does not have a preference.  NCM made referral to [facility] with [facility].  He is able to take referral for [place], [place]. Soc will begin 24 to 48hrs post dc.     Final next level of care: [place] Barriers to Discharge: Continued Medical Work up   Patient Goals and CMS Choice Patient states their goals for this hospitalization and ongoing recovery are:: return home with [facility] CMS Medicare.gov Compare Post Acute Care list provided to:: Patient Choice offered to / list presented to : Patient  Discharge Placement                       Discharge Plan and Services                  DME Agency: NA       HH Arranged: RN,PT,Disease Management [employer] Agency: [place] Date [place]: 05/22/20 Time [place]: [id number] Representative spoke with at [place]: [place] ([place]) Interventions     Readmission Risk Interventions No flowsheet data found.

## 2020-05-23 ENCOUNTER — Other Ambulatory Visit (HOSPITAL_COMMUNITY): Payer: Self-pay

## 2020-05-23 DIAGNOSIS — R040 Epistaxis: Secondary | ICD-10-CM | POA: Diagnosis not present

## 2020-05-23 MED ORDER — APIXABAN 5 MG PO TABS
5.0000 mg | ORAL_TABLET | Freq: Two times a day (BID) | ORAL | 0 refills | Status: DC
  Filled 2020-05-23: qty 60, 30d supply, fill #0

## 2020-05-23 MED ORDER — METOPROLOL SUCCINATE ER 100 MG PO TB24
100.0000 mg | ORAL_TABLET | Freq: Every day | ORAL | 0 refills | Status: DC
  Filled 2020-05-23: qty 30, 30d supply, fill #0

## 2020-05-23 NOTE — TOC Benefit Eligibility Note (Signed)
Transition of Care [facility]) Benefit Eligibility Note    Patient Details  Name: Beth Haynes MRN: [id number] Date of Birth: 05/29/1945   Medication/Dose: ELIQUIS  2.5 MG BID CO-PAY- $47.00 and  ELIQUIS  5 MG BID  CO-PAY- $47.00  Covered?: Yes  Tier: 3 Drug  Prescription Coverage Preferred Pharmacy: [facility]  and  [facility]  Spoke with Person/Company/Phone Number:: SAE D. @  [facility] #  [phone]  Co-Pay: $47.00  Prior Approval: No  Deductible:  (NO DEDUCTIBLE WITH PLAN   OUT-OF-POCKET :Linton Ham Phone Number: 05/23/2020, 11:01 AM

## 2020-05-23 NOTE — TOC Transition Note (Signed)
Transition of Care [facility]) - CM/SW Discharge Note   Patient Details  Name: Beth Haynes MRN: [id number] Date of Birth: 03-25-1945  Transition of Care [facility]) CM/SW Contact:  Zenon Mayo, RN Phone Number: 05/23/2020, 9:47 AM   Clinical Narrative:    Patient is for dc today, Tommi Rumps with [facility] notified.  She will be on eliquis , TOC filled the first 30 days free and her copay will be 47.00 for refills per TOC .     Final next level of care: [place] Barriers to Discharge: No Barriers Identified   Patient Goals and CMS Choice Patient states their goals for this hospitalization and ongoing recovery are:: return home CMS Medicare.gov Compare Post Acute Care list provided to:: Patient Choice offered to / list presented to : Patient  Discharge Placement                       Discharge Plan and Services                  DME Agency: NA       HH Arranged: RN,PT,Disease Management [place] Agency: [place] Date [place]: 05/22/20 Time [place]: [id number] Representative spoke with at [place]: [place] ([place]) Interventions     Readmission Risk Interventions No flowsheet data found.

## 2020-05-23 NOTE — Discharge Instructions (Signed)
Nosebleed, Adult A nosebleed is when blood comes out of the nose. Nosebleeds are common and can be caused by many things. They are usually not a sign of a serious medical problem. Follow these instructions at home: When you have a nosebleed:  Sit down.  Tilt your head a little forward.  Follow these steps: 1. Pinch your nose with a clean towel or tissue. 2. Keep pinching your nose for 5 minutes. Do not let go. 3. After 5 minutes, let go of your nose. 4. If there is still bleeding, do these steps again. Keep doing these steps until the bleeding stops.  Do not put tissues or other things in your nose to stop the bleeding.  Avoid lying down or putting your head back.  Use a nose spray decongestant as told by your doctor.   After a nosebleed:  Try not to blow your nose or sniffle for several hours.  Try not to strain, lift, or bend at the waist for several days.  Aspirin and blood-thinning medicines make bleeding more likely. If you take these medicines: ? Ask your doctor if you should stop taking them or if you should change how much you take. ? Do not stop taking the medicine unless your doctor tells you to.  If your nosebleed was caused by dryness, use over-the-counter saline nasal spray or gel and humidifier as told by your doctor. This will keep the inside of your nose moist and allow it to heal. If you need to use one of these products: ? Choose one that is water-soluble. ? Use only as much as you need and use it only as often as needed. ? Do not lie down right away after you use it.  If you get nosebleeds often, talk with your doctor about treatments. These may include: ? Nasal cautery. A chemical swab or electrical device is used to lightly burn tiny blood vessels inside the nose. This helps stop or prevent nosebleeds. ? Nasal packing. A gauze or other material is placed in the nose to keep constant pressure on the bleeding area. Contact a doctor if:  You have a  fever.  You get nosebleeds often.  You are getting nosebleeds more often than usual.  You bruise very easily.  You have something stuck in your nose.  You have bleeding in your mouth.  You vomit or cough up brown material.  You get a nosebleed after you start a new medicine. Get help right away if:  You have a nosebleed after you fall or hurt your head.  Your nosebleed does not go away after 20 minutes.  You feel dizzy or weak.  You have unusual bleeding from other parts of your body.  You have unusual bruising on other parts of your body.  You get sweaty.  You vomit blood. Summary  Nosebleeds are common. They are usually not a sign of a serious medical problem.  When you have a nosebleed, sit down and tilt your head a little forward. Pinch your nose with a clean tissue for 5 minutes.  Use saline spray or saline gel and a humidifier as told by your doctor.  Get help right away if your nosebleed does not go away after 20 minutes. This information is not intended to replace advice given to you by your health care provider. Make sure you discuss any questions you have with your health care provider. Document Revised: 11/26/2018 Document Reviewed: 11/26/2018 Elsevier Patient Education  2021 [employer]. Information on  my medicine - ELIQUIS (apixaban)  This medication education was reviewed with me or my healthcare representative as part of my discharge preparation.   Why was Eliquis prescribed for you? Eliquis was prescribed for you to reduce the risk of a blood clot forming that can cause a stroke if you have a medical condition called atrial fibrillation (a type of irregular heartbeat).  What do You need to know about Eliquis ? Take your Eliquis TWICE DAILY - one tablet in the morning and one tablet in the evening with or without food. If you have difficulty swallowing the tablet whole please discuss with your pharmacist how to take the medication  safely.  Take Eliquis exactly as prescribed by your doctor and DO NOT stop taking Eliquis without talking to the doctor who prescribed the medication.  Stopping may increase your risk of developing a stroke.  Refill your prescription before you run out.  After discharge, you should have regular check-up appointments with your healthcare provider that is prescribing your Eliquis.  In the future your dose may need to be changed if your kidney function or weight changes by a significant amount or as you get older.  What do you do if you miss a dose? If you miss a dose, take it as soon as you remember on the same day and resume taking twice daily.  Do not take more than one dose of ELIQUIS at the same time to make up a missed dose.  Important Safety Information A possible side effect of Eliquis is bleeding. You should call your healthcare provider right away if you experience any of the following: ? Bleeding from an injury or your nose that does not stop. ? Unusual colored urine (red or dark brown) or unusual colored stools (red or black). ? Unusual bruising for unknown reasons. ? A serious fall or if you hit your head (even if there is no bleeding).  Some medicines may interact with Eliquis and might increase your risk of bleeding or clotting while on Eliquis. To help avoid this, consult your healthcare provider or pharmacist prior to using any new prescription or non-prescription medications, including herbals, vitamins, non-steroidal anti-inflammatory drugs (NSAIDs) and supplements.  This website has more information on Eliquis (apixaban): http://www.eliquis.com/eliquis/home

## 2020-05-23 NOTE — Progress Notes (Signed)
D/C instructions given and reviewed. Tele and IV removed, tolerated well. Awaiting husband to transport home.

## 2020-05-23 NOTE — Plan of Care (Signed)

## 2020-05-23 NOTE — Progress Notes (Signed)
Physical Therapy Treatment Patient Details Name: Beth Haynes MRN: [id number] DOB: 07-22-1945 Today's Date: 05/23/2020    History of Present Illness Pt is a 75 y.o. female [facility] ED on 05/16/2020 with R epistaxis, multiple attempts to stop bleeding were unsuccessful, and pt was referred for ENT consult. ENT placed epistat catheter on 4/5 with resolution of bleeding. Epistat removed 4/7, no further nose bleeding noted. Course complicated by lethargy/AMS with hypotension; additional workup for AKI. PMH includes OSA, HTN, depression, CHF, Afib, ICH.   PT Comments    Pt progressing with mobility. Pt remains limited by decreased safety awareness, difficulty problem solving and impaired attention, requiring cues for safety and frequent redirection during sesison. Pt's stability much improved with use of RW; pt able to transfer and ambulate with RW and intermittent min guard for balance. Pt preparing for d/c home today. Recommend supervision from family for safety at home, as well as HHPT services to maximize functional mobility and independence.  HR briefly up to 130 with activity Post-ambulation BP 133/83 SpO2 96% on RA   Follow Up Recommendations  Home health PT;Supervision/Assistance - 24 hour     Equipment Recommendations  None recommended by PT    Recommendations for Other Services       Precautions / Restrictions Precautions Precautions: Fall Restrictions Weight Bearing Restrictions: No    Mobility  Bed Mobility Overal bed mobility: Needs Assistance Bed Mobility: Supine to Sit     Supine to sit: Supervision;HOB elevated     General bed mobility comments: Letting legs get tangled in blankets, increased time and effort, cues to stay on task    Transfers Overall transfer level: Needs assistance Equipment used: None;Rolling walker (2 wheeled) Transfers: Sit to/from Stand Sit to Stand: Min guard         General transfer comment: When asked about using RW, pt  reports, "I don't care" but then reaching for UE support upon standing and stating she cannot stand;  Ambulation/Gait Ambulation/Gait assistance: Min guard Gait Distance (Feet): 120 Feet Assistive device: Rolling walker (2 wheeled) Gait Pattern/deviations: Step-through pattern;Drifts right/left;Decreased stride length Gait velocity: Decreased   General Gait Details: Recommended pt use RW based on instability with static/dynamic standing tasks; slow, unsteady gait with RW and min guard for balance; pt appears overwhelmed in crowded hallway environment; pt frequently running into objects with walker but able to self-correct   Stairs             Wheelchair Mobility    Modified Rankin (Stroke Patients Only)       Balance Overall balance assessment: Needs assistance Sitting-balance support: No upper extremity supported;Feet supported Sitting balance-Leahy Scale: Good Sitting balance - Comments: Able to bend over to reach feet multiple times to don shoes   Standing balance support: No upper extremity supported Standing balance-Leahy Scale: Fair Standing balance comment: Can static stand without UE support, unable to accept challenge; static and dynamic stability improved with RW                            Cognition Arousal/Alertness: Awake/alert Behavior During Therapy: WFL for tasks assessed/performed Overall Cognitive Status: No family/caregiver present to determine baseline cognitive functioning Area of Impairment: Memory;Problem solving;Safety/judgement;Attention;Following commands;Awareness                   Current Attention Level: Sustained Memory: Decreased short-term memory Following Commands: Follows one step commands consistently;Follows one step commands with increased time Safety/Judgement:  Decreased awareness of safety;Decreased awareness of deficits Awareness: Emergent Problem Solving: Slow processing;Requires verbal cues General Comments:  Decreased safety awareness, easily distracted      Exercises      General Comments General comments (skin integrity, edema, etc.): Pt frustrated attempting to don shoes requiring encouragement and assist to problem solve through this. Pt concerned about gathering things before d/c despite cues and education regarding d/c timeline      Pertinent Vitals/Pain Pain Assessment: No/denies pain    Home Living                      Prior Function            PT Goals (current goals can now be found in the care plan section) Progress towards PT goals: Progressing toward goals    Frequency    Min 3X/week      PT Plan Current plan remains appropriate    Co-evaluation              AM-PAC PT "6 Clicks" Mobility   Outcome Measure  Help needed turning from your back to your side while in a flat bed without using bedrails?: None Help needed moving from lying on your back to sitting on the side of a flat bed without using bedrails?: A Little Help needed moving to and from a bed to a chair (including a wheelchair)?: A Little Help needed standing up from a chair using your arms (e.g., wheelchair or bedside chair)?: A Little Help needed to walk in hospital room?: A Little Help needed climbing 3-5 steps with a railing? : A Little 6 Click Score: 19    End of Session Equipment Utilized During Treatment: Gait belt Activity Tolerance: Patient tolerated treatment well;Patient limited by fatigue Patient left: in chair;with call bell/phone within reach;with chair alarm set;Other (comment) (with OT present) Nurse Communication: Mobility status PT Visit Diagnosis: Other abnormalities of gait and mobility (R26.89);Difficulty in walking, not elsewhere classified (R26.2);Muscle weakness (generalized) (M62.81)     Time: 7322-0254 PT Time Calculation (min) (ACUTE ONLY): 24 min  Charges:  $Gait Training: 8-22 mins $Therapeutic Activity: 8-22 mins                     Mabeline Caras, PT, DPT Acute Rehabilitation Services  Pager [phone] Office [place] 05/23/2020, 9:49 AM

## 2020-05-23 NOTE — Progress Notes (Addendum)
Occupational Therapy Treatment Patient Details Name: Beth Haynes MRN: [id number] DOB: 1945/10/25 Today's Date: 05/23/2020    History of present illness Pt is a 75 y.o. female presents to [facility] ED on 05/16/2020 with R epistaxis, multiple attempts to stop bleeding were unsuccessful, and pt was referred for ENT consult. ENT placed epistat catheter on 4/5 with resolution of bleeding. Epistat removed 4/7, no further nose bleeding noted. Course complicated by lethargy/AMS with hypotension; additional workup for AKI. PMH includes OSA, HTN, depression, CHF, Afib, ICH.   OT comments  Pt met in hallway with PT, agreeable to OT session. Pt requires minguard for functional mobility. She requires cues for safety awareness with mobility and with transfers. Pt completed UB dressing upon return to recliner. She required cues for problem solving. Pt educated on energy conservation strategies with provided handout, demonstrates cognitive limitations impacting performance with ADL/IADL and functional mobility. HR briefly 130bpm upon return to recliner following ambulation, returned to 109bpm after 30seconds. SpO2  98% RA; BP 133/83 following mobility. Pt with anticipated d/c this date. Should pt remain in acute care, will continue to follow.    Follow Up Recommendations  No OT follow up;Supervision - Intermittent    Equipment Recommendations  3 in 1 bedside commode    Recommendations for Other Services      Precautions / Restrictions Precautions Precautions: Fall Precaution Comments: monitor BP Restrictions Weight Bearing Restrictions: No       Mobility Bed Mobility Overal bed mobility: Needs Assistance Bed Mobility: Supine to Sit     Supine to sit: Supervision;HOB elevated     General bed mobility comments: not assessed, pt with PT in hallway upon arrival, left pt in recliner at d/c    Transfers Overall transfer level: Needs assistance Equipment used: Rolling walker (2 wheeled) Transfers: Sit  to/from Stand Sit to Stand: Min guard         General transfer comment: minguard for safety, requires increased cues for use of RW    Balance Overall balance assessment: Needs assistance Sitting-balance support: No upper extremity supported;Feet supported Sitting balance-Leahy Scale: Good Sitting balance - Comments: Able to bend over to reach feet multiple times to don shoes   Standing balance support: No upper extremity supported Standing balance-Leahy Scale: Fair Standing balance comment: Can static stand without UE support, unable to accept challenge; static and dynamic stability improved with RW                           ADL either performed or assessed with clinical judgement   ADL Overall ADL's : At baseline                                       General ADL Comments: Pt able to complete ADL with cues for safety and problem solving, per evalutation, pt is at baseline;completed UB dressing with increased time     Vision       Perception     Praxis      Cognition Arousal/Alertness: Awake/alert Behavior During Therapy: WFL for tasks assessed/performed Overall Cognitive Status: No family/caregiver present to determine baseline cognitive functioning Area of Impairment: Memory;Problem solving;Safety/judgement;Attention;Following commands;Awareness                 Orientation Level: Disoriented to;Situation Current Attention Level: Sustained Memory: Decreased short-term memory Following Commands: Follows one step commands consistently;Follows one step  commands with increased time Safety/Judgement: Decreased awareness of safety;Decreased awareness of deficits Awareness: Emergent Problem Solving: Slow processing;Requires verbal cues General Comments: easily distracted, demonstrates difficulty problem solving, memory limitations, difficulty recalling energy conservation strategy 5 min after discussion/education;decreased awareness of  safety and of limitations. Pt asking how to work remote control and how to call for RN using call button        Exercises     Shoulder Instructions       General Comments     Pertinent Vitals/ Pain       Pain Assessment: No/denies pain Faces Pain Scale: No hurt  Home Living                                          Prior Functioning/Environment              Frequency  Min 2X/week        Progress Toward Goals  OT Goals(current goals can now be found in the care plan section)  Progress towards OT goals: Progressing toward goals  Acute Rehab OT Goals Patient Stated Goal: To be able to walk and to go home OT Goal Formulation: With patient/family Time For Goal Achievement: 06/04/20 Potential to Achieve Goals: Good  Plan Discharge plan remains appropriate    Co-evaluation                 AM-PAC OT "6 Clicks" Daily Activity     Outcome Measure   Help from another person eating meals?: None Help from another person taking care of personal grooming?: A Little Help from another person toileting, which includes using toliet, bedpan, or urinal?: A Little Help from another person bathing (including washing, rinsing, drying)?: A Little Help from another person to put on and taking off regular upper body clothing?: None Help from another person to put on and taking off regular lower body clothing?: A Little 6 Click Score: 20    End of Session Equipment Utilized During Treatment: Gait belt;Rolling walker  OT Visit Diagnosis: Unsteadiness on feet (R26.81);Muscle weakness (generalized) (M62.81);Other symptoms and signs involving cognitive function   Activity Tolerance Patient tolerated treatment well   Patient Left in bed;with call bell/phone within reach;with bed alarm set;with family/visitor present   Nurse Communication Mobility status        Time: 1610-9604 OT Time Calculation (min): 15 min  Charges: OT General Charges $OT Visit: 1  Visit OT Treatments $Self Care/Home Management : 8-22 mins  Helene Kelp OTR/L Acute Rehabilitation Services Office: [place] 05/23/2020, 10:54 AM

## 2020-05-25 ENCOUNTER — Ambulatory Visit: Payer: Medicare Other | Admitting: Physician Assistant

## 2020-05-31 ENCOUNTER — Other Ambulatory Visit (HOSPITAL_COMMUNITY): Payer: Self-pay

## 2020-06-15 ENCOUNTER — Encounter: Payer: Self-pay | Admitting: Cardiovascular Disease

## 2020-06-15 NOTE — Progress Notes (Signed)
Cardiology Office Note   Date:  06/16/2020   ID:  Beth Haynes, DOB 1945-10-03, MRN [id number]  PCP:  Velna Hatchet, MD  Cardiologist:   Mertie Moores, MD   Chief Complaint  Patient presents with  . Atrial Fibrillation  . Congestive Heart Failure   Problem List 1.  Persistent atrial fib 2. Obstructive sleep apnea:   Having difficulty in getting a CPAP mask to fit.      Beth Haynes is a 75 y.o. female who presents for evaluation of possible atrial fib. She woke up with dizziness, room was spinning  Was seen by PA. Presented to the ER .  Head MRI was normal.  Symptoms sound like vertigo   She still has some occasional episodes of dizziness.   Has occasional episodes of palpitations ,  She thinks it may be related to panic attacks. No CP or dyspnea. Does not get any regular exercise.   Has a cabin in [place]   Dec. 6, 2016:  Beth Haynes is seen today for follow up of her atrial fib.  BP is elevated today .   Ate some salty foods this weekend .   July 20, 2015:  Beth Haynes is seen back today .  Has PAF and OSA. She does not have a good CPAP mask that fits.   Dec. 13, 2017:  Was not able to find a CPAP that worked .   Could never get used to it  BP is elevated.  Has been eating more salt than usual - she and her husband eat popcorn every night.   February 26, 2017: Was hospitalized with CHF,  And Afib .  last week. Was not taking her meds and developed CHF  Is on Eliquis but is changing to Xarelto 20 mg a day   Mar 05, 2017:  Beth Haynes is doing better.  Still in AF,  HR is better controlled.  Was not taking the eliquis as directed initialy but has   July 23, 2017:  Beth Haynes is seen back today for history of paroxysmal atrial fibrillation.  She was seen by Richardson Dopp in March and was set up for cardioversion.  She was successfully cardioverted at that time but when she showed up for follow-up visit she was already back in atrial fibrillation.  We have  adopted a rate control and anticoagulation strategy at that time.  She continues on  Xarelto.  Getting some [facility] ,  [facility]  [facility] 2019:  We discussed Beth Haynes,  [employer]  They go up every weekend .   Beth Haynes is seen today for follow-up of her atrial fibrillation.  She previously had paroxysmal atrial fibrillation but now it appears that she has persistent atrial fibrillation.  She has had cardioversions in the past but has gone back into atrial fibrillation several times.  At this point we have developed a rate control and anticoagulation strategy.  Has been exercising but has not lost weight. clothers are looser  Wt. Is 197 ( up 17 lbs )  Eats ice cream every night .   Nov. 29, 2021: Beth Haynes is seen for her persistent atrial fib. Wt. Is   190 lbs  ( down 7 lbs from last year)  We discussed a new beer at [facility] .  Is walking daily .  Jun 16, 2020: Beth Haynes is seen for follow up visit She was admitted recently with epistazis.  Was on Xarelto Nose was packed by Dr. Dirk Dress xarelto for several days  Advised restarting eliquis after her nose healed.  Is back in atrial fib   Is working out at the [employer] says she is sleeping constantly  I advised him to brink this up with his primary MD  She is unsteady  On her feet.   I advised a cane or walker    Past Medical History:  Diagnosis Date  . Atrial fibrillation ([place])   . Basal cell carcinoma 07/23/2018   right cheekbone-[id number]  . IBS (irritable bowel syndrome)   . Obesity (BMI 30-39.9) 07/11/2015  . OSA (obstructive sleep apnea) 01/17/2015   Severe with AHI 35.8/hr  . Panic attacks   . Urinary incontinence, nocturnal enuresis     Past Surgical History:  Procedure Laterality Date  . CARDIOVERSION N/A 04/02/2017   Procedure: CARDIOVERSION;  Surgeon: Skeet Latch, MD;  Location: [facility];  Service: Cardiovascular;  Laterality: N/A;  . SKIN BIOPSY N/A 07/23/2018    mid upper back-moderate-0 tmt  . TONSILLECTOMY       Current Outpatient Medications  Medication Sig Dispense Refill  . apixaban (ELIQUIS) 5 MG TABS tablet Take 1 tablet (5 mg total) by mouth 2 (two) times daily. 60 tablet 0  . budesonide (ENTOCORT EC) 3 MG 24 hr capsule Take 9 mg by mouth every morning.    . Cholecalciferol 1.25 MG (50000 UT) TABS Take 50,000 Units by mouth every Tuesday.    . ciclopirox (PENLAC) 8 % solution Apply topically at bedtime. Apply over nail and surrounding skin. Apply daily over previous coat. After seven (7) days, may remove with alcohol and continue cycle. 6.6 mL 2  . diltiazem (CARDIZEM CD) 360 MG 24 hr capsule TAKE 1 CAPSULE BY MOUTH DAILY. (Patient taking differently: Take 360 mg by mouth daily.) 90 capsule 3  . metoprolol succinate (TOPROL-XL) 100 MG 24 hr tablet Take 1 tablet (100 mg total) by mouth daily. Take with or immediately following a meal. 30 tablet 0  . sertraline (ZOLOFT) 100 MG tablet Take 200 mg by mouth at bedtime.      No current facility-administered medications for this visit.    Allergies:   Sulfa antibiotics, Codeine, and Epinephrine    Social History:  The patient  reports that she has never smoked. She has never used smokeless tobacco. She reports that she does not drink alcohol and does not use drugs.   Family History:  The patient's family history includes Breast cancer in her maternal aunt; Heart attack in her father and paternal grandfather; Heart failure in her mother; Hypertension in her maternal grandmother.    ROS: Noted in current history, otherwise negative.   Physical Exam: Blood pressure 128/82, pulse (!) 59, height 5\' 3"  (1.6 m), weight 190 lb (86.2 kg), SpO2 97 %.  GEN:  Well nourished, well developed in no acute distress HEENT: Normal NECK: No JVD; No carotid bruits LYMPHATICS: No lymphadenopathy CARDIAC: irreg. Irreg.  RESPIRATORY:  Clear to auscultation without rales, wheezing or rhonchi  ABDOMEN: Soft,  non-tender, non-distended MUSCULOSKELETAL:  No edema; No deformity  SKIN: Warm and dry NEUROLOGIC:  Alert and oriented x 3    EKG:        Recent Labs: 05/16/2020: Magnesium 2.0 05/19/2020: ALT 16 05/22/2020: BUN 15; Creatinine, Ser 0.91; Hemoglobin 12.3; Platelets 215; Potassium 3.6; Sodium 139    Lipid Panel    Component Value Date/Time   CHOL 127 02/21/2017 0836   TRIG  66 02/21/2017 0836   HDL 37 (L) 02/21/2017 0836   CHOLHDL 3.4 02/21/2017 0836   VLDL 13 02/21/2017 0836   LDLCALC 77 02/21/2017 0836      Wt Readings from Last 3 Encounters:  06/16/20 190 lb (86.2 kg)  05/23/20 191 lb 9.3 oz (86.9 kg)  01/10/20 190 lb 9.6 oz (86.5 kg)      Other studies Reviewed: Additional studies/ records that were reviewed today include: . Review of the above records demonstrates:    ASSESSMENT AND PLAN:  1.  Vertigo:     Working with PT and OT   2.    atrial fib:    She has persistent atrial fibrillation.  Continue Eliquis.  She was on Xarelto but this caused a nosebleed.  She seems to be doing better on Eliquis.   3. Obstructive sleep apnea:         4. Essential HTN:     -    5. Obesity:    Current medicines are reviewed at length with the patient today.  The patient does not have concerns regarding medicines.  The following changes have been made:  no change  Labs/ tests ordered today include:   No orders of the defined types were placed in this encounter.    Disposition:   FU with me in  6 months .      Mertie Moores, MD  06/16/2020 2:04 PM    [facility] [place], [place], [place]  [address] Phone: [phone]; Fax: [phone]

## 2020-06-16 ENCOUNTER — Ambulatory Visit: Payer: Medicare Other | Admitting: Cardiovascular Disease

## 2020-06-16 ENCOUNTER — Other Ambulatory Visit: Payer: Self-pay

## 2020-06-16 ENCOUNTER — Encounter: Payer: Self-pay | Admitting: Cardiovascular Disease

## 2020-06-16 VITALS — BP 128/82 | HR 59 | Ht 63.0 in | Wt 190.0 lb

## 2020-06-16 DIAGNOSIS — I5032 Chronic diastolic (congestive) heart failure: Secondary | ICD-10-CM | POA: Diagnosis not present

## 2020-06-16 NOTE — Patient Instructions (Signed)
Medication Instructions:  Your physician recommends that you continue on your current medications as directed. Please refer to the Current Medication list given to you today.  *If you need a refill on your cardiac medications before your next appointment, please call your pharmacy*   Lab Work: none If you have labs (blood work) drawn today and your tests are completely normal, you will receive your results only by: . MyChart Message (if you have MyChart) OR . A paper copy in the mail If you have any lab test that is abnormal or we need to change your treatment, we will call you to review the results.   Testing/Procedures: none   Follow-Up: At [facility], you and your health needs are our priority.  As part of our continuing mission to provide you with exceptional heart care, we have created designated Provider Care Teams.  These Care Teams include your primary Cardiologist (physician) and Advanced Practice Providers (APPs -  Physician Assistants and Nurse Practitioners) who all work together to provide you with the care you need, when you need it.   Your next appointment:   6 month(s)  The format for your next appointment:   In Person  Provider:   You may see Philip Nahser, MD or one of the following Advanced Practice Providers on your designated Care Team:    Scott Weaver, PA-C  Vin Bhagat, PA-C   

## 2020-07-17 ENCOUNTER — Other Ambulatory Visit (HOSPITAL_COMMUNITY): Payer: Self-pay

## 2020-09-12 ENCOUNTER — Telehealth: Payer: Self-pay | Admitting: Cardiovascular Disease

## 2020-09-12 MED ORDER — APIXABAN 5 MG PO TABS
5.0000 mg | ORAL_TABLET | Freq: Two times a day (BID) | ORAL | 1 refills | Status: DC
Start: 2020-09-12 — End: 2021-09-25

## 2020-09-12 NOTE — Telephone Encounter (Signed)
Eliquis '5mg'$  refill request received. Patient is 75 years old, weight-86.2kg, Crea-0.91 on 05/22/2020, Diagnosis-Afib, and last seen by Dr. Acie Fredrickson on 06/16/2020. Dose is appropriate based on dosing criteria. Will send in refill to requested pharmacy.

## 2020-09-12 NOTE — Telephone Encounter (Signed)
    *  STAT* If patient is at the pharmacy, call can be transferred to refill team.   1. Which medications need to be refilled? (please list name of each medication and dose if known)   apixaban (ELIQUIS) 5 MG TABS tablet     2. Which pharmacy/location (including street and city if local pharmacy) is medication to be sent to? [id number] - [place], [place] - [address]  3. Do they need a 30 day or 90 day supply? 90 days

## 2020-09-18 NOTE — Progress Notes (Signed)
Husband brought by referral for PREP Offered M/W 230-345pm Needs to have a M/W 1pm-215p class, travel out of town on Wednesdays  Needs to be at [place] due to driving distance.  Explained will call his wife the next time we have a M/W class at [facility] at 1pm-215pm open

## 2020-10-02 ENCOUNTER — Other Ambulatory Visit: Payer: Self-pay | Admitting: Registered Nurse

## 2020-10-02 DIAGNOSIS — E2839 Other primary ovarian failure: Secondary | ICD-10-CM

## 2020-10-02 NOTE — Progress Notes (Signed)
[facility] PREP Weekly Session   Patient Details  Name: Beth Haynes MRN: [id number] Date of Birth: 05-Feb-1946 Age: 75 y.o. PCP: Velna Hatchet, MD  There were no vitals filed for this visit.   [name] seesion - 10/02/20 1600       Weekly Session   Topic Discussed Importance of resistance training;Other ways to be active    Classes attended to date 1            Confirmed interest in PREP Will need to do vital signs and fit testing to finalize    Barnett Hatter 10/02/2020, 4:08 PM

## 2020-10-10 NOTE — Progress Notes (Signed)
[facility] PREP Weekly Session   Patient Details  Name: Beth Haynes MRN: [id number] Date of Birth: 01-05-1946 Age: 75 y.o. PCP: Velna Hatchet, MD  There were no vitals filed for this visit.   [name] seesion - 10/10/20 1200       Weekly Session   Topic Discussed Healthy eating tips   Sugar demo   Minutes exercised this week 45 minutes    Classes attended to date 2   missing wednesdays d/t being out of town           Class held at [facility] on 10/09/20   Barnett Hatter 10/10/2020, 12:13 PM

## 2020-10-24 NOTE — Progress Notes (Signed)
[facility] PREP Weekly Session   Patient Details  Name: Beth Haynes MRN: [id number] Date of Birth: 1945-05-22 Age: 75 y.o. PCP: Velna Hatchet, MD  There were no vitals filed for this visit.   [name] seesion - 10/24/20 1100       Weekly Session   Topic Discussed Health habits    Minutes exercised this week 100 minutes    Classes attended to date [place] 10/24/2020, 11:34 AM

## 2020-11-14 NOTE — Progress Notes (Signed)
[facility] PREP Weekly Session  Patient Details  Name: Beth Haynes MRN: [id number] Date of Birth: [birth year] Age: 75 y.o. PCP: Velna Hatchet, MD  There were no vitals filed for this visit.   [facility] Weekly seesion - 11/14/20 1200       [facility] "PREP" Location   [facility] "PREP" Location [facility]      Weekly Session   Topic Discussed Stress management and problem solving   chair yoga and meditation   Minutes exercised this week 40 minutes    Classes attended to date 6           Class held on 11/13/20  Barnett Hatter 11/14/2020, 12:40 PM

## 2020-11-17 ENCOUNTER — Other Ambulatory Visit: Payer: Self-pay | Admitting: Cardiovascular Disease

## 2020-11-21 NOTE — Progress Notes (Signed)
[facility] PREP Weekly Session  Patient Details  Name: Beth Haynes MRN: [id number] Date of Birth: January 31, 1946 Age: 75 y.o. PCP: Velna Hatchet, MD  Vitals:     [facility] Weekly seesion - 11/21/20 1100       [facility] "PREP" Location   [facility] "PREP" [facility]      Weekly Session   Topic Discussed Expectations and non-scale victories    Minutes exercised this week --   not charted however did >30 min while in class   Classes attended to date 7           Class on 11/20/20 PT reported weight as 165lbs. Will reweigh when next in class   Barnett Hatter 11/21/2020, 11:46 AM

## 2020-12-12 NOTE — Progress Notes (Signed)
[facility] PREP Weekly Session  Patient Details  Name: Beth Haynes MRN: [id number] Date of Birth: 02-Jun-1945 Age: 75 y.o. PCP: Velna Hatchet, MD  Vitals:   12/11/20 1430  Weight: 178 lb (80.7 kg)     [facility] Weekly seesion - 12/12/20 1700       [facility] "PREP" Location   [facility] "PREP" Location [facility]      Weekly Session   Topic Discussed Calorie breakdown    Minutes exercised this week 180 minutes    Classes attended to date 40            Class held on 12/11/20 Barnett Hatter 12/12/2020, 5:50 PM

## 2020-12-19 NOTE — Progress Notes (Signed)
[facility] PREP Weekly Session  Patient Details  Name: Beth Haynes MRN: [id number] Date of Birth: 01/04/1946 Age: 75 y.o. PCP: Velna Hatchet, MD  Vitals:   12/18/20 1430  Weight: 169 lb (76.7 kg)     [facility] Weekly seesion - 12/19/20 1700       [facility] "PREP" Location   [facility] "PREP" Location [facility]      Weekly Session   Topic Discussed Hitting roadblocks    Minutes exercised this week 90 minutes    Classes attended to date 15           Class held on 12/18/20  Barnett Hatter 12/19/2020, 5:08 PM

## 2020-12-19 NOTE — Progress Notes (Unsigned)
[facility] PREP Weekly Session  Patient Details  Name: Beth Haynes MRN: [id number] Date of Birth: 1945-02-17 Age: 75 y.o. PCP: Velna Hatchet, MD  Vitals:   12/18/20 1430  Weight: 169 lb (76.7 kg)     [facility] Weekly seesion - 12/19/20 1700       [facility] "PREP" Location   [facility] "PREP" Location [facility]      Weekly Session   Topic Discussed Hitting roadblocks    Minutes exercised this week 90 minutes    Classes attended to date 15           Class held on 12/18/20  Barnett Hatter 12/19/2020, 5:04 PM

## 2020-12-21 ENCOUNTER — Telehealth: Payer: Self-pay | Admitting: Neurology

## 2020-12-21 ENCOUNTER — Encounter: Payer: Self-pay | Admitting: Neurology

## 2020-12-21 ENCOUNTER — Ambulatory Visit: Payer: Medicare Other | Admitting: Neurology

## 2020-12-21 VITALS — BP 152/99 | HR 71 | Ht 63.0 in | Wt 180.0 lb

## 2020-12-21 DIAGNOSIS — G3184 Mild cognitive impairment, so stated: Secondary | ICD-10-CM

## 2020-12-21 DIAGNOSIS — G4719 Other hypersomnia: Secondary | ICD-10-CM | POA: Insufficient documentation

## 2020-12-21 DIAGNOSIS — G4731 Primary central sleep apnea: Secondary | ICD-10-CM | POA: Diagnosis not present

## 2020-12-21 DIAGNOSIS — I4819 Other persistent atrial fibrillation: Secondary | ICD-10-CM | POA: Diagnosis not present

## 2020-12-21 DIAGNOSIS — I5032 Chronic diastolic (congestive) heart failure: Secondary | ICD-10-CM | POA: Diagnosis not present

## 2020-12-21 DIAGNOSIS — Z789 Other specified health status: Secondary | ICD-10-CM

## 2020-12-21 MED ORDER — ALPRAZOLAM 0.25 MG PO TABS: 0.2500 mg | ORAL_TABLET | Freq: Every evening | ORAL | 0 refills | Status: DC | PRN

## 2020-12-21 NOTE — Telephone Encounter (Signed)
[employer] medicare order sent to [facility], [facility] they will reach out to the patient to schedule.  

## 2020-12-21 NOTE — Progress Notes (Signed)
SLEEP MEDICINE CLINIC   Provider:  Larey Seat, M D  Primary Care Physician:    Referring Provider: Velna Hatchet, MD     HPI: This is a new consult  for Beth Haynes , following a 3 year hiatus, today is 12-21-2020. The patient is seen here with her husband, who is concerned about her excessive daytime sleepiness.  Her husband is almost deaf, but reports he could feel her pauses in breathing, when she is on her v back. He also wanted her memory to be evaluated.  He is worried about her memory loss.  She is reportedly having difficulties with the TV control. She herself has no concern about her memory: She has atrial fibrillation. In 2019 had a fall with brain hemarrhage.  She sleeps all the time- Epworth 22/ 24 points.  No data on her ASV.  Mr. Pennock  has made arrangements for a private trainer and buys her multivitamins.  She was admitted in April 2022 ; ED visit; depression/anxiety, chronic diastolic CHF, hyperlipidemia presented to [facility] P4/5 with right-sided epistaxis.  Multiple attempts to control bleeding failed including cautery and packing and transferred to [facility] seen by ENT Epistat was placed and patient also had A. fib with RVR and hypotensive needing Cardizem drip and ICU admission. Her hemoglobin remained stable overall although some drop.  Monitor in ICU, on 4/8 nasal packing was removed.  Patient appears fairly ill frail needing PT eval blood pressure soft so discharge was held and transferred to [facility] service. Patient has been lethargic sleepy sometimes confused some- BP has been soft, and labs showed AKI AKI was given iv boluses with improvement in her symptoms Monitored after starting eliquis on 4/11 and did well no nose bleeding and seen thsi am and is stable for discharge home, anxious to go home soon   Discharge Diagnoses:  Right-sided severe epistaxis in the setting of anticoagulation.  Epistal removed  By ENT 4/8. ENT advised to hold patient's Xarelto for  couple of days-I have discussed with her cardiologist Dr. Acie Fredrickson- recommended to switch to Eliquis for less bleeding risk -started 4/11-monitored overnight to make sure patient has no recurrence of nasal epistaxis.  Hemoglobin overall stable although mild acute blood loss anemia currently at 2.3 g.     Chronic A. fib with RVR: Rate currently controlled, patient has been hypotensive/soft BP-that improved with IV fluid bolus and after stopping losartan.  Cont on her Cardizem, metoprolol ( increased to 100 mg) .There is question whether patient was taking her medication appropriately given that she presented with hypertension and uncontrolled RVR-since starting her meds blood pressure has been soft now.  Xarelto on hold 2/2 severe epistaxis-I have discussed with her cardiologist Dr. Acie Fredrickson- recommended switching to Eliquis.   Hypotension/soft blood pressure:s/p IV fluids and off losartan.  Stable, continue Cardizem, metoprolol for A. Fib and dose was increased to 100 mg. .   Lethargy/possible acute metabolic encephalopathy in the setting of soft blood pressure hypotension dehydration: This has resolved with improvement in blood pressure and with IV hydration.ABG showed no CO2 retention.  Appears anxious and forgetful.  Provide supportive care   AUQ:JFHLKTG creatinine was elevated to 1.3 from 0.8-subsequently improved with IV fluids.  Monitor         RV 11-04-2017, patient had a bad fall 10-07-2017 , presented to ED. She was walking outdoors in [place] and she stumbled over a pump outlet, her shoe strings were caught, she fell. She bruised her left temple. She  went to Dr. Ardeth Perfect who ordered an MRI which showed a small subdural. She was confused , and bradycardic. Her bleed was in the basal ganglia - and no mass effect was seen. She went to the ED and seen by  Dr. Madison Hickman and Dr. Erlinda Hong and was observed for 24 hours , d/c on the home medication.  Her left eye swell shut- she remains bradycardic and noted  she is still feeling as if walking on egg-crates, dizziness. She had some diplopia . She has incontinence of urine, had it before now worse! She has been seen at [facility] urology before, but was not " retrained".  She is sleepier. 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     RV from 05-06-2017, for Beth Haynes seen here with her husband.  Beth Haynes is a 75 y.o. female , who has been for the first time ever hospitalized in January of this year.  She had presented to her primary care physician who referred her by ambulance across the road to the hospital.   Until then she had used her ASV compliantly, after 3 days in the hospital for a cardiac workup she returned home. The physician asked her orientation questions, and she apparently couldn't answer- was diagnosed with dementia ! Atrial fibrillation, CHF and she underwent cardioversion, remains anticoagulated. She is back in a fib. She developed urinary incontinence, nocturia has slowed down. She still struggles with the CPAP since her admission- unsure why- but I noticed she lost a lot of weight- 50 pounds and she may not need the CPAP or not at current settings. There is no evidence of dementia now, may be it was delirium? She could not tolerate the large CPAP mask- no longer fits.  She has returned to the Eson nasal Mask in medium.    ASV is set at 5 cm EPAP, minimum pressure support of  3 cm and maximum 15 cm water pressure. AHI is 18.6 on this setting,  high air leaks. 05-04-2017. HST will be ordered to confirm to need for CPAP or not.      I have the pleasure of seeing Beth Haynes today on 10/30/2016 in a follow-up visit after CPAP titration performed on 06/25/2016, the patient was titrated first to CPAP but then this was switched to a BiPAP setting which she tolerated better. She did very well with a ASV setting of 5 cm EPAP, a minimum pressure support of 3 cm and a maximum pressure support of 15 cm. She  also had less and less arousals as the night went on not just due to respiratory factors but also due to an increased oxygen nadir. She was fitted with an Eson nasal mask. She tolerated ASV much better than BiPAP. I have now the pleasure of seeing the first compliance visit the patient has used the machine 83% for the last 30 days over 4 hours with an average user time of 6 hours and 7 minutes, ASV settings as quoted above her residual AHI is 4.6 of which 3.5 by hypopneas.      Original Consult: seen here as in a referral from Dr. Ardeth Perfect for a new sleep apnea evaluation.   Chief complaint according to patient : "I was diagnosed with apnea but could not get CPAP to work right". Beth Haynes husband is also a patient of my sleep practice, she was referred by her cardiologist, Dr. Cathie Olden, for sleep evaluation to the [facility]  Long sleep lab. She reports that atrial fibrillation was her main risk factor. In addition she had hypertension and a body mass index exceeding 30. Atrial fibrillation has been persistent, she is chronically anticoagulated on Eloquis. The patient reportedly has a cardiac valve regurgitation, diagnosed by echocardiogram but I do not have the original diagnostic tests available.The patient also has a history of hyperlipidemia, panic attacks,, irritable bowel syndrome, hypertension, urinary incontinence.   Sleep habits are as follows: The patient likes to read and she goes to bed but she falls asleep very promptly and feels that she can sleep uninterrupted through the night. She remembers vivid, beautiful dreams not nightmarish in character but colorful and very interesting.  h she shares the bedroom with her husband , who reports her to snore very, very loudly. He is hard of hearing and not normally bothered.  She usually retreats to her bedroom by about 10 PM starts to read and is asleep within 15-30 minutes. She prefers to sleep on her back and on her side. She sleeps on 2 thin, firm  pillows.Her husband reports that she seems to be gasping for air and still snoring while using CPAP.  Sleep medical history and family sleep history: Beth Haynes maternal grandmother died at age 57 of a sudden cardiac death, her mother collapsed and fell hitting her had never regaining consciousness died at age 43. Beth Haynes has undergone a tonsillectomy in 1963, she had frequent falls and broken both ankles, left wrist and left collarbone. Social history: married, retired, does not drink caffeine, lifelong non smoker, ETOH seldomly.   Her cholesterol and her last labs from April was 188 total, triglycerides 101, HDL was 44, creatinine was 0.7 liver function tests only marginal elevation of ALT at 41 units per liter was noted. She is mildly leukopenic at 3.97K, but she does have no evidence of anemia. TSH was in normal limits. I reviewed the patient's previous to sleep studies she was admitted for a split night polysomnography on 01/11/2015. The patient had frequent hypopneas and apneas the RDI was 56.7 which is very high. She did not have Cheyne-Stokes breathing of central apneas. She seemed to have slept all night in supine sleep - She was titrated to CPAP and establishing a high pressure of 19 cm water,  and her AHI did not respond it remained in the 2 digits. Her AHI was 65.5 on CPAP of 19 cm water she was changed to bilevel and remained with an AHI in the 60s under 23/19 cm water she was asked back for full night BiPAP titration on 04/11/2015 and her RDI exacerbated to 44.5;  this same procedure of a bilevel titration again to a pressure of 19/15 cm water resulted in a residual AHI of 26.7.  Oxygen nadir was 90% saturation.  Overall,  the patient barely slept and she continued to have difficulties using the machine at home. An optimal pressure was never identified.  Review of Systems:  Out of a complete 14 system review, the patient complains of only the following symptoms, and all other reviewed  systems are negative. Snoring. Hypersomnia, excessive sleepiness. Epworth score 12 with vivid dreams now on ASV reduced to 12/24, Fatigue severity score 24 points.  r urinary incontinence, thalamic/ basal ganglia bleed- sleepier- diplopia.    No longer driving  EDS , depression , memory loss,   Epistaxis.    How likely are you to doze in the following situations: 0 = not likely, 1 = slight chance, 2 =  moderate chance, 3 = high chance  Sitting and Reading? Watching Television? Sitting inactive in a public place (theater or meeting)? Lying down in the afternoon when circumstances permit? Sitting and talking to someone? Sitting quietly after lunch without alcohol? In a car, while stopped for a few minutes in traffic? As a passenger in a car for an hour without a break? No longer driving.   Total = 22   Social History   Socioeconomic History   Marital status: Married    Spouse name: Jeneen Rinks   Number of children: Not on file   Years of education: Not on file   Highest education level: Not on file  Occupational History   Not on file  Tobacco Use   Smoking status: Never   Smokeless tobacco: Never  Vaping Use   Vaping Use: Never used  Substance and Sexual Activity   Alcohol use: No   Drug use: No   Sexual activity: Not on file  Other Topics Concern   Not on file  Social History Narrative   Lives with husband   Right handed   Caffeine: none   Social Determinants of Health   Financial Resource Strain: Not on file  Food Insecurity: Not on file  Transportation Needs: Not on file  Physical Activity: Not on file  Stress: Not on file  Social Connections: Not on file  Intimate Partner Violence: Not on file    Family History  Problem Relation Age of Onset   Heart failure Mother    Heart attack Father    Hypertension Maternal Grandmother    Heart attack Paternal Grandfather    Breast cancer Maternal Aunt     Past Medical History:  Diagnosis Date   Atrial  fibrillation ([place])    Basal cell carcinoma 07/23/2018   right cheekbone-[id number]   IBS (irritable bowel syndrome)    Obesity (BMI 30-39.9) 07/11/2015   OSA (obstructive sleep apnea) 01/17/2015   Severe with AHI 35.8/hr   Panic attacks    Urinary incontinence, nocturnal enuresis     Past Surgical History:  Procedure Laterality Date   CARDIOVERSION N/A 04/02/2017   Procedure: CARDIOVERSION;  Surgeon: Skeet Latch, MD;  Location: [facility];  Service: Cardiovascular;  Laterality: N/A;   SKIN BIOPSY N/A 07/23/2018   mid upper back-moderate-0 tmt   TONSILLECTOMY      Current Outpatient Medications  Medication Sig Dispense Refill   budesonide (ENTOCORT EC) 3 MG 24 hr capsule Take 9 mg by mouth every morning.     Cholecalciferol 1.25 MG (50000 UT) TABS Take 50,000 Units by mouth every Tuesday.     diltiazem (CARDIZEM CD) 360 MG 24 hr capsule TAKE 1 CAPSULE BY MOUTH DAILY. (Patient taking differently: Take 360 mg by mouth daily.) 90 capsule 3   sertraline (ZOLOFT) 100 MG tablet Take 200 mg by mouth at bedtime.      apixaban (ELIQUIS) 5 MG TABS tablet Take 1 tablet (5 mg total) by mouth 2 (two) times daily. 180 tablet 1   metoprolol succinate (TOPROL-XL) 100 MG 24 hr tablet Take 1 tablet (100 mg total) by mouth daily. Take with or immediately following a meal. 30 tablet 0   No current facility-administered medications for this visit.    Allergies as of 12/21/2020 - Review Complete 12/21/2020  Allergen Reaction Noted   Sulfa antibiotics Other (See Comments) 08/24/2014   Codeine  10/06/2019   Epinephrine Other (See Comments) 06/12/2016    Vitals: BP (!) 152/99   Pulse 71  Ht 5\' 3"  (1.6 m)   Wt 180 lb (81.6 kg)   BMI 31.89 kg/m  Last Weight:  Wt Readings from Last 1 Encounters:  12/21/20 180 lb (81.6 kg)   GNF:AOZH mass index is 31.89 kg/m.     Last Height:   Ht Readings from Last 1 Encounters:  12/21/20 5\' 3"  (1.6 m)    Physical exam:  General: The patient is awake,  alert and appears not in acute distress. The patient is well groomed. Head: Normocephalic, atraumatic. Neck is supple. Mallampati 2.   neck circumference:15. Nasal airflow congested, but patent, TMJ click is not evident. Retrognathia is not seen.  Cardiovascular: irregular rate and rhythm, without  murmurs or carotid bruit, and without distended neck veins. Respiratory: Lungs are clear to auscultation. Skin:  Without evidence of edema, or rash Trunk: BMI is elevated. The patient's posture is erect.  Neurologic exam : The patient is awake and alert, oriented to place and time.  She is tearful.  Memory : Montreal Cognitive Assessment  12/21/2020  Visuospatial/ Executive (0/5) 3  Naming (0/3) 3  Attention: Read list of digits (0/2) 2  Attention: Read list of letters (0/1) 0  Attention: Serial 7 subtraction starting at 100 (0/3) 3  Language: Repeat phrase (0/2) 2  Language : Fluency (0/1) 0  Abstraction (0/2) 2  Delayed Recall (0/5) 3  Orientation (0/6) 3  Total 21   12-21-2020:  mini-mental status exam 30/ 30 points.    GDS: 7/ 15 , clinical depression.  Speech is fluent,  without dysarthria, dysphonia or aphasia.  Mood and affect are tearful, anxious, upset-   Cranial nerves: Pupils are equal and briskly reactive to light. Extraocular movements  in vertical and horizontal planes intact and without nystagmus. Visual fields by finger perimetry are intact. Hearing intact  Facial sensation intact to fine touch. Facial motor strength is symmetric and tongue and uvula move midline.   Shoulder shrug was symmetrical.  Strength within normal limits. Stance is stable and normal.  Deep tendon reflexes: in the  upper and lower extremities are brisk, mostly on the left -intact. Babinski maneuver response is downgoing.   Assessment:  After physical and neurologic examination, review of laboratory studies,  Personal review of imaging studies, reports of other /same  Imaging studies, results of  polysomnography and / or neurophysiology testing and pre-existing records as far as provided in visit., my assessment is   1) unable to use ASV, unable to use the TV remote-  apraxia, but she got 3/ 5 words right.  Has been depressed, sleepy and afraid. 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 MMSE next time. 30/ 30 today  2019 fall related traumatic bleed, not sub-dural but basal ganglia.  Right sided bleed is explaining her sleepiness-  but worried about incontinence.   2)Will need a repeat MRI brain- will need to rule out increased pressure on the brain., atrophy, causes of visio spatial impairment.  MMSE from now on.     In summary, I have not addressed her sleeping disorder today - Beth Haynes has several risk factors and obviously has been diagnosed with severe sleep apnea but she could not respond to CPAP nor BiPAP.  She is now on ASV and initially much better- but after her cardiac decompensation and hopital admission lost 50 pounds - and now her mask is not fitting, she I has high air leaks, high residual AHI of 18 plus.  She has treatment  emergent central sleep apnea.   She is unable to set ASV up by herself, and she needs this assistance .    I spent more than 30 minutes of face to face time with the patient. Greater than 50% of time was spent in counseling and coordination of care. We have discussed the diagnosis and differential and I answered the patient's questions.    Plan:  Treatment plan and additional workup :  MRI brain, MMSE.  She is unable to set ASV up by herself, and she needs this assistance.   Patient agrees with cognitive work up. Marland Kitchen    Larey Seat, MD 68/37/2902, 11:15 AM   Certified in Neurology by ABPN Certified in [place] by [facility] [address] [place], [place] [address]

## 2020-12-22 LAB — COMPREHENSIVE METABOLIC PANEL
ALT: 16 IU/L (ref 0–32)
AST: 12 IU/L (ref 0–40)
Albumin/Globulin Ratio: 2.2 (ref 1.2–2.2)
Albumin: 4.2 g/dL (ref 3.7–4.7)
Alkaline Phosphatase: 82 IU/L (ref 44–121)
BUN/Creatinine Ratio: 14 (ref 12–28)
BUN: 12 mg/dL (ref 8–27)
Bilirubin Total: 0.7 mg/dL (ref 0.0–1.2)
CO2: 27 mmol/L (ref 20–29)
Calcium: 10.3 mg/dL (ref 8.7–10.3)
Chloride: 104 mmol/L (ref 96–106)
Creatinine, Ser: 0.87 mg/dL (ref 0.57–1.00)
Globulin, Total: 1.9 g/dL (ref 1.5–4.5)
Glucose: 91 mg/dL (ref 70–99)
Potassium: 3.9 mmol/L (ref 3.5–5.2)
Sodium: 142 mmol/L (ref 134–144)
Total Protein: 6.1 g/dL (ref 6.0–8.5)
eGFR: 69 mL/min/{1.73_m2} (ref >59)

## 2020-12-22 NOTE — Progress Notes (Signed)
All normal metabolic panel ! MRI with contrast is ordered.

## 2020-12-26 ENCOUNTER — Telehealth: Payer: Self-pay | Admitting: *Deleted

## 2020-12-26 NOTE — Telephone Encounter (Signed)
-----   Message from Larey Seat, MD sent at 12/22/2020  9:41 AM EST ----- All normal metabolic panel ! MRI with contrast is ordered.

## 2020-12-26 NOTE — Telephone Encounter (Signed)
Called and spoke with pt/husband about labs results. She has MRI scheduled at [facility] for 01/09/21 at [place] we will reach out about results once back. She verbalized understanding.

## 2021-01-01 ENCOUNTER — Telehealth: Payer: Self-pay | Admitting: Cardiovascular Disease

## 2021-01-01 MED ORDER — METOPROLOL SUCCINATE ER 100 MG PO TB24: 100.0000 mg | ORAL_TABLET | Freq: Every day | ORAL | 1 refills | Status: DC

## 2021-01-01 NOTE — Progress Notes (Signed)
[facility] PREP Evaluation  Patient Details  Name: Beth Haynes MRN: [id number] Date of Birth: 07-Mar-1945 Age: 75 y.o. PCP: Thayer Headings, MD  Vitals:   12/27/20 1537  BP: 110/60  Pulse: 61  SpO2: 95%  Weight: 181 lb 9.6 oz (82.4 kg)     [facility] Eval - 01/01/21 1500       [facility] "PREP" Location   [facility] "PREP" Location [facility]      Referral    Program Start Date --   Final PREP class 12/27/20     Measurement   Waist Circumference 42 inches    Hip Circumference 45 inches    Body fat 46.4 percent      Information for Trainer   Goals --   Encouraged to continue to walk and/or exercise most days of week     Mobility and Daily Activities   I find it easy to walk up or down two or more flights of stairs. 4    I have no trouble taking out the trash. 4    I do housework such as vacuuming and dusting on my own without difficulty. 3    I can easily lift a gallon of milk (8lbs). 4    I can easily walk a mile. 3    I have no trouble reaching into high cupboards or reaching down to pick up something from the floor. 3    I do not have trouble doing out-door work such as Armed forces logistics/support/administrative officer, raking leaves, or gardening. 1      Mobility and Daily Activities   I feel younger than my age. 3    I feel independent. 4    I feel energetic. 4    I live an active life.  2    I feel strong. 4    I feel healthy. 4    I feel active as other people my age. 3      How fit and strong are you.   Fit and Strong Total Score 46            Past Medical History:  Diagnosis Date   Atrial fibrillation ([place])    Basal cell carcinoma 07/23/2018   right cheekbone-[id number]   IBS (irritable bowel syndrome)    Obesity (BMI 30-39.9) 07/11/2015   OSA (obstructive sleep apnea) 01/17/2015   Severe with AHI 35.8/hr   Panic attacks    Urinary incontinence, nocturnal enuresis    Past Surgical History:  Procedure Laterality Date   CARDIOVERSION N/A 04/02/2017   Procedure: CARDIOVERSION;  Surgeon: Skeet Latch, MD;  Location: [facility];  Service: Cardiovascular;  Laterality: N/A;   SKIN BIOPSY N/A 07/23/2018   mid upper back-moderate-0 tmt   TONSILLECTOMY     Social History   Tobacco Use  Smoking Status Never  Smokeless Tobacco Never  Final date of PREP 12/27/20 Fit test: Cardio March: 140 Sit to stands: 5 Right arm bicep curl: 18 Unable to do single leg balance but did well with tandem test Completed 17 workouts and attended 11 education sessions   Barnett Hatter 01/01/2021, 3:40 PM

## 2021-01-01 NOTE — Telephone Encounter (Signed)
*  STAT* If patient is at the pharmacy, call can be transferred to refill team.   1. Which medications need to be refilled? (please list name of each medication and dose if known) metoprolol succinate (TOPROL-XL) 100 MG 24 hr tablet (Expired)  2. Which pharmacy/location (including street and city if local pharmacy) is medication to be sent to? [id number] - [place], [place] - [address] AT [address]  3. Do they need a 30 day or 90 day supply? 30 ds

## 2021-01-01 NOTE — Telephone Encounter (Signed)
Pt's medication was sent to pt's pharmacy as requested. Confirmation received.  °

## 2021-01-09 ENCOUNTER — Ambulatory Visit
Admission: RE | Admit: 2021-01-09 | Discharge: 2021-01-09 | Disposition: A | Discharge status: Home / Self Care | Payer: Medicare Other | Source: Ambulatory Visit | Attending: Neurology | Admitting: Neurology

## 2021-01-09 ENCOUNTER — Other Ambulatory Visit: Payer: Self-pay

## 2021-01-09 DIAGNOSIS — G4731 Primary central sleep apnea: Secondary | ICD-10-CM

## 2021-01-09 DIAGNOSIS — G4719 Other hypersomnia: Secondary | ICD-10-CM

## 2021-01-09 DIAGNOSIS — Z789 Other specified health status: Secondary | ICD-10-CM

## 2021-01-09 DIAGNOSIS — I4819 Other persistent atrial fibrillation: Secondary | ICD-10-CM

## 2021-01-09 DIAGNOSIS — G3184 Mild cognitive impairment, so stated: Secondary | ICD-10-CM

## 2021-01-09 DIAGNOSIS — I5032 Chronic diastolic (congestive) heart failure: Secondary | ICD-10-CM

## 2021-01-09 MED ORDER — GADOBENATE DIMEGLUMINE 529 MG/ML IV SOLN
15.0000 mL | Freq: Once | INTRAVENOUS | Status: AC | PRN
  Administered 2021-01-09: 15 mL via INTRAVENOUS

## 2021-01-12 ENCOUNTER — Telehealth: Payer: Self-pay | Admitting: Neurology

## 2021-01-12 DIAGNOSIS — G3184 Mild cognitive impairment, so stated: Secondary | ICD-10-CM

## 2021-01-12 NOTE — Telephone Encounter (Signed)
Pt's husband is asking pt be called with results to MRI as soon as they are available.

## 2021-01-14 NOTE — Progress Notes (Signed)
Penni Bombard, MD: Memory loss in a patient with right brain hemispheric lacunar strokes ( these were present in 2021). Small blood vessel disease is seen.   Multiple small bleeds most dominant in right brain-, also chronic and seen in 2021. 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 Left vertebral artery signal was dimmed, this blood vessel may have narrowed or have a blockage.   Plan is to: obtain a cerebrovascular doppler study.  Reduce antiplatelets if possible ( Dr Elmarie Shiley input is desired).Marland Kitchen

## 2021-01-15 ENCOUNTER — Telehealth: Payer: Self-pay | Admitting: Neurology

## 2021-01-15 ENCOUNTER — Other Ambulatory Visit: Payer: Self-pay | Admitting: Cardiovascular Disease

## 2021-01-15 DIAGNOSIS — G3184 Mild cognitive impairment, so stated: Secondary | ICD-10-CM

## 2021-01-15 NOTE — Telephone Encounter (Signed)
Called and spoke w/ pt. Relayed results per Dr. Edwena Felty note:  "Memory loss in a patient with right brain hemispheric lacunar strokes ( these were present in 2021). Small blood vessel disease is seen.   Multiple small bleeds most dominant in right brain-, also chronic and seen in 2021. 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 Left vertebral artery signal was dimmed, this blood vessel may have narrowed or have a blockage.    Plan is to: obtain a cerebrovascular doppler study.  Reduce antiplatelets if possible ( Dr Elmarie Shiley input is desired).."   He is agreeable to having wife proceed with cerebrovascular doppler study. Aware we will place order and they will be called to get this scheduled. Aware Dr. Brett Fairy awaiting answer from Dr. Acie Fredrickson about antiplatelet medication.

## 2021-01-15 NOTE — Telephone Encounter (Signed)
See result note.  

## 2021-01-15 NOTE — Telephone Encounter (Signed)
[employer] medicare order sent to [facility], [facility] they will reach out to the patient to schedule.  

## 2021-01-15 NOTE — Addendum Note (Signed)
Addended by: Wyvonnia Lora on: 01/15/2021 09:18 AM   Modules accepted: Orders

## 2021-01-15 NOTE — Telephone Encounter (Signed)
When you get a chance can you put new orders in a CT Angio Head w or wo contrast & CT Angio Neck w or wo contrast. It is two seperate test. Thank you

## 2021-01-16 ENCOUNTER — Ambulatory Visit
Admission: RE | Admit: 2021-01-16 | Discharge: 2021-01-16 | Disposition: A | Discharge status: Home / Self Care | Payer: Medicare Other | Source: Ambulatory Visit | Attending: Neurology | Admitting: Neurology

## 2021-01-16 ENCOUNTER — Other Ambulatory Visit: Payer: Medicare Other

## 2021-01-16 DIAGNOSIS — G3184 Mild cognitive impairment, so stated: Secondary | ICD-10-CM

## 2021-01-16 MED ORDER — IOPAMIDOL (ISOVUE-300) INJECTION 61%
100.0000 mL | Freq: Once | INTRAVENOUS | Status: AC | PRN
  Administered 2021-01-16: 100 mL via INTRAVENOUS

## 2021-01-18 ENCOUNTER — Telehealth: Payer: Self-pay | Admitting: *Deleted

## 2021-01-18 NOTE — Progress Notes (Signed)
IMPRESSION: Good outcome- no blockages or significant narrowing of brain blood vessels.  1. No emergent large vessel occlusion or hemodynamically significant stenosis of the head or neck. 85. 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  3. 1.8 cm incidental right thyroid nodule. Recommend thyroid US.   Mrs. Beth Haynes : I will send this to your PCP- You have Dr. Acie Fredrickson listed  who is your cardiologist. Do you have a PCP? Please tell us to whom to send this.     Electronically Signed   By: Cletus Gash.D.

## 2021-01-18 NOTE — Telephone Encounter (Signed)
Called and spoke w/ pt. Relayed results per Dr. Edwena Felty note. She will f/u with PCP about thyroid nodule found incidentally. She follows at [facility] Address: [address], [place], [place] [address] Phone: [phone]. I called and spoke w/ Lamia and got fax#: [phone].  I faxed copy of CT and lab results to them, received fax confirmation.

## 2021-01-18 NOTE — Telephone Encounter (Signed)
-----   Message from Larey Seat, MD sent at 01/18/2021 12:46 PM EST ----- IMPRESSION: Good outcome- no blockages or significant narrowing of brain blood vessels.  1. No emergent large vessel occlusion or hemodynamically significant stenosis of the head or neck. 40. 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  3. 1.8 cm incidental right thyroid nodule. Recommend thyroid US.   Mrs. Telleria : I will send this to your PCP- You have Dr. Acie Fredrickson listed  who is your cardiologist. Do you have a PCP? Please tell us to whom to send this.     Electronically Signed   By: Cletus Gash.D.

## 2021-01-26 ENCOUNTER — Telehealth: Payer: Self-pay | Admitting: Neurology

## 2021-01-26 NOTE — Telephone Encounter (Signed)
Pt's husband called wanting to speak to provider. Husband did not give reason why just that he would like a call back.

## 2021-01-29 NOTE — Telephone Encounter (Signed)
Called and spoke with husband to see what concerns he has. He requested I go over CT results with him. I relayed results per Dr. Edwena Felty note. He will make sure she f/u w/ PCP about thyroid nodule. Scheduled f/u for 05/07/21 at 10:30a.

## 2021-02-06 NOTE — Telephone Encounter (Signed)
I attempted to call [facility] and was not able to get a person on the phone. I have reached out to their referral team contact and she provided me with the NP emails of both NP's involved in pt and her husbands care. I have sent email to the NP to follow up on the care and will await to hear back from them.

## 2021-02-06 NOTE — Telephone Encounter (Signed)
Pt's husband called, asking what he should do next about his wife. Pt's husband requesting a call back.

## 2021-02-06 NOTE — Telephone Encounter (Signed)
Called the husband back. He states that he continues to get the run around between the primary care office and our office. The husband states that he called the pcp about "thyroid test" and they told him that he can have that completed through our office since we have a lab corp here. I advised that it is not test that we are stating is needed as it is the patient should follow up with primary care because of the CT findings. Pt's husband is very frustrated and I think due to the language barrier (he is hard of hearing) this has caused some problems with communication. Advised the husband I will contact the [facility] pcp office in regards to him and his wifes care.

## 2021-02-07 ENCOUNTER — Other Ambulatory Visit: Payer: Self-pay | Admitting: Registered Nurse

## 2021-02-07 DIAGNOSIS — E041 Nontoxic single thyroid nodule: Secondary | ICD-10-CM

## 2021-02-07 NOTE — Telephone Encounter (Signed)
I was able to email the patient's Primary care NP to update and discuss the husbands concerns. Her response, "Thank you for your email. I'll have my care team reach out to the patient. I've only seen the patient twice and she hasn't followed up with me again. However, I agree that we need to proceed with the thyroid US. We'll handle from here regarding the thyroid work up."  **If the pt of her husband calls back please advise this information.

## 2021-02-14 ENCOUNTER — Ambulatory Visit
Admission: RE | Admit: 2021-02-14 | Discharge: 2021-02-14 | Disposition: A | Discharge status: Home / Self Care | Payer: Medicare Other | Source: Ambulatory Visit | Attending: Registered Nurse | Admitting: Registered Nurse

## 2021-02-14 DIAGNOSIS — E041 Nontoxic single thyroid nodule: Secondary | ICD-10-CM

## 2021-03-29 NOTE — Telephone Encounter (Signed)
This encounter was created in error - please disregard.

## 2021-04-20 ENCOUNTER — Other Ambulatory Visit: Payer: Self-pay | Admitting: Cardiovascular Disease

## 2021-04-23 ENCOUNTER — Other Ambulatory Visit: Payer: Self-pay | Admitting: Cardiovascular Disease

## 2021-05-07 ENCOUNTER — Other Ambulatory Visit: Payer: Self-pay

## 2021-05-07 ENCOUNTER — Encounter: Payer: Self-pay | Admitting: Neurology

## 2021-05-07 ENCOUNTER — Ambulatory Visit: Payer: Medicare Other | Admitting: Neurology

## 2021-05-07 VITALS — BP 163/102 | HR 68 | Ht 63.0 in | Wt 175.5 lb

## 2021-05-07 DIAGNOSIS — G3184 Mild cognitive impairment, so stated: Secondary | ICD-10-CM | POA: Diagnosis not present

## 2021-05-07 MED ORDER — DONEPEZIL HCL 5 MG PO TABS: 5.0000 mg | ORAL_TABLET | Freq: Every day | ORAL | 5 refills | Status: DC

## 2021-05-07 NOTE — Patient Instructions (Signed)
There are well-accepted and sensible ways to reduce risk for Alzheimers disease and other degenerative brain disorders . ? ?Exercise Daily Walk A daily 20 minute walk should be part of your routine. Disease related apathy can be a significant roadblock to exercise and the only way to overcome this is to make it a daily routine and perhaps have a reward at the end (something your loved one loves to eat or drink perhaps) or a personal trainer coming to the home can also be very useful. Most importantly, the patient is much more likely to exercise if the caregiver / spouse does it with him/her. In general a structured, repetitive schedule is best. ? ?General Health: Any diseases which effect your body will effect your brain such as a pneumonia, urinary infection, blood clot, heart attack or stroke. Keep contact with your primary care doctor for regular follow ups. ? ?Sleep. A good nights sleep is healthy for the brain. Seven hours is recommended. If you have insomnia or poor sleep habits we can give you some instructions. If you have sleep apnea wear your mask. ? ?Diet: Eating a heart healthy diet is also a good idea; fish and poultry instead of red meat, nuts (mostly non-peanuts), vegetables, fruits, olive oil or canola oil (instead of butter), minimal salt (use other spices to flavor foods), whole grain rice, bread, cereal and pasta and wine in moderation.Research is now showing that the MIND diet, which is a combination of The Mediterranean diet and the DASH diet, is beneficial for cognitive processing and longevity. Information about this diet can be found in The MIND Diet, a book by Doyne Keel, MS, RDN, and online at NotebookDistributors.si ? ?Finances, Power of Producer, television/film/video Directives: You should consider putting legal safeguards in place with regard to financial and medical decision making. While the spouse always has power of attorney for medical and financial issues in the  absence of any form, you should consider what you want in case the spouse / caregiver is no longer around or capable of making decisions.  ? ?The Alzheimers Association Position on Disease Prevention ? ?Can Alzheimer's be prevented? It's a question that continues to intrigue researchers and fuel new investigations. There are no clear-cut answers yet -- partially due to the need for more large-scale studies in diverse populations -- but promising research is under way. The Alzheimer's Association? is leading the worldwide effort to find a treatment for Alzheimer's, delay its onset and prevent it from developing.  ? ?What causes Alzheimer's? ?Experts agree that in the vast majority of cases, Alzheimer's, like other common chronic conditions, probably develops as a result of complex interactions among multiple factors, including age, genetics, environment, lifestyle and coexisting medical conditions. Although some risk factors -- such as age or genes -- cannot be changed, other risk factors -- such as high blood pressure and lack of exercise -- usually can be changed to help reduce risk. Research in these areas may lead to new ways to detect those at highest risk. ? ?Prevention studies ?A small percentage of people with Alzheimer's disease (less than 1 percent) have an early-onset type associated with genetic mutations. Individuals who have these genetic mutations are guaranteed to develop the disease. An ongoing clinical trial conducted by the Dominantly Inherited Alzheimer Network (DIAN), is testing whether antibodies to beta-amyloid can reduce the accumulation of beta-amyloid plaque in the brains of people with such genetic mutations and thereby reduce, delay or prevent symptoms. Participants in the trial are receiving antibodies (  or placebo) before they develop symptoms, and the development of beta-amyloid plaques is being monitored by brain scans and other tests. ? ?Another clinical trial, known as the A4 trial  (Anti-Amyloid Treatment in Asymptomatic Alzheimer's), is testing whether antibodies to beta-amyloid can reduce the risk of Alzheimer's disease in older people (ages 56 to 62) at high risk for the disease. The A4 trial is being conducted by the Alzheimer's Disease Cooperative Study. ? ?Though research is still evolving, evidence is strong that people can reduce their risk by making key lifestyle changes, including participating in regular activity and maintaining good heart health. Based on this research, the Alzheimer's Association offers 10 Ways to Love Your Brain -- a collection of tips that can reduce the risk of cognitive decline. ? ?Heart-head connection ? ?New research shows there are things we can do to reduce the risk of mild cognitive impairment and dementia. ? ?Several conditions known to increase the risk of cardiovascular disease -- such as high blood pressure, diabetes and high cholesterol -- also increase the risk of developing Alzheimer's. Some autopsy studies show that as many as 42 percent of individuals with Alzheimer's disease also have cardiovascular disease. ? ?A longstanding question is why some people develop hallmark Alzheimer's plaques and tangles but do not develop the symptoms of Alzheimer's. Vascular disease may help researchers eventually find an answer. Some autopsy studies suggest that plaques and tangles may be present in the brain without causing symptoms of cognitive decline unless the brain also shows evidence of vascular disease. More research is needed to better understand the link between vascular health and Alzheimer's. ? ?Physical exercise and diet ?Regular physical exercise may be a beneficial strategy to lower the risk of Alzheimer's and vascular dementia. Exercise may directly benefit brain cells by increasing blood and oxygen flow in the brain. Because of its known cardiovascular benefits, a medically approved exercise program is a valuable part of any overall wellness  plan. ? ?Current evidence suggests that heart-healthy eating may also help protect the brain. Heart-healthy eating includes limiting the intake of sugar and saturated fats and making sure to eat plenty of fruits, vegetables, and whole grains. No one diet is best. Two diets that have been studied and may be beneficial are the DASH (Dietary Approaches to Stop Hypertension) diet and the Mediterranean diet. The DASH diet emphasizes vegetables, fruits and fat-free or low-fat dairy products; includes whole grains, fish, poultry, beans, seeds, nuts and vegetable oils; and limits sodium, sweets, sugary beverages and red meats. A Mediterranean diet includes relatively little red meat and emphasizes whole grains, fruits and vegetables, fish and shellfish, and nuts, olive oil and other healthy fats. ? ?Social connections and intellectual activity ?A number of studies indicate that maintaining strong social connections and keeping mentally active as we age might lower the risk of cognitive decline and Alzheimer's. Experts are not certain about the reason for this association. It may be due to direct mechanisms through which social and mental stimulation strengthen connections between nerve cells in the brain. ? ?Head trauma ?There appears to be a strong link between future risk of Alzheimer's and serious head trauma, especially when injury involves loss of consciousness. You can help reduce your risk of Alzheimer's by protecting your head. ? Wear a seat belt ? Use a helmet when participating in sports ? "Fall-proof" your home ?  ?What you can do now ?While research is not yet conclusive, certain lifestyle choices, such as physical activity and diet, may help support brain  health and prevent Alzheimer's. Many of these lifestyle changes have been shown to lower the risk of other diseases, like heart disease and diabetes, which have been linked to Alzheimer's. With few drawbacks and plenty of known benefits, healthy lifestyle  choices can improve your health and possibly protect your brain. ? ?Learn more about brain health. ?You can help increase our knowledge by considering participation in a clinical study. Our free clinical trial matc

## 2021-05-07 NOTE — Progress Notes (Signed)
?SLEEP MEDICINE CLINIC ? ? ?Provider:  Larey Seat, M D  ?Primary Care Physician:   ? ?Referring Provider: Thayer Headings, MD  ? ? ? ?Interval history: Rv on 05-07-2021:  ? ?SALLI BODIN is seen today in a RV, 76 years old, and we see her today for cognitive concerns. Last visit was  tearful one and she was so upset about her husband.  28/30 points by MMSE today, with a peculiar loss of one point for year and one point consecutively for the date.  ?We discussed displaying a large calendar, and to have regular exercise. She already joined silver sneakers, has more endurance. She dreams more, she hydrates better and she lost 6 pounds.  ?I recommended the MIND diet and we discussing to start Aricept.   ? ? ? ? ? ? ? ? ?This is a new consult  for [name] , following a 3 year hiatus, today is 12-21-2020. ?The patient is seen here with her husband, who is concerned about her excessive daytime sleepiness.  ?Her husband is almost deaf, but reports he could feel her pauses in breathing, when she is on her v back. He also wanted her memory to be evaluated.  He is worried about her memory loss.  She is reportedly having difficulties with the TV control. She herself has no concern about her memory: ?She has atrial fibrillation. In 2019 had a fall with brain hemarrhage.  ?She sleeps all the time- Epworth 22/ 24 points.  ?No data on her ASV.  ?Mr. Bertha  has made arrangements for a private trainer and buys her multivitamins.  ?She was admitted in April 2022 ; ED visit; depression/anxiety, chronic diastolic CHF, hyperlipidemia presented to [facility] P4/5 with right-sided epistaxis.  Multiple attempts to control bleeding failed including cautery and packing and transferred to [facility] seen by ENT Epistat was placed and patient also had A. fib with RVR and hypotensive needing Cardizem drip and ICU admission. ?Her hemoglobin remained stable overall although some drop.  Monitor in ICU, on 4/8 nasal packing was removed.  Patient  appears fairly ill frail needing PT eval blood pressure soft so discharge was held and transferred to [facility] service. ?Patient has been lethargic sleepy sometimes confused some- BP has been soft, and labs showed AKI AKI was given iv boluses with improvement in her symptoms ?Monitored after starting eliquis on 4/11 and did well no nose bleeding and seen thsi am and is stable for discharge home, anxious to go home soon ?  ?Discharge Diagnoses:  ?Right-sided severe epistaxis in the setting of anticoagulation.  Epistal removed  By ENT 4/8. ENT advised to hold patient's Xarelto for couple of days-I have discussed with her cardiologist Dr. Acie Fredrickson- recommended to switch to Eliquis for less bleeding risk -started 4/11-monitored overnight to make sure patient has no recurrence of nasal epistaxis.  Hemoglobin overall stable although mild acute blood loss anemia currently at 2.3 g.   ?  ?Chronic A. fib with RVR: Rate currently controlled, patient has been hypotensive/soft BP-that improved with IV fluid bolus and after stopping losartan.  Cont on her Cardizem, metoprolol ( increased to 100 mg) .There is question whether patient was taking her medication appropriately given that she presented with hypertension and uncontrolled RVR-since starting her meds blood pressure has been soft now.  Xarelto on hold 2/2 severe epistaxis-I have discussed with her cardiologist Dr. Acie Fredrickson- recommended switching to Eliquis. ?  ?Hypotension/soft blood pressure:s/p IV fluids and off losartan.  Stable,  continue Cardizem, metoprolol for A. Fib and dose was increased to 100 mg. . ?  ?Lethargy/possible acute metabolic encephalopathy in the setting of soft blood pressure hypotension dehydration: This has resolved with improvement in blood pressure and with IV hydration.ABG showed no CO2 retention.  Appears anxious and forgetful.  Provide supportive care ?  ?IRJ:JOACZYS creatinine was elevated to 1.3 from 0.8-subsequently improved with IV fluids.   Monitor ? ? ? ? ? ? ? ? ?RV 11-04-2017, patient had a bad fall 10-07-2017 , presented to ED. She was walking outdoors in [place] and she stumbled over a pump outlet, her shoe strings were caught, she fell. She bruised her left temple. She went to Dr. Ardeth Perfect who ordered an MRI which showed a small subdural. She was confused , and bradycardic. Her bleed was in the basal ganglia - and no mass effect was seen. She went to the ED and seen by  Dr. Madison Hickman and Dr. Erlinda Hong and was observed for 24 hours , d/c on the home medication.  ?Her left eye swell shut- she remains bradycardic and noted she is still feeling as if walking on egg-crates, dizziness. She had some diplopia . She has incontinence of urine, had it before now worse! She has been seen at [facility] urology before, but was not " retrained".  ?She is sleepier. 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  ? ? ? ?RV from 05-06-2017, for Mrs. Parcell seen here with her husband.  ?ALYSS GRANATO is a 76 y.o. female , who has been for the first time ever hospitalized in January of this year.  She had presented to her primary care physician who referred her by ambulance across the road to the hospital.   Until then she had used her ASV compliantly, after 3 days in the hospital for a cardiac workup she returned home. The physician asked her orientation questions, and she apparently couldn't answer- was diagnosed with dementia ! Atrial fibrillation, CHF and she underwent cardioversion, remains anticoagulated. She is back in a fib. ?She developed urinary incontinence, nocturia has slowed down. She still struggles with the CPAP since her admission- unsure why- but I noticed she lost a lot of weight- 50 pounds and she may not need the CPAP or not at current settings. There is no evidence of dementia now, may be it was delirium? She could not tolerate the large CPAP mask- no longer fits.  She has returned to the Eson nasal Mask in medium.  ?   ?ASV is set at 5 cm EPAP, minimum pressure support of  3 cm and maximum 15 cm water pressure. AHI is 18.6 on this setting,  high air leaks. 05-04-2017. ?HST will be ordered to confirm to need for CPAP or not.   ? ? ? I have the pleasure of seeing Mrs. Winthrop today on 10/30/2016 in a follow-up visit after CPAP titration performed on 06/25/2016, the patient was titrated first to CPAP but then this was switched to a BiPAP setting which she tolerated better. She did very well with a ASV setting of 5 cm EPAP, a minimum pressure support of 3 cm and a maximum pressure support of 15 cm. She also had less and less arousals as the night went on not just due to respiratory factors but also due to an increased oxygen nadir. She was fitted with an Eson nasal mask. She tolerated ASV much better than BiPAP. I have now the pleasure  of seeing the first compliance visit the patient has used the machine 83% for the last 30 days over 4 hours with an average user time of 6 hours and 7 minutes, ASV settings as quoted above her residual AHI is 4.6 of which 3.5 by hypopneas.  ? ? ? ? Original Consult: seen here as in a referral from Dr. Acie Fredrickson for a new sleep apnea evaluation.  ?Chief complaint according to patient : "I was diagnosed with apnea but could not get CPAP to work right". Mrs. Asmi Fugere husband is also a patient of my sleep practice, she was referred by her cardiologist, Dr. Cathie Olden, for sleep evaluation to the [place] sleep lab. She reports that atrial fibrillation was her main risk factor. In addition she had hypertension and a body mass index exceeding 30. Atrial fibrillation has been persistent, she is chronically anticoagulated on Eloquis. The patient reportedly has a cardiac valve regurgitation, diagnosed by echocardiogram but I do not have the original diagnostic tests available.The patient also has a history of hyperlipidemia, panic attacks,, irritable bowel syndrome, hypertension, urinary incontinence.  ? ?Sleep  habits are as follows: The patient likes to read and she goes to bed but she falls asleep very promptly and feels that she can sleep uninterrupted through the night. She remembers vivid, beautiful dreams not nightma

## 2021-06-04 ENCOUNTER — Encounter: Payer: Self-pay | Admitting: Cardiovascular Disease

## 2021-06-04 ENCOUNTER — Ambulatory Visit: Payer: Medicare Other | Admitting: Cardiovascular Disease

## 2021-06-04 VITALS — BP 148/86 | HR 80 | Ht 63.0 in | Wt 172.0 lb

## 2021-06-04 DIAGNOSIS — I4811 Longstanding persistent atrial fibrillation: Secondary | ICD-10-CM | POA: Diagnosis not present

## 2021-06-04 DIAGNOSIS — I10 Essential (primary) hypertension: Secondary | ICD-10-CM

## 2021-06-04 NOTE — Patient Instructions (Signed)
Medication Instructions:  ?Your physician recommends that you continue on your current medications as directed. Please refer to the Current Medication list given to you today. ? ?*If you need a refill on your cardiac medications before your next appointment, please call your pharmacy* ? ? ?Lab Work: ?NONE ?If you have labs (blood work) drawn today and your tests are completely normal, you will receive your results only by: ?MyChart Message (if you have MyChart) OR ?A paper copy in the mail ?If you have any lab test that is abnormal or we need to change your treatment, we will call you to review the results. ? ? ?Testing/Procedures: ?NONE ? ? ?Follow-Up: ?At [facility], you and your health needs are our priority.  As part of our continuing mission to provide you with exceptional heart care, we have created designated Provider Care Teams.  These Care Teams include your primary Cardiologist (physician) and Advanced Practice Providers (APPs -  Physician Assistants and Nurse Practitioners) who all work together to provide you with the care you need, when you need it. ? ?We recommend signing up for the patient portal called "MyChart".  Sign up information is provided on this After Visit Summary.  MyChart is used to connect with patients for Virtual Visits (Telemedicine).  Patients are able to view lab/test results, encounter notes, upcoming appointments, etc.  Non-urgent messages can be sent to your provider as well.   ?To learn more about what you can do with MyChart, go to [email or web address].   ? ?Your next appointment:   ?1 year(s) ? ?The format for your next appointment:   ?In Person ? ?Provider:   ?Mertie Moores, MD  or Robbie Lis, PA-C, Christen Bame, NP, or Richardson Dopp, PA-C      ? ?If primary card or EP is not listed click here to update    :1}  ? ?  ? ?Important Information About Sugar ? ? ? ? ?  ?

## 2021-06-04 NOTE — Progress Notes (Signed)
? ?Cardiology Office Note ? ? ?Date:  06/04/2021  ? ?ID:  Beth Haynes, DOB September 19, 1945, MRN [id number] ? ?PCP:  Manfred Shirts, PA  ?Cardiologist:   Mertie Moores, MD  ? ?Chief Complaint  ?Patient presents with  ? Congestive Heart Failure  ?   ?  ? Atrial Fibrillation  ? ?Problem List ?1.  Persistent atrial fib ?2. Obstructive sleep apnea:   Having difficulty in getting a CPAP mask to fit.  ?  ?  ?Beth Haynes is a 76 y.o. female who presents for evaluation of possible atrial fib. ?She woke up with dizziness, room was spinning  ?Was seen by PA. ?Presented to the ER .  Head MRI was normal.  ?Symptoms sound like vertigo  ? ?She still has some occasional episodes of dizziness.  ? ?Has occasional episodes of palpitations ,  She thinks it may be related to panic attacks. ?No CP or dyspnea. ?Does not get any regular exercise.  ? ?[place] in [place]  ? ?Dec. 6, 2016: ? ?Beth Haynes is seen today for follow up of her atrial fib.  ?BP is elevated today .   Ate some salty foods this weekend .  ? ?July 20, 2015: ? ?Beth Haynes is seen back today .  Has PAF and OSA. ?She does not have a good CPAP mask that fits.  ? ?Dec. 13, 2017: ? ?Was not able to find a CPAP that worked .   Could never get used to it  ?BP is elevated.  ?Has been eating more salt than usual - she and her husband eat popcorn every night.  ? ?February 26, 2017: ?Was hospitalized with CHF,  And Afib .  last week. ?Was not taking her meds and developed CHF  ?Is on Eliquis but is changing to Xarelto 20 mg a day  ? ?Mar 05, 2017: ? ?Beth Haynes is doing better.  ?Still in AF,  HR is better controlled.  ?Was not taking the eliquis as directed initialy but has  ? ?July 23, 2017:  ?Beth Haynes is seen back today for history of paroxysmal atrial fibrillation.  She was seen by Richardson Dopp in March and was set up for cardioversion.  She was successfully cardioverted at that time but when she showed up for follow-up visit she was already back in atrial fibrillation.  We have  adopted a rate control and anticoagulation strategy at that time.  She continues on  Xarelto.  ?Getting some [facility] ,  [facility] ?[facility] 06, 2019:  ?We discussed Beth Haynes,  Beth Haynes  ?They go up every weekend .  ? ?Beth Haynes is seen today for follow-up of her atrial fibrillation.  She previously had paroxysmal atrial fibrillation but now it appears that she has persistent atrial fibrillation.  She has had cardioversions in the past but has gone back into atrial fibrillation several times.  At this point we have developed a rate control and anticoagulation strategy. ? ?Has been exercising but has not lost weight. ?clothers are looser  ?Wt. Is 197 ( up 17 lbs )  ?Eats ice cream every night .  ? ?Nov. 29, 2021: ?Beth Haynes is seen for her persistent atrial fib. ?Wt. Is   190 lbs  ( down 7 lbs from last year)  ?We discussed a new beer at [facility] .  ?Is walking daily . ? ?Jun 16, 2020: ?Beth Haynes is seen for follow up visit ?She  was admitted recently with epistazis.   Was on Xarelto ?Nose was packed by Dr. Redmond Baseman ?Held xarelto for several days  ?Advised restarting eliquis after her nose healed.  ?Is back in atrial fib  ? ?Is working out at [employer] ?Husband says she is sleeping constantly  ?I advised him to brink this up with his primary MD  ?She is unsteady  On her feet.   ?I advised a cane or walker  ? ?June 04, 2021 ? ?Beth Haynes is seen for follow up of her CHF  ?They go up to [place] frequently - their place is 1 mile from [facility].  ?Is exercising  ?Advised her to avoid salt .  ?She has a new [employer] now .  ? ? ?Past Medical History:  ?Diagnosis Date  ? Atrial fibrillation ([place])   ? Basal cell carcinoma 07/23/2018  ? right cheekbone-[id number]  ? IBS (irritable bowel syndrome)   ? Obesity (BMI 30-39.9) 07/11/2015  ? OSA (obstructive sleep apnea) 01/17/2015  ? Severe with AHI 35.8/hr  ? Panic attacks   ? Urinary incontinence, nocturnal enuresis   ? ? ?Past Surgical History:  ?Procedure  Laterality Date  ? CARDIOVERSION N/A 04/02/2017  ? Procedure: CARDIOVERSION;  Surgeon: Skeet Latch, MD;  Location: [facility];  Service: Cardiovascular;  Laterality: N/A;  ? SKIN BIOPSY N/A 07/23/2018  ? mid upper back-moderate-0 tmt  ? TONSILLECTOMY    ? ? ? ?Current Outpatient Medications  ?Medication Sig Dispense Refill  ? apixaban (ELIQUIS) 5 MG TABS tablet Take 1 tablet (5 mg total) by mouth 2 (two) times daily. 180 tablet 1  ? budesonide (ENTOCORT EC) 3 MG 24 hr capsule Take 9 mg by mouth every morning.    ? Cholecalciferol 1.25 MG (50000 UT) TABS Take 50,000 Units by mouth every Tuesday.    ? diltiazem (CARDIZEM CD) 360 MG 24 hr capsule TAKE 1 CAPSULE BY MOUTH DAILY. Keep appointment in April for any future refills. 90 capsule 0  ? donepezil (ARICEPT) 5 MG tablet Take 1 tablet (5 mg total) by mouth at bedtime. 30 tablet 5  ? metoprolol succinate (TOPROL-XL) 100 MG 24 hr tablet Take 1 tablet (100 mg total) by mouth daily. Take with or immediately following a meal. 90 tablet 1  ? ALPRAZolam (XANAX) 0.25 MG tablet Take 1 tablet (0.25 mg total) by mouth at bedtime as needed for anxiety (pre MRI). (Patient not taking: Reported on 06/04/2021) 6 tablet 0  ? sertraline (ZOLOFT) 100 MG tablet Take 200 mg by mouth at bedtime.  (Patient not taking: Reported on 06/04/2021)    ? ?No current facility-administered medications for this visit.  ? ? ?Allergies:   Sulfa antibiotics, Codeine, and Epinephrine  ? ? ?Social History:  The patient  reports that she has never smoked. She has never used smokeless tobacco. She reports that she does not drink alcohol and does not use drugs.  ? ?Family History:  The patient's family history includes Breast cancer in her maternal aunt; Heart attack in her father and paternal grandfather; Heart failure in her mother; Hypertension in her maternal grandmother.  ? ? ?ROS: Noted in current history, otherwise negative. ? ?Physical Exam: ?Blood pressure (!) 148/86, pulse 80, height '5\' 3"'$   (1.6 m), weight 172 lb (78 kg), SpO2 92 %. ? ?GEN:  Well nourished, well developed in no acute distress ?HEENT: Normal ?NECK: No JVD; No carotid bruits ?LYMPHATICS: No lymphadenopathy ?CARDIAC: irreg. Irreg.  ?RESPIRATORY:  Clear to auscultation without rales, wheezing or  rhonchi  ?ABDOMEN: Soft, non-tender, non-distended ?MUSCULOSKELETAL:  No edema; No deformity  ?SKIN: Warm and dry ?NEUROLOGIC:  Alert and oriented x 3 ? ? ? ?EKG:     June 04, 2021: Atrial fibrillation with a heart rate of 80.  Poor R wave progression-likely due to lead placement abnormality.  Cannot rule out septal infarction. ?  ?Recent Labs: ?12/21/2020: ALT 16; BUN 12; Creatinine, Ser 0.87; Potassium 3.9; Sodium 142  ? ? ?Lipid Panel ?   ?Component Value Date/Time  ? CHOL 127 02/21/2017 0836  ? TRIG 66 02/21/2017 0836  ? HDL 37 (L) 02/21/2017 0836  ? CHOLHDL 3.4 02/21/2017 0836  ? VLDL 13 02/21/2017 0836  ? [place] 77 02/21/2017 0836  ? ?  ? ?Wt Readings from Last 3 Encounters:  ?06/04/21 172 lb (78 kg)  ?05/07/21 175 lb 8 oz (79.6 kg)  ?12/27/20 181 lb 9.6 oz (82.4 kg)  ?  ? ? ?Other studies Reviewed: ?Additional studies/ records that were reviewed today include: . ?Review of the above records demonstrates:  ? ? ?ASSESSMENT AND PLAN: ? ?1.  Vertigo:       ? ?2.    atrial fib:     persistent AF.  Cont meds.  ? ? 3. Obstructive sleep apnea:        ? ?4. Essential HTN:     -  still eating salt .  , advised salt restriction,  advised exercise  ? ?5. Obesity:    ?  ? ?The following changes have been made:  no change ? ?Labs/ tests ordered today include:  ? ?No orders of the defined types were placed in this encounter. ? ? ? ?Disposition:   ? ? ? ?Mertie Moores, MD  ?06/04/2021 4:20 PM    ?[facility] ?[address], [place], [place]  [address] ?Phone: [phone]; Fax: [phone]  ? ?

## 2021-07-02 ENCOUNTER — Other Ambulatory Visit: Payer: Self-pay | Admitting: Cardiovascular Disease

## 2021-09-25 ENCOUNTER — Other Ambulatory Visit: Payer: Self-pay | Admitting: Cardiovascular Disease

## 2021-09-25 DIAGNOSIS — I4819 Other persistent atrial fibrillation: Secondary | ICD-10-CM

## 2021-09-25 DIAGNOSIS — I480 Paroxysmal atrial fibrillation: Secondary | ICD-10-CM

## 2021-09-25 NOTE — Telephone Encounter (Signed)
Eliquis 41m refill request received. Patient is 76years old, weight-78kg, Crea- 0.87 on 12/21/2020, Diagnosis-Afib, and last seen by Dr. NAcie Fredricksonon 06/04/2021. Dose is appropriate based on dosing criteria. Will send in refill to requested pharmacy.

## 2021-10-03 ENCOUNTER — Telehealth: Payer: Self-pay | Admitting: Physician Assistant

## 2021-10-03 MED ORDER — METOPROLOL SUCCINATE ER 100 MG PO TB24: ORAL_TABLET | ORAL | 2 refills | Status: DC

## 2021-10-03 NOTE — Telephone Encounter (Signed)
Patient called after hours to get refill on Toprol-XL.  Prescription sent to requested pharmacy at [facility].

## 2021-10-17 ENCOUNTER — Other Ambulatory Visit: Payer: Self-pay

## 2021-10-17 MED ORDER — DILTIAZEM HCL ER COATED BEADS 360 MG PO CP24: ORAL_CAPSULE | ORAL | 2 refills | Status: DC

## 2021-11-07 ENCOUNTER — Encounter: Payer: Self-pay | Admitting: Neurology

## 2021-11-07 ENCOUNTER — Ambulatory Visit: Payer: Medicare Other | Admitting: Neurology

## 2021-11-07 VITALS — BP 142/105 | HR 69 | Ht 62.0 in | Wt 166.4 lb

## 2021-11-07 DIAGNOSIS — G3184 Mild cognitive impairment, so stated: Secondary | ICD-10-CM | POA: Diagnosis not present

## 2021-11-07 DIAGNOSIS — I5032 Chronic diastolic (congestive) heart failure: Secondary | ICD-10-CM | POA: Diagnosis not present

## 2021-11-07 DIAGNOSIS — Z789 Other specified health status: Secondary | ICD-10-CM

## 2021-11-07 DIAGNOSIS — G4731 Primary central sleep apnea: Secondary | ICD-10-CM

## 2021-11-07 DIAGNOSIS — I4819 Other persistent atrial fibrillation: Secondary | ICD-10-CM | POA: Diagnosis not present

## 2021-11-07 MED ORDER — DONEPEZIL HCL 10 MG PO TABS: 10.0000 mg | ORAL_TABLET | Freq: Every day | ORAL | 6 refills | Status: DC

## 2021-11-07 NOTE — Progress Notes (Signed)
SLEEP MEDICINE CLINIC   Provider:  Larey Seat, M D  Primary Care Physician:    Referring Provider: Thayer Headings, MD     Interval history: 11-07-2021: Yong Channel is seen here today she is more alert and conversational than she was the last time, she also has tolerated Aricept and its generic form very well and feels that has helped her memory, she still struggles some with depression I think she is worried about her cognitive health she has lost some weight which is positive overall, she is going swimming and exercising on a regular basis now ; she is a "silver sneaker".  Her husband is cooking healthy meals and low salt, low sugar, no artificial sweetener.  She lost over 30 pounds.    I also added a concern about her blood pressure 181/125 mmHg today we will repeat this at the end of the visit.  Her BMI is now 30 kg per metered square.        Rv on 05-07-2021:  NATASSJA OLLIS is seen today in a RV, 76 years old, and we see her today for cognitive concerns. Last visit was  tearful one and she was so upset about her husband.  28/30 points by MMSE today, with a peculiar loss of one point for year and one point consecutively for the date.  We discussed displaying a large calendar, and to have regular exercise. She already joined silver sneakers, has more endurance. She dreams more, she hydrates better and she lost 6 pounds.  I recommended the MIND diet and we discussing to start Aricept.     This is a new consult  for [name] , following a 3 year hiatus, today is the 12-21-2020. The patient is seen here with her husband, who is concerned about her excessive daytime sleepiness.  Her husband is almost deaf, but reports he could feel her pauses in breathing, when she is on her v back. He also wanted her memory to be evaluated.  He is worried about her memory loss.  She is reportedly having difficulties with the TV control. She herself has no concern about her memory: She has  atrial fibrillation. In 2019 had a fall with brain hemarrhage.  She sleeps all the time- Epworth 22/ 24 points.  No data on her ASV.   Mr. Lotz  has made arrangements for a private trainer and buys her multivitamins.  She was admitted in April 2022 ; ED visit; depression/anxiety, chronic diastolic CHF, hyperlipidemia presented to [facility] P4/5 with right-sided epistaxis.  Multiple attempts to control bleeding failed including cautery and packing and transferred to [facility] seen by ENT Epistat was placed and patient also had A. fib with RVR and hypotensive needing Cardizem drip and ICU admission. Her hemoglobin remained stable overall although some drop.  Monitor in ICU, on 4/8 nasal packing was removed.  Patient appears fairly ill frail needing PT eval blood pressure soft so discharge was held and transferred to [facility] service. Patient has been lethargic sleepy sometimes confused some- BP has been soft, and labs showed AKI AKI was given iv boluses with improvement in her symptoms Monitored after starting eliquis on 4/11 and did well no nose bleeding and seen thsi am and is stable for discharge home, anxious to go home soon   Discharge Diagnoses:  Right-sided severe epistaxis in the setting of anticoagulation.  Epistal removed  By ENT 4/8. ENT advised to hold patient's Xarelto for couple of days-I have  discussed with her cardiologist Dr. Acie Fredrickson- recommended to switch to Eliquis for less bleeding risk -started 4/11-monitored overnight to make sure patient has no recurrence of nasal epistaxis.  Hemoglobin overall stable although mild acute blood loss anemia currently at 2.3 g.     Chronic A. fib with RVR: Rate currently controlled, patient has been hypotensive/soft BP-that improved with IV fluid bolus and after stopping losartan.  Cont on her Cardizem, metoprolol ( increased to 100 mg) .There is question whether patient was taking her medication appropriately given that she presented with hypertension and  uncontrolled RVR-since starting her meds blood pressure has been soft now.  Xarelto on hold 2/2 severe epistaxis-I have discussed with her cardiologist Dr. Acie Fredrickson- recommended switching to Eliquis.   Hypotension/soft blood pressure:s/p IV fluids and off losartan.  Stable, continue Cardizem, metoprolol for A. Fib and dose was increased to 100 mg. .   Lethargy/possible acute metabolic encephalopathy in the setting of soft blood pressure hypotension dehydration: This has resolved with improvement in blood pressure and with IV hydration.ABG showed no CO2 retention.  Appears anxious and forgetful.  Provide supportive care   YIF:OYDXAJO creatinine was elevated to 1.3 from 0.8-subsequently improved with IV fluids.  Monitor         RV 11-04-2017, patient had a bad fall 10-07-2017 , presented to ED. She was walking outdoors in [place] and she stumbled over a pump outlet, her shoe strings were caught, she fell. She bruised her left temple. She went to Dr. Ardeth Perfect who ordered an MRI which showed a small subdural. She was confused , and bradycardic. Her bleed was in the basal ganglia - and no mass effect was seen. She went to the ED and seen by  Dr. Madison Hickman and Dr. Erlinda Hong and was observed for 24 hours , d/c on the home medication.  Her left eye swell shut- she remains bradycardic and noted she is still feeling as if walking on egg-crates, dizziness. She had some diplopia . She has incontinence of urine, had it before now worse! She has been seen at [facility] urology before, but was not " retrained".  She is sleepier. 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     RV from 05-06-2017, for Mrs. Sampsel seen here with her husband.  KIRSTYN LEAN is a 76 y.o. female , who has been for the first time ever hospitalized in January of this year.  She had presented to her primary care physician who referred her by ambulance across the road to the hospital.   Until then she had used  her ASV compliantly, after 3 days in the hospital for a cardiac workup she returned home. The physician asked her orientation questions, and she apparently couldn't answer- was diagnosed with dementia ! Atrial fibrillation, CHF and she underwent cardioversion, remains anticoagulated. She is back in a fib. She developed urinary incontinence, nocturia has slowed down. She still struggles with the CPAP since her admission- unsure why- but I noticed she lost a lot of weight- 50 pounds and she may not need the CPAP or not at current settings. There is no evidence of dementia now, may be it was delirium? She could not tolerate the large CPAP mask- no longer fits.  She has returned to the Eson nasal Mask in medium.    ASV is set at 5 cm EPAP, minimum pressure support of  3 cm and maximum 15 cm water pressure. AHI is 18.6 on this  setting,  high air leaks. 05-04-2017. HST will be ordered to confirm to need for CPAP or not.      I have the pleasure of seeing Mrs. Alen today on 10/30/2016 in a follow-up visit after CPAP titration performed on 06/25/2016, the patient was titrated first to CPAP but then this was switched to a BiPAP setting which she tolerated better. She did very well with a ASV setting of 5 cm EPAP, a minimum pressure support of 3 cm and a maximum pressure support of 15 cm. She also had less and less arousals as the night went on not just due to respiratory factors but also due to an increased oxygen nadir. She was fitted with an Eson nasal mask. She tolerated ASV much better than BiPAP. I have now the pleasure of seeing the first compliance visit the patient has used the machine 83% for the last 30 days over 4 hours with an average user time of 6 hours and 7 minutes, ASV settings as quoted above her residual AHI is 4.6 of which 3.5 by hypopneas.      Original Consult: seen here as in a referral from Dr. Acie Fredrickson for a new sleep apnea evaluation.  Chief complaint according to patient : "I was diagnosed  with apnea but could not get CPAP to work right". Mrs. Nayeli Calvert husband is also a patient of my sleep practice, she was referred by her cardiologist, Dr. Cathie Olden, for sleep evaluation to the [place] sleep lab. She reports that atrial fibrillation was her main risk factor. In addition she had hypertension and a body mass index exceeding 30. Atrial fibrillation has been persistent, she is chronically anticoagulated on Eloquis. The patient reportedly has a cardiac valve regurgitation, diagnosed by echocardiogram but I do not have the original diagnostic tests available.The patient also has a history of hyperlipidemia, panic attacks,, irritable bowel syndrome, hypertension, urinary incontinence.   Sleep habits are as follows: The patient likes to read and she goes to bed but she falls asleep very promptly and feels that she can sleep uninterrupted through the night. She remembers vivid, beautiful dreams not nightmarish in character but colorful and very interesting.  h she shares the bedroom with her husband , who reports her to snore very, very loudly. He is hard of hearing and not normally bothered.  She usually retreats to her bedroom by about 10 PM starts to read and is asleep within 15-30 minutes. She prefers to sleep on her back and on her side. She sleeps on 2 thin, firm pillows.Her husband reports that she seems to be gasping for air and still snoring while using CPAP.  Sleep medical history and family sleep history: Mrs. Mcmurphy maternal grandmother died at age 75 of a sudden cardiac death, her mother collapsed and fell hitting her had never regaining consciousness died at age 30. Mrs. Stanek has undergone a tonsillectomy in 1963, she had frequent falls and broken both ankles, left wrist and left collarbone. Social history: married, retired, does not drink caffeine, lifelong non smoker, ETOH seldomly.   Her cholesterol and her last labs from April was 188 total, triglycerides 101, HDL was 44, creatinine  was 0.7 liver function tests only marginal elevation of ALT at 41 units per liter was noted. She is mildly leukopenic at 3.97K, but she does have no evidence of anemia. TSH was in normal limits. I reviewed the patient's previous to sleep studies she was admitted for a split night polysomnography on 01/11/2015. The patient had  frequent hypopneas and apneas the RDI was 56.7 which is very high. She did not have Cheyne-Stokes breathing of central apneas. She seemed to have slept all night in supine sleep - She was titrated to CPAP and establishing a high pressure of 19 cm water,  and her AHI did not respond it remained in the 2 digits. Her AHI was 65.5 on CPAP of 19 cm water she was changed to bilevel and remained with an AHI in the 60s under 23/19 cm water she was asked back for full night BiPAP titration on 04/11/2015 and her RDI exacerbated to 44.5;  this same procedure of a bilevel titration again to a pressure of 19/15 cm water resulted in a residual AHI of 26.7.  Oxygen nadir was 90% saturation.  Overall,  the patient barely slept and she continued to have difficulties using the machine at home. An optimal pressure was never identified.  Review of Systems:  Out of a complete 14 system review, the patient complains of only the following symptoms, and all other reviewed systems are negative.  2019 fall related traumatic bleed, not sub-dural but basal ganglia.  Right sided bleed is explaining her sleepiness-  but worried about incontinence.  Snoring. Hypersomnia, excessive sleepiness.   Epworth score self assessed at 6 points, she sleeps always in the car ( as a passenger), and when reading , and when watching TV - and naps every afternoon - 2 to 3 hours in PM.  more likely 12 points/ 12 with less frequent vivid dreams now on ASV reduced to 12/24, Fatigue severity score 11 points.   thalamic/ basal ganglia bleed- sleepier- diplopia.    No longer driving  EDS , depression , memory loss,    Epistaxis.    How likely are you to doze in the following situations: 0 = not likely, 1 = slight chance, 2 = moderate chance, 3 = high chance  Sitting and Reading? Watching Television? Sitting inactive in a public place (theater or meeting)? Lying down in the afternoon when circumstances permit? Sitting and talking to someone? Sitting quietly after lunch without alcohol? In a car, while stopped for a few minutes in traffic? As a passenger in a car for an hour without a break? No longer driving.   Total = 17/ 24  Sleeps from   Wakes up 9 AM ,  she gets to the [facility],  spends one hour- comes home and ( she also doesn't get up until lunch some days), gets up to eat and again sleeps afterwards with Tv,  but not just for one hour-   has dinner and stays awake from 6-11 Pm and goes back to bed-). She has a new dog- 45 years-old collie, and that has given her a sense of purpose.   GDS 8-15 points.    Social History   Socioeconomic History   Marital status: Married    Spouse name: Jeneen Rinks   Number of children: Not on file   Years of education: Not on file   Highest education level: Not on file  Occupational History   Not on file  Tobacco Use   Smoking status: Never   Smokeless tobacco: Never  Vaping Use   Vaping Use: Never used  Substance and Sexual Activity   Alcohol use: No   Drug use: No   Sexual activity: Not on file  Other Topics Concern   Not on file  Social History Narrative   Lives with husband   Right handed  Caffeine: none   Social Determinants of Radio broadcast assistant Strain: Not on file  Food Insecurity: Not on file  Transportation Needs: Not on file  Physical Activity: Not on file  Stress: Not on file  Social Connections: Not on file  Intimate Partner Violence: Not on file    Family History  Problem Relation Age of Onset   Heart failure Mother    Heart attack Father    Hypertension Maternal Grandmother    Heart attack Paternal Grandfather     Breast cancer Maternal Aunt     Past Medical History:  Diagnosis Date   Atrial fibrillation ([place]), ASV- Dr Acie Fredrickson.    Basal cell carcinoma 07/23/2018   right cheekbone-[id number]   IBS (irritable bowel syndrome)    Obesity (BMI 30-39.9) 07/11/2015   OSA ( sleep apnea) on ASV 01/17/2015   Severe with AHI 35.8/hr   Panic attacks    Urinary incontinence, nocturnal enuresis     Past Surgical History:  Procedure Laterality Date   CARDIOVERSION N/A 04/02/2017   Procedure: CARDIOVERSION;  Surgeon: Skeet Latch, MD;  Location: [place];  Service: Cardiovascular;  Laterality: N/A;   SKIN BIOPSY N/A 07/23/2018   mid upper back-moderate-0 tmt   TONSILLECTOMY      Current Outpatient Medications  Medication Sig Dispense Refill   ALPRAZolam (XANAX) 0.25 MG tablet Take 1 tablet (0.25 mg total) by mouth at bedtime as needed for anxiety (pre MRI). 6 tablet 0   budesonide (ENTOCORT EC) 3 MG 24 hr capsule Take 9 mg by mouth every morning.     Cholecalciferol 1.25 MG (50000 UT) TABS Take 50,000 Units by mouth every Tuesday.     diltiazem (CARDIZEM CD) 360 MG 24 hr capsule TAKE 1 CAPSULE BY MOUTH DAILY. 90 capsule 2   donepezil (ARICEPT) 5 MG tablet Take 1 tablet (5 mg total) by mouth at bedtime. 30 tablet 5   ELIQUIS 5 MG TABS tablet TAKE 1 TABLET(5 MG) BY MOUTH TWICE DAILY 180 tablet 1   metoprolol succinate (TOPROL-XL) 100 MG 24 hr tablet TAKE 1 TABLET(100 MG) BY MOUTH DAILY WITH OR IMMEDIATELY FOLLOWING A MEAL 90 tablet 2   sertraline (ZOLOFT) 100 MG tablet Take 200 mg by mouth at bedtime.     No current facility-administered medications for this visit.    Allergies as of 11/07/2021 - Review Complete 11/07/2021  Allergen Reaction Noted   Sulfa antibiotics Other (See Comments) 08/24/2014   Codeine  10/06/2019   Epinephrine Other (See Comments) 06/12/2016    Vitals: BP (!) 181/125 (BP Location: Left Arm, Patient Position: Sitting, Cuff Size: Normal)   Pulse 85   Ht 5' 2"  (1.575 m)    Wt 166 lb 6.4 oz (75.5 kg)   BMI 30.43 kg/m   Last Weight:  Wt Readings from Last 1 Encounters:  11/07/21 166 lb 6.4 oz (75.5 kg)   RUE:AVWU mass index is 30.43 kg/m.       Last Height:   Ht Readings from Last 1 Encounters:  11/07/21 5' 2"  (1.575 m)    Physical exam:  General: The patient is awake, alert and appears not in acute distress. The patient is well groomed. Head: Normocephalic, atraumatic. Neck is supple. Mallampati 2.   neck circumference:15. Nasal airflow congested, but patent, TMJ click is not evident. Retrognathia is not seen.   Cranial Nerves; Pupils equal in size and shape and reactive to light.  Cardiovascular: irregular rate and rhythm, without  murmurs or carotid bruit, and without  distended neck veins. Respiratory: Lungs are clear to auscultation. Skin:  Without evidence of edema, or rash Trunk: BMI is no 30, she lost 50 pounds.    The patient's posture is erect.  Neurologic exam : The patient is awake and alert, oriented to place and time.   She had been not oriented to the date, great ST recall.   She is not tearful, she is joking, more outgoing.     11/07/2021   10:30 AM 05/07/2021   10:47 AM  MMSE - Mini Mental State Exam  Orientation to time 3 3  Orientation to Place 5 5  Registration 3 3  Attention/ Calculation 5 5  Recall 3 3  Language- name 2 objects 2 2  Language- repeat 1 1  Language- follow 3 step command 3 3  Language- read & follow direction 1 1  Write a sentence 1 1  Copy design 1 1  Total score 28 28    Memory :    12/21/2020   10:33 AM  Montreal Cognitive Assessment   Visuospatial/ Executive (0/5) 3  Naming (0/3) 3  Attention: Read list of digits (0/2) 2  Attention: Read list of letters (0/1) 0  Attention: Serial 7 subtraction starting at 100 (0/3) 3  Language: Repeat phrase (0/2) 2  Language : Fluency (0/1) 0  Abstraction (0/2) 2  Delayed Recall (0/5) 3  Orientation (0/6) 3  Total 21   12-21-2020:   mini-mental status exam 30/ 30 points.  GDS: 7/ 15 , clinical depression.  Speech is fluent,  without dysarthria, dysphonia or aphasia.  Mood and affect are today very different, happy and relaxed.  Cranial nerves: Pupils are equal and briskly reactive to light. Extraocular movements  in vertical and horizontal planes intact and without nystagmus. Visual fields by finger perimetry are intact. Hearing intact  Facial sensation intact to fine touch. Facial motor strength is symmetric and tongue and uvula move midline.   Shoulder shrug was symmetrical.  Strength within normal limits. Stance is stable and normal.  Deep tendon reflexes: in the  upper and lower extremities are brisk, mostly on the left -intact. Babinski maneuver response is downgoing.   IMAGING :  STUDY DATE: 01/09/21 PATIENT NAME: YARIXA LIGHTCAP DOB: [birth year]  IMPRESSION:    Abnormal MRI brain with and without contrast demonstrating: -Mild atrophy and mild to moderate chronic small vessel ischemic disease.  Chronic lacunar infarcts within right basal ganglia, right thalamus and right middle cerebellar peduncle. Stable from 2021. -Numerous supratentorial and infratentorial chronic cerebral microhemorrhages.  This can be seen in a spectrum of chronic small vessel ischemic disease or amyloid angiopathy.  Stable from 2021. -Abnormal flow signal within left vertebral artery may represent proximal stenosis or occlusion.  This appears to be new finding compared to 2021. -No acute findings.     Assessment:  After physical and neurologic examination, review of laboratory studies,  Personal review of imaging studies, reports of other /same  Imaging studies, results of polysomnography and / or neurophysiology testing and pre-existing records as far as provided in visit., my assessment is   So in her medication list I found still listed Zoloft which she is not taking anymore.  I had at 1 time given the patient with Xanax as needed use  pre-MRI and sleep study this is a medication that she does not take on a regular basis so I will take it off her list today.  1) Mrs Kuntzman had been depressed, sleepy and afraid.  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 And today's MME 28/ 30 confirms my suspicion of pseudo-dementia.  She is much more alert- and she stopped ZOLOFT ! She tolerated aricept well , will continue.    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  No aspirin  3) MMSE from now on every 6 months.   I offered aricept at 5 mg a day, will increase(if tolerated)  next time.     In summary, I have not addressed her sleeping disorder today -  Mrs. Cephus has several risk factors and obviously has been diagnosed with severe sleep apnea but she could not respond to CPAP nor BiPAP.  She is now on ASV and initially much better- but after her cardiac decompensation and hopital admission lost 50 pounds - and now her mask is not fitting, she I has high air leaks, high residual AHI of 18 plus.  She has treatment emergent central sleep apnea.   She is unable to set ASV up by herself, and she needs this assistance at home .   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 I leave her on aricept because she feels it helps her and her husband feels this way too. I congratulate  her on her lifestyle changes, 50 pounds weight loss, and less pain more mobility. Better sleep.  She d/c ZOLOFT.     I spent more than 30 minutes of face to face time with the patient. Greater than 50% of time was spent in counseling and coordination of care. We have discussed the diagnosis and differential and I answered the patient's questions.     Larey Seat, MD 0/32/1224, 82:50 AM   Certified in Neurology by ABPN Certified in [place] by [facility] [address] [place], [place]  [id number]

## 2021-11-07 NOTE — Patient Instructions (Signed)
There are well-accepted and sensible ways to reduce risk for Alzheimers disease and other degenerative brain disorders .  Exercise Daily Walk A daily 20 minute walk should be part of your routine. Disease related apathy can be a significant roadblock to exercise and the only way to overcome this is to make it a daily routine and perhaps have a reward at the end (something your loved one loves to eat or drink perhaps) or a personal trainer coming to the home can also be very useful. Most importantly, the patient is much more likely to exercise if the caregiver / spouse does it with him/her. In general a structured, repetitive schedule is best.  General Health: Any diseases which effect your body will effect your brain such as a pneumonia, urinary infection, blood clot, heart attack or stroke. Keep contact with your primary care doctor for regular follow ups.  Sleep. A good nights sleep is healthy for the brain. Seven hours is recommended. If you have insomnia or poor sleep habits we can give you some instructions. If you have sleep apnea wear your mask.  Diet: Eating a heart healthy diet is also a good idea; fish and poultry instead of red meat, nuts (mostly non-peanuts), vegetables, fruits, olive oil or canola oil (instead of butter), minimal salt (use other spices to flavor foods), whole grain rice, bread, cereal and pasta and wine in moderation.Research is now showing that the MIND diet, which is a combination of The Mediterranean diet and the DASH diet, is beneficial for cognitive processing and longevity. Information about this diet can be found in The MIND Diet, a book by Doyne Keel, MS, RDN, and online at [employer].si  Finances, Power of Attorney and Advance Directives: You should consider putting legal safeguards in place with regard to financial and medical decision making. While the spouse always has power of attorney for medical and financial issues in the  absence of any form, you should consider what you want in case the spouse / caregiver is no longer around or capable of making decisions.   The Alzheimers Association Position on Disease Prevention  Can Alzheimer's be prevented? It's a question that continues to intrigue researchers and fuel new investigations. There are no clear-cut answers yet -- partially due to the need for more large-scale studies in diverse populations -- but promising research is under way. The Alzheimer's Association is leading the worldwide effort to find a treatment for Alzheimer's, delay its onset and prevent it from developing.   What causes Alzheimer's? Experts agree that in the vast majority of cases, Alzheimer's, like other common chronic conditions, probably develops as a result of complex interactions among multiple factors, including age, genetics, environment, lifestyle and coexisting medical conditions. Although some risk factors -- such as age or genes -- cannot be changed, other risk factors -- such as high blood pressure and lack of exercise -- usually can be changed to help reduce risk. Research in these areas may lead to new ways to detect those at highest risk.  Prevention studies A small percentage of people with Alzheimer's disease (less than 1 percent) have an early-onset type associated with genetic mutations. Individuals who have these genetic mutations are guaranteed to develop the disease. An ongoing clinical trial conducted by the [facility] ([facility]), is testing whether antibodies to beta-amyloid can reduce the accumulation of beta-amyloid plaque in the brains of people with such genetic mutations and thereby reduce, delay or prevent symptoms. Participants in the trial are receiving antibodies (  or placebo) before they develop symptoms, and the development of beta-amyloid plaques is being monitored by brain scans and other tests.  Another clinical trial, known as the A4 trial  (Anti-Amyloid Treatment in Asymptomatic Alzheimer's), is testing whether antibodies to beta-amyloid can reduce the risk of Alzheimer's disease in older people (ages 4 to 25) at high risk for the disease. The A4 trial is being conducted by the Alzheimer's Disease Cooperative Study.  Though research is still evolving, evidence is strong that people can reduce their risk by making key lifestyle changes, including participating in regular activity and maintaining good heart health. Based on this research, the Alzheimer's Association offers 10 Ways to Love Your Brain -- a collection of tips that can reduce the risk of cognitive decline.  Heart-head connection  New research shows there are things we can do to reduce the risk of mild cognitive impairment and dementia.  Several conditions known to increase the risk of cardiovascular disease -- such as high blood pressure, diabetes and high cholesterol -- also increase the risk of developing Alzheimer's. Some autopsy studies show that as many as 23 percent of individuals with Alzheimer's disease also have cardiovascular disease.  A longstanding question is why some people develop hallmark Alzheimer's plaques and tangles but do not develop the symptoms of Alzheimer's. Vascular disease may help researchers eventually find an answer. Some autopsy studies suggest that plaques and tangles may be present in the brain without causing symptoms of cognitive decline unless the brain also shows evidence of vascular disease. More research is needed to better understand the link between vascular health and Alzheimer's.  Physical exercise and diet Regular physical exercise may be a beneficial strategy to lower the risk of Alzheimer's and vascular dementia. Exercise may directly benefit brain cells by increasing blood and oxygen flow in the brain. Because of its known cardiovascular benefits, a medically approved exercise program is a valuable part of any overall wellness  plan.  Current evidence suggests that heart-healthy eating may also help protect the brain. Heart-healthy eating includes limiting the intake of sugar and saturated fats and making sure to eat plenty of fruits, vegetables, and whole grains. No one diet is best. Two diets that have been studied and may be beneficial are the DASH (Dietary Approaches to Stop Hypertension) diet and the Mediterranean diet. The DASH diet emphasizes vegetables, fruits and fat-free or low-fat dairy products; includes whole grains, fish, poultry, beans, seeds, nuts and vegetable oils; and limits sodium, sweets, sugary beverages and red meats. A Mediterranean diet includes relatively little red meat and emphasizes whole grains, fruits and vegetables, fish and shellfish, and nuts, olive oil and other healthy fats.  Social connections and intellectual activity A number of studies indicate that maintaining strong social connections and keeping mentally active as we age might lower the risk of cognitive decline and Alzheimer's. Experts are not certain about the reason for this association. It may be due to direct mechanisms through which social and mental stimulation strengthen connections between nerve cells in the brain.  Head trauma There appears to be a strong link between future risk of Alzheimer's and serious head trauma, especially when injury involves loss of consciousness. You can help reduce your risk of Alzheimer's by protecting your head.  Wear a seat belt  Use a helmet when participating in sports  "Fall-proof" your home   What you can do now While research is not yet conclusive, certain lifestyle choices, such as physical activity and diet, may help support brain  health and prevent Alzheimer's. Many of these lifestyle changes have been shown to lower the risk of other diseases, like heart disease and diabetes, which have been linked to Alzheimer's. With few drawbacks and plenty of known benefits, healthy lifestyle  choices can improve your health and possibly protect your brain.  Learn more about brain health. You can help increase our knowledge by considering participation in a clinical study. Our free clinical trial matching services, TrialMatch, can help you find clinical trials in your area that are seeking volunteers.  Understanding prevention research Here are some things to keep in mind about the research underlying much of our current knowledge about possible prevention:  Insights about potentially modifiable risk factors apply to large population groups, not to individuals. Studies can show that factor X is associated with outcome Y, but cannot guarantee that any specific person will have that outcome. As a result, you can "do everything right" and still have a serious health problem or "do everything wrong" and live to be 100.  Much of our current evidence comes from large epidemiological studies such as the [place] Aging Study, the Nurses' Health Study, the Adult Changes in Thought Study and the [employer] These studies explore pre-existing behaviors and use statistical methods to relate those behaviors to health outcomes. This type of study can show an "association" between a factor and an outcome but cannot "prove" cause and effect. This is why we describe evidence based on these studies with such language as "suggests," "may show," "might protect," and "is associated with."  The [place]ender standard for showing cause and effect is a clinical trial in which participants are randomly assigned to a prevention or risk management strategy or a control group. Researchers follow the two groups over time to see if their outcomes differ significantly.  It is unlikely that some prevention or risk management strategies will ever be tested in randomized trials for ethical or practical reasons. One example is exercise. Definitively testing the impact of exercise on Alzheimer's risk would require a huge  trial enrolling thousands of people and following them for many years. The expense and logistics of such a trial would be prohibitive, and it would require some people to go without exercise, a known health benefit.

## 2021-12-20 ENCOUNTER — Telehealth: Payer: Self-pay | Admitting: Cardiovascular Disease

## 2021-12-20 NOTE — Telephone Encounter (Signed)
Called pt advised of MD recommendation to follow up with PCP.  Pt had no further concerns.

## 2021-12-20 NOTE — Telephone Encounter (Signed)
Left message to call back  

## 2021-12-20 NOTE — Telephone Encounter (Signed)
Pt returning call

## 2021-12-20 NOTE — Telephone Encounter (Signed)
Pt is having constipation and frequent urination. She states she has been feeling the best she has felt in 20 years, but have been having some issues that she isn't sure if she should be scheduling with cardiologist or PCP. Requesting call back to discuss this and issues she is having.

## 2022-05-21 ENCOUNTER — Telehealth: Payer: Self-pay | Admitting: *Deleted

## 2022-05-21 NOTE — Progress Notes (Unsigned)
PATIENT: Beth Haynes DOB: [birth year]  REASON FOR VISIT: follow up HISTORY FROM: patient  No chief complaint on file.    HISTORY OF PRESENT ILLNESS:  05/21/22 ALL: Beth SmilesMary H Prest is a 77 y.o. female here today for follow up for OSA on ASV.      HISTORY: (copied from Dr Dohmeier's previous notes)  Interval history: 11-07-2021: Guinevere ScarletMary F. Caylor is seen here today she is more alert and conversational than she was the last time, she also has tolerated Aricept and its generic form very well and feels that has helped her memory, she still struggles some with depression I think she is worried about her cognitive health she has lost some weight which is positive overall, she is going swimming and exercising on a regular basis now ; she is a "silver sneaker".  Her husband is cooking healthy meals and low salt, low sugar, no artificial sweetener.  She lost over 30 pounds.     I also added a concern about her blood pressure 181/125 mmHg today we will repeat this at the end of the visit.  Her BMI is now 30 kg per metered square.     REVIEW OF SYSTEMS: Out of a complete 14 system review of symptoms, the patient complains only of the following symptoms, and all other reviewed systems are negative.  ESS:  ALLERGIES: Allergies  Allergen Reactions   Sulfa Antibiotics Other (See Comments)    Childhood allergy    Codeine     Other reaction(s): Unknown   Epinephrine Other (See Comments)    Panic attacks     HOME MEDICATIONS: Outpatient Medications Prior to Visit  Medication Sig Dispense Refill   ALPRAZolam (XANAX) 0.25 MG tablet Take 1 tablet (0.25 mg total) by mouth at bedtime as needed for anxiety (pre MRI). 6 tablet 0   budesonide (ENTOCORT EC) 3 MG 24 hr capsule Take 9 mg by mouth every morning.     Cholecalciferol 1.25 MG (50000 UT) TABS Take 50,000 Units by mouth every Tuesday.     diltiazem (CARDIZEM CD) 360 MG 24 hr capsule TAKE 1 CAPSULE BY MOUTH DAILY. 90 capsule 2   donepezil  (ARICEPT) 10 MG tablet Take 1 tablet (10 mg total) by mouth at bedtime. 30 tablet 6   ELIQUIS 5 MG TABS tablet TAKE 1 TABLET(5 MG) BY MOUTH TWICE DAILY 180 tablet 1   metoprolol succinate (TOPROL-XL) 100 MG 24 hr tablet TAKE 1 TABLET(100 MG) BY MOUTH DAILY WITH OR IMMEDIATELY FOLLOWING A MEAL 90 tablet 2   No facility-administered medications prior to visit.    PAST MEDICAL HISTORY: Past Medical History:  Diagnosis Date   Atrial fibrillation (HCC)    Basal cell carcinoma 07/23/2018   right cheekbone-[id number]   IBS (irritable bowel syndrome)    Obesity (BMI 30-39.9) 07/11/2015   OSA (obstructive sleep apnea) 01/17/2015   Severe with AHI 35.8/hr   Panic attacks    Urinary incontinence, nocturnal enuresis     PAST SURGICAL HISTORY: Past Surgical History:  Procedure Laterality Date   CARDIOVERSION N/A 04/02/2017   Procedure: CARDIOVERSION;  Surgeon: Chilton Siandolph, Tiffany, MD;  [facility];  Service: Cardiovascular;  Laterality: N/A;   SKIN BIOPSY N/A 07/23/2018   mid upper back-moderate-0 tmt   TONSILLECTOMY      FAMILY HISTORY: Family History  Problem Relation Age of Onset   Heart failure Mother    Heart attack Father    Hypertension Maternal Grandmother    Heart attack Paternal  Grandfather    Breast cancer Maternal Aunt     SOCIAL HISTORY: Social History   Socioeconomic History   Marital status: Married    Spouse name: Fayrene Fearing   Number of children: Not on file   Years of education: Not on file   Highest education level: Not on file  Occupational History   Not on file  Tobacco Use   Smoking status: Never   Smokeless tobacco: Never  Vaping Use   Vaping Use: Never used  Substance and Sexual Activity   Alcohol use: No   Drug use: No   Sexual activity: Not on file  Other Topics Concern   Not on file  Social History Narrative   Lives with husband   Right handed   Caffeine: none   Social Determinants of Health   Financial Resource Strain: Not on file   Food Insecurity: Not on file  Transportation Needs: Not on file  Physical Activity: Not on file  Stress: Not on file  Social Connections: Not on file  Intimate Partner Violence: Not on file     PHYSICAL EXAM  There were no vitals filed for this visit. There is no height or weight on file to calculate BMI.  Generalized: Well developed, in no acute distress  Cardiology: normal rate and rhythm, no murmur noted Respiratory: clear to auscultation bilaterally  Neurological examination  Mentation: Alert oriented to time, place, history taking. Follows all commands speech and language fluent Cranial nerve II-XII: Pupils were equal round reactive to light. Extraocular movements were full, visual field were full  Motor: The motor testing reveals 5 over 5 strength of all 4 extremities. Good symmetric motor tone is noted throughout.  Gait and station: Gait is normal.    DIAGNOSTIC DATA (LABS, IMAGING, TESTING) - I reviewed patient records, labs, notes, testing and imaging myself where available.     11/07/2021   10:30 AM 05/07/2021   10:47 AM  MMSE - Mini Mental State Exam  Orientation to time 3 3  Orientation to Place 5 5  Registration 3 3  Attention/ Calculation 5 5  Recall 3 3  Language- name 2 objects 2 2  Language- repeat 1 1  Language- follow 3 step command 3 3  Language- read & follow direction 1 1  Write a sentence 1 1  Copy design 1 1  Total score 28 28     Lab Results  Component Value Date   WBC 4.5 05/22/2020   HGB 12.3 05/22/2020   HCT 37.0 05/22/2020   MCV 92.3 05/22/2020   PLT 215 05/22/2020      Component Value Date/Time   NA 142 12/21/2020 1138   K 3.9 12/21/2020 1138   CL 104 12/21/2020 1138   CO2 27 12/21/2020 1138   GLUCOSE 91 12/21/2020 1138   GLUCOSE 115 (H) 05/22/2020 0411   BUN 12 12/21/2020 1138   CREATININE 0.87 12/21/2020 1138   CREATININE 0.64 03/13/2015 1141   CALCIUM 10.3 12/21/2020 1138   PROT 6.1 12/21/2020 1138   ALBUMIN 4.2  12/21/2020 1138   AST 12 12/21/2020 1138   ALT 16 12/21/2020 1138   ALKPHOS 82 12/21/2020 1138   BILITOT 0.7 12/21/2020 1138   GFRNONAA >60 05/22/2020 0411   GFRAA >60 10/24/2017 1138   Lab Results  Component Value Date   CHOL 127 02/21/2017   HDL 37 (L) 02/21/2017   LDLCALC 77 02/21/2017   TRIG 66 02/21/2017   CHOLHDL 3.4 02/21/2017   Lab Results  Component Value Date   HGBA1C 5.2 02/21/2017   Lab Results  Component Value Date   VITAMINB12 325 02/21/2017   Lab Results  Component Value Date   TSH 2.112 02/21/2017     ASSESSMENT AND PLAN 77 y.o. year old female  has a past medical history of Atrial fibrillation (HCC), Basal cell carcinoma (07/23/2018), IBS (irritable bowel syndrome), Obesity (BMI 30-39.9) (07/11/2015), OSA (obstructive sleep apnea) (01/17/2015), Panic attacks, and Urinary incontinence, nocturnal enuresis. here with     ICD-10-CM   1. 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 Compliance report reveals ***. *** was encouraged to continue using ASV nightly and for greater than 4 hours each night. We will update supply orders as indicated. Risks of untreated sleep apnea review and education materials provided. Healthy lifestyle habits encouraged. *** will follow up in ***, sooner if needed. *** verbalizes understanding and agreement with this plan.    No orders of the defined types were placed in this encounter.    No orders of the defined types were placed in this encounter.       Shawnie Dapper, FNP-C 05/21/2022, 12:53 PM [facility] [address], [address] [place], [place] [address] [phone]

## 2022-05-21 NOTE — Telephone Encounter (Signed)
Pt called pt and left message on voicemail to bring cpap machine to visit tomorrow along with power cord so we can download her data from machine.

## 2022-05-21 NOTE — Patient Instructions (Incomplete)
   Below is our plan:  We will continue donepezil 10mg  daily. I will place a referral to psychiatry and psychology for concerns of depression. Please continue regular exercise and happiness coaching sessions.   Please consider restarting ASV therapy when you are ready. Getting used to ASV and staying with the treatment long term does take time and patience and discipline. Untreated obstructive sleep apnea when it is moderate to severe can have an adverse impact on cardiovascular health and raise her risk for heart disease, arrhythmias, hypertension, congestive heart failure, stroke and diabetes. Untreated obstructive sleep apnea causes sleep disruption, nonrestorative sleep, and sleep deprivation. This can have an impact on your day to day functioning and cause daytime sleepiness and impairment of cognitive function, memory loss, mood disturbance, and problems focussing. Using ASV regularly can improve these symptoms.  Please make sure you are staying well hydrated. I recommend 50-60 ounces daily. Well balanced diet and regular exercise encouraged. Consistent sleep schedule with 6-8 hours recommended.   Please continue follow up with care team as directed.   Follow up with Dr Vickey Huger in 6 months. I anticipate we will alternate your visits between Dr Vickey Huger and myself. It was a pleasure meeting you.   You may receive a survey regarding today's visit. I encourage you to leave honest feed back as I do use this information to improve patient care. Thank you for seeing me today!

## 2022-05-22 ENCOUNTER — Encounter: Payer: Self-pay | Admitting: Family Medicine

## 2022-05-22 ENCOUNTER — Ambulatory Visit: Payer: Medicare Other | Admitting: Family Medicine

## 2022-05-22 VITALS — BP 171/87 | HR 87 | Ht 63.0 in | Wt 164.0 lb

## 2022-05-22 DIAGNOSIS — Z9989 Dependence on other enabling machines and devices: Secondary | ICD-10-CM | POA: Diagnosis not present

## 2022-05-22 DIAGNOSIS — F32A Depression, unspecified: Secondary | ICD-10-CM | POA: Diagnosis not present

## 2022-05-22 DIAGNOSIS — G4731 Primary central sleep apnea: Secondary | ICD-10-CM

## 2022-05-23 ENCOUNTER — Telehealth: Payer: Self-pay | Admitting: Family Medicine

## 2022-05-23 NOTE — Telephone Encounter (Signed)
Psychiatry referral sent to [facility] (fax# [phone], phone# [phone])

## 2022-07-06 ENCOUNTER — Other Ambulatory Visit: Payer: Self-pay | Admitting: Cardiovascular Disease

## 2022-07-10 ENCOUNTER — Telehealth: Payer: Self-pay | Admitting: Cardiovascular Disease

## 2022-07-10 MED ORDER — METOPROLOL SUCCINATE ER 100 MG PO TB24: ORAL_TABLET | ORAL | 0 refills | Status: DC

## 2022-07-10 NOTE — Telephone Encounter (Signed)
Returned call to patient and left detailed message per DPR advising that Toprol XL 100mg  was on her med list at her last office appt with Dr Elease Hashimoto on 06/04/21 and there was no intentions to change her medication regimen. Pt has appt scheduled in July. Will send in refill per request to carry until this appt.

## 2022-07-10 NOTE — Telephone Encounter (Signed)
Pt c/o medication issue:  1. Name of Medication:   metoprolol succinate (TOPROL-XL) 100 MG 24 hr tablet    2. How are you currently taking this medication (dosage and times per day)?   TAKE 1 TABLET(100 MG) BY MOUTH DAILY WITH OR IMMEDIATELY FOLLOWING A MEAL    3. Are you having a reaction (difficulty breathing--STAT)? No  4. What is your medication issue? Pt's spouse stated she was prescribed this medication by a MD while being in the hospital but he wants to know if its okay for her to continue to take it. If the MD says it's ok, he'd like for a refill to be sent over to pharmacy listed. Please advise

## 2022-08-03 ENCOUNTER — Other Ambulatory Visit: Payer: Self-pay | Admitting: Neurology

## 2022-08-12 ENCOUNTER — Encounter: Payer: Self-pay | Admitting: Cardiovascular Disease

## 2022-08-12 NOTE — Progress Notes (Unsigned)
Cardiology Office Note   Date:  08/13/2022   ID:  Beth Haynes, Beth Haynes 15-Dec-1945, MRN [id number]  PCP:  Beth Cleverly, PA  Cardiologist:   Kristeen Miss, MD   Chief Complaint  Patient presents with   Congestive Heart Failure        Hypertension        Atrial Fibrillation        Problem List 1.  Persistent atrial fib 2. Obstructive sleep apnea:   Having difficulty in getting a CPAP mask to fit.      Beth Haynes is a 77 y.o. female who presents for evaluation of possible atrial fib. She woke up with dizziness, room was spinning  Was seen by PA. Presented to the ER .  Head MRI was normal.  Symptoms sound like vertigo   She still has some occasional episodes of dizziness.   Has occasional episodes of palpitations ,  She thinks it may be related to panic attacks. No CP or dyspnea. Does not get any regular exercise.   [place]   Dec. 6, 2016:  Beth Haynes is seen today for follow up of her atrial fib.  BP is elevated today .   Ate some salty foods this weekend .   July 20, 2015:  Beth Haynes is seen back today .  Has PAF and OSA. She does not have a good CPAP mask that fits.   Dec. 13, 2017:  Was not able to find a CPAP that worked .   Could never get used to it  BP is elevated.  Has been eating more salt than usual - she and her husband eat popcorn every night.   February 26, 2017: Was hospitalized with CHF,  And Afib .  last week. Was not taking her meds and developed CHF  Is on Eliquis but is changing to Xarelto 20 mg a day   Mar 05, 2017:  Beth Haynes is doing better.  Still in AF,  HR is better controlled.  Was not taking the eliquis as directed initialy but has   July 23, 2017:  Beth Haynes is seen back today for history of paroxysmal atrial fibrillation.  She was seen by Tereso Newcomer in March and was set up for cardioversion.  She was successfully cardioverted at that time but when she showed up for follow-up visit she was already back in  atrial fibrillation.  We have adopted a rate control and anticoagulation strategy at that time.  She continues on  Xarelto.  Getting some [facility] ,  [facility]  [facility] 2019:  We discussed Beth Haynes,  [address]  They go up every weekend .   Beth Haynes is seen today for follow-up of her atrial fibrillation.  She previously had paroxysmal atrial fibrillation but now it appears that she has persistent atrial fibrillation.  She has had cardioversions in the past but has gone back into atrial fibrillation several times.  At this point we have developed a rate control and anticoagulation strategy.  Has been exercising but has not lost weight. clothers are looser  Wt. Is 197 ( up 17 lbs )  Eats ice cream every night .   Nov. 29, 2021: Beth Haynes is seen for her persistent atrial fib. Wt. Is   190 lbs  ( down 7 lbs from last year)  We discussed a new beer at [facility] .  Is walking daily .  Jun 16, 2020: Beth Haynes is seen for follow up visit She was admitted recently with epistazis.   Was on Xarelto Nose was packed by Dr. Willeen Niece xarelto for several days  Advised restarting eliquis after her nose healed.  Is back in atrial fib   Is working out at the [employer] says she is sleeping constantly  I advised him to brink this up with his primary MD  She is unsteady  On her feet.   I advised a cane or walker   June 04, 2021  Beth Haynes is seen for follow up of her CHF  They go up to [place] frequently - their place is 1 mile from [facility].  Is exercising  Advised her to avoid salt .  She has a new Loss adjuster, chartered now .   August 13, 2022  Seen with husband , [name] is seen for follow up of her CHF, HTN , atrial fib   Has a place in [place] ( 1 mile from [place] )      Past Medical History:  Diagnosis Date   Atrial fibrillation [facility])    Basal cell carcinoma 07/23/2018   right cheekbone-CX35FU   IBS (irritable bowel syndrome)    Obesity (BMI  30-39.9) 07/11/2015   OSA (obstructive sleep apnea) 01/17/2015   Severe with AHI 35.8/hr   Panic attacks    Urinary incontinence, nocturnal enuresis     Past Surgical History:  Procedure Laterality Date   CARDIOVERSION N/A 04/02/2017   Procedure: CARDIOVERSION;  Surgeon: Chilton Si, MD;  Location: [facility] ENDOSCOPY;  Service: Cardiovascular;  Laterality: N/A;   SKIN BIOPSY N/A 07/23/2018   mid upper back-moderate-0 tmt   TONSILLECTOMY       Current Outpatient Medications  Medication Sig Dispense Refill   budesonide (ENTOCORT EC) 3 MG 24 hr capsule Take 9 mg by mouth every morning.     Cholecalciferol 1.25 MG (50000 UT) TABS Take 50,000 Units by mouth every Tuesday.     diltiazem (CARDIZEM CD) 360 MG 24 hr capsule TAKE 1 CAPSULE BY MOUTH EVERY DAY 90 capsule 0   donepezil (ARICEPT) 10 MG tablet TAKE 1 TABLET BY MOUTH EVERYDAY AT BEDTIME 30 tablet 6   ELIQUIS 5 MG TABS tablet TAKE 1 TABLET(5 MG) BY MOUTH TWICE DAILY 180 tablet 1   metoprolol succinate (TOPROL-XL) 100 MG 24 hr tablet Take with or immediately following a meal. 90 tablet 0   No current facility-administered medications for this visit.    Allergies:   Sulfa antibiotics, Codeine, and Epinephrine    Social History:  The patient  reports that she has never smoked. She has never used smokeless tobacco. She reports that she does not drink alcohol and does not use drugs.   Family History:  The patient's family history includes Breast cancer in her maternal aunt; Heart attack in her father and paternal grandfather; Heart failure in her mother; Hypertension in her maternal grandmother.    ROS: Noted in current history, otherwise negative.  Physical Exam: Blood pressure 102/60, pulse (!) 58, height 5\' 3"  (1.6 m), weight 162 lb 9.6 oz (73.8 kg), SpO2 96 %.       GEN:  Well nourished, well developed in no acute distress HEENT: Normal NECK: No JVD; No carotid bruits LYMPHATICS: No lymphadenopathy CARDIAC: Irreg. Irreg.   RESPIRATORY:  Clear to auscultation without rales, wheezing or rhonchi  ABDOMEN: Soft, non-tender, non-distended MUSCULOSKELETAL:  No edema; No deformity  SKIN: Warm and dry NEUROLOGIC:  Alert and oriented x 3    EKG Interpretation Date/Time:  Tuesday August 13 2022 13:28:35 EDT Ventricular Rate:  58 PR Interval:    QRS Duration:  86 QT Interval:  428 QTC Calculation: 420 R Axis:   71  Text Interpretation: Atrial fibrillation with slow ventricular response ST & T wave abnormality, consider inferior ischemia When compared with ECG of 16-May-2020 15:24, Vent. rate has decreased BY  83 BPM Questionable change in QRS axis T wave inversion now evident in Inferior leads Confirmed by Kristeen Miss ([id number]) on 08/13/2022 1:37:23 PM      Recent Labs: No results found for requested labs within last 365 days.    Lipid Panel    Component Value Date/Time   CHOL 127 02/21/2017 0836   TRIG 66 02/21/2017 0836   HDL 37 (L) 02/21/2017 0836   CHOLHDL 3.4 02/21/2017 0836   VLDL 13 02/21/2017 0836   LDLCALC 77 02/21/2017 0836      Wt Readings from Last 3 Encounters:  08/13/22 162 lb 9.6 oz (73.8 kg)  05/22/22 164 lb (74.4 kg)  12/18/20 169 lb (76.7 kg)      Other studies Reviewed: Additional studies/ records that were reviewed today include: . Review of the above records demonstrates:    ASSESSMENT AND PLAN:  1.  Vertigo:      walks with a cane ,    2.    atrial fib:     stable ,  cont eliquis    3. Obstructive sleep apnea:         4. Essential HTN:     -  BP Is on the low side .  Will reduce the dilttiazem to 180 mg a day   5. Obesity:   encouraged better diet, more exercise      The following changes have been made:  no change  Labs/ tests ordered today include:   Orders Placed This Encounter  Procedures   EKG 12-Lead     Disposition:      Kristeen Miss, MD  08/13/2022 1:30 PM    [facility] [address] [place], [place], [place]   [address] Phone: [phone]; Fax: [phone]

## 2022-08-13 ENCOUNTER — Ambulatory Visit: Payer: Medicare Other | Attending: Cardiovascular Disease | Admitting: Cardiovascular Disease

## 2022-08-13 ENCOUNTER — Encounter: Payer: Self-pay | Admitting: Cardiovascular Disease

## 2022-08-13 VITALS — BP 102/60 | HR 58 | Ht 63.0 in | Wt 162.6 lb

## 2022-08-13 DIAGNOSIS — I10 Essential (primary) hypertension: Secondary | ICD-10-CM | POA: Diagnosis not present

## 2022-08-13 MED ORDER — DILTIAZEM HCL ER COATED BEADS 180 MG PO CP24: 180.0000 mg | ORAL_CAPSULE | Freq: Every day | ORAL | 3 refills | Status: AC

## 2022-08-13 NOTE — Patient Instructions (Signed)
Medication Instructions:  DECREASE Diltiazem CD to 180 mg once daily  *If you need a refill on your cardiac medications before your next appointment, please call your pharmacy*   Follow-Up: At [facility], you and your health needs are our priority.  As part of our continuing mission to provide you with exceptional heart care, we have created designated Provider Care Teams.  These Care Teams include your primary Cardiologist (physician) and Advanced Practice Providers (APPs -  Physician Assistants and Nurse Practitioners) who all work together to provide you with the care you need, when you need it.  We recommend signing up for the patient portal called "MyChart".  Sign up information is provided on this After Visit Summary.  MyChart is used to connect with patients for Virtual Visits (Telemedicine).  Patients are able to view lab/test results, encounter notes, upcoming appointments, etc.  Non-urgent messages can be sent to your provider as well.   To learn more about what you can do with MyChart, go to [email or web address].    Your next appointment:   6 month(s)  Provider:   Tereso Newcomer, PA

## 2022-09-25 ENCOUNTER — Other Ambulatory Visit: Payer: Self-pay | Admitting: Cardiovascular Disease

## 2022-09-25 DIAGNOSIS — I480 Paroxysmal atrial fibrillation: Secondary | ICD-10-CM

## 2022-09-25 NOTE — Telephone Encounter (Signed)
Prescription refill request for Eliquis received. Indication: AF Last office visit: 08/13/22  P Nahser MD Scr: 0.85 on 03/21/22 Age: 77 Weight: 73.8kg  Based on above findings Eliquis 5mg  twice daily is the appropriate dose.  Refill approved.

## 2022-10-04 ENCOUNTER — Other Ambulatory Visit: Payer: Self-pay | Admitting: Cardiovascular Disease

## 2022-11-21 NOTE — Therapy (Signed)
OUTPATIENT PHYSICAL THERAPY LOWER EXTREMITY EVALUATION   Patient Name: Beth Haynes MRN: [id number] DOB:[birth year], 77 y.o., female Today's Date: 11/25/2022  END OF SESSION:  PT End of Session - 11/25/22 1143     Visit Number 1    Date for PT Re-Evaluation 02/03/23    PT Start Time 1144    PT Stop Time 1220    PT Time Calculation (min) 36 min             Past Medical History:  Diagnosis Date   Atrial fibrillation (HCC)    Basal cell carcinoma 07/23/2018   right cheekbone-CX35FU   IBS (irritable bowel syndrome)    Obesity (BMI 30-39.9) 07/11/2015   OSA (obstructive sleep apnea) 01/17/2015   Severe with AHI 35.8/hr   Panic attacks    Urinary incontinence, nocturnal enuresis    Past Surgical History:  Procedure Laterality Date   CARDIOVERSION N/A 04/02/2017   Procedure: CARDIOVERSION;  Surgeon: Chilton Si, MD;  Location: [facility] ENDOSCOPY;  Service: Cardiovascular;  Laterality: N/A;   SKIN BIOPSY N/A 07/23/2018   mid upper back-moderate-0 tmt   TONSILLECTOMY     Patient Active Problem List   Diagnosis Date Noted   MCI (mild cognitive impairment) 12/21/2020   Difficulty with adaptive servo-ventilation (ASV) use 12/21/2020   Excessive daytime sleepiness 12/21/2020   Epistaxis 05/16/2020   Right-sided epistaxis    Anxiety 04/26/2019   Hyperlipidemia 04/26/2019   Intracerebral hemorrhage 10/24/2017   Paroxysmal atrial fibrillation (HCC) 05/06/2017   Intolerance of continuous positive airway pressure (CPAP) ventilation 05/06/2017   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   Persistent atrial fibrillation (HCC) 02/22/2017   Depression 02/20/2017   Chronic diastolic CHF (congestive heart failure) (HCC) 02/20/2017   Obstructive sleep apnea hypopnea, severe 06/12/2016   Obesity (BMI 30-39.9) 07/11/2015   OSA (obstructive sleep apnea) 01/17/2015   HTN (hypertension) 01/17/2015    PCP: Shellia Cleverly, PA  REFERRING PROVIDER: Shellia Cleverly, PA  REFERRING DIAG: falls,  unsteady gait  THERAPY DIAG:  Repeated falls  Muscle weakness (generalized)  Unsteadiness on feet  Difficulty in walking, not elsewhere classified  Rationale for Evaluation and Treatment: Rehabilitation  ONSET DATE: 11/05/22  SUBJECTIVE:   SUBJECTIVE STATEMENT: Patient reports that she attends exercise classes 2 x/week, but has difficulty keeping up. She is moving more slowly and has had several Falls.  PERTINENT HISTORY: Per referring provider order: Husband would like PT assessment due to falls and unsteadiness. PAIN:  Are you having pain? Patient reports some general achiness, worse in the morning.  PRECAUTIONS: Fall  RED FLAGS: None   WEIGHT BEARING RESTRICTIONS: No  FALLS:  Has patient fallen in last 6 months? Yes. Number of falls 3, seem random, no patterns.  LIVING ENVIRONMENT: Lives with: lives with their family Lives in: House/apartment Stairs: Yes: Internal: 14 steps; can reach both and External: 6 steps; on left going up Has following equipment at home: Single point cane  OCCUPATION: N/A- goes to exercise class twice/week.  PLOF: Independent, has shower, difficulty getting in and out.  PATIENT GOALS: Prevent additional falls, increase speed of movement.  NEXT MD VISIT: unknown  OBJECTIVE:  Note: Objective measures were completed at Evaluation unless otherwise noted.  DIAGNOSTIC FINDINGS: N/A  COGNITION: Overall cognitive status: Within functional limits for tasks assessed     SENSATION: WFL  EDEMA:  Patient denies edema  MUSCLE LENGTH: Hamstrings: Right 60 deg; Left 60 deg  POSTURE: rounded shoulders, forward head, increased thoracic kyphosis, and flexed trunk  PALPATION: No TTP  LOWER EXTREMITY ROM: BLE ROM WNL   LOWER EXTREMITY MMT: B LE (3+)-4-/5 throughout, Trunk unstable and weak   FUNCTIONAL TESTS:  5 times sit to stand: 20.72 used arm support and leaned back against mat to rise. Timed up and go (TUG): 19.06 Functional  gait assessment: N/A  GAIT: Distance walked: In clinic distances Assistive device utilized: Single point cane Level of assistance: Modified independence Comments: Patient very unstable with increased swaying during gait, slow, step to/step through   TODAY'S TREATMENT:                                                                                                                              DATE:  11/25/22  Education   PATIENT EDUCATION:  Education details: POC Person educated: Patient and Spouse Education method: Explanation Education comprehension: verbalized understanding  HOME EXERCISE PROGRAM: TBD  ASSESSMENT:  CLINICAL IMPRESSION: Patient is a 77 y.o. who was seen today for physical therapy evaluation and treatment for unsteadiness and falls. She presents with weakness throughout trunk and LE, decreased activity tolerance, decreased balance, difficulty walking. She reports 3 recent falls, unable to get up from floor when she falls. She will benefit from PT to address her weakness and balance deficits with goal of improved safety and I with all functional mobility to decrease fall risk.   OBJECTIVE IMPAIRMENTS: Abnormal gait, decreased activity tolerance, decreased balance, difficulty walking, decreased ROM, decreased strength, and postural dysfunction.   ACTIVITY LIMITATIONS: carrying, lifting, bending, standing, squatting, stairs, and locomotion level  PARTICIPATION LIMITATIONS: meal prep, cleaning, laundry, driving, shopping, and community activity  PERSONAL FACTORS: Past/current experiences are also affecting patient's functional outcome.   REHAB POTENTIAL: Good  CLINICAL DECISION MAKING: Stable/uncomplicated  EVALUATION COMPLEXITY: Low   GOALS: Goals reviewed with patient? Yes  SHORT TERM GOALS: Target date: 12/10/22 I with initial HEP Baseline: Goal status: INITIAL  LONG TERM GOALS: Target date: 02/03/23  I with final HEP Baseline:  Goal status:  INITIAL  2.  Increase BLE strength to at least 4/5 Baseline:  Goal status: INITIAL  3.  Complete 5X STS in < 12 sec without compensation Baseline:  Goal status: INITIAL  4.  Complete TUG in < 12 sec Baseline:  Goal status: INITIAL  5.  Patient will ambulate x at least 300' with LRAD, MI with normalized gait pattern- no increased sway, no instability, including turns. Baseline:  Goal status: INITIAL  6.  Standing<> Floor transfers with S, using AD as needed. Baseline:  Goal status: INITIAL   PLAN:  PT FREQUENCY: 1-2x/week  PT DURATION: 10 weeks  PLANNED INTERVENTIONS: 97146- PT Re-evaluation, 97110-Therapeutic exercises, 97530- Therapeutic activity, 97112- Neuromuscular re-education, 97535- Self Care, 29562- Manual therapy, [phone]- Gait training, Patient/Family education, Balance training, and Stair training  PLAN FOR NEXT SESSION: Initiate HEP   Iona Beard, DPT 11/25/2022, 12:25 PM

## 2022-11-25 ENCOUNTER — Encounter: Payer: Self-pay | Admitting: Physical Therapy

## 2022-11-25 ENCOUNTER — Ambulatory Visit: Payer: Medicare Other | Attending: General Practice | Admitting: Physical Therapy

## 2022-11-25 DIAGNOSIS — R2681 Unsteadiness on feet: Secondary | ICD-10-CM | POA: Diagnosis present

## 2022-11-25 DIAGNOSIS — R262 Difficulty in walking, not elsewhere classified: Secondary | ICD-10-CM | POA: Diagnosis present

## 2022-11-25 DIAGNOSIS — M6281 Muscle weakness (generalized): Secondary | ICD-10-CM | POA: Insufficient documentation

## 2022-11-25 DIAGNOSIS — R296 Repeated falls: Secondary | ICD-10-CM | POA: Insufficient documentation

## 2022-12-04 ENCOUNTER — Other Ambulatory Visit: Payer: Self-pay

## 2022-12-04 ENCOUNTER — Ambulatory Visit: Payer: Medicare Other

## 2022-12-04 DIAGNOSIS — R262 Difficulty in walking, not elsewhere classified: Secondary | ICD-10-CM

## 2022-12-04 DIAGNOSIS — R296 Repeated falls: Secondary | ICD-10-CM

## 2022-12-04 DIAGNOSIS — R2681 Unsteadiness on feet: Secondary | ICD-10-CM

## 2022-12-04 DIAGNOSIS — M6281 Muscle weakness (generalized): Secondary | ICD-10-CM

## 2022-12-04 NOTE — Therapy (Signed)
OUTPATIENT PHYSICAL THERAPY LOWER EXTREMITY TREATMENT   Patient Name: Beth Haynes MRN: [id number] DOB:Jun 23, 1945, 77 y.o., female Today's Date: 12/04/2022  END OF SESSION:  PT End of Session - 12/04/22 1003     Visit Number 2    Date for PT Re-Evaluation 02/03/23    Progress Note Due on Visit 10    PT Start Time 0930    PT Stop Time 1015    PT Time Calculation (min) 45 min    Activity Tolerance Patient tolerated treatment well    Behavior During Therapy [facility] for tasks assessed/performed              Past Medical History:  Diagnosis Date   Atrial fibrillation (HCC)    Basal cell carcinoma 07/23/2018   right cheekbone-CX35FU   IBS (irritable bowel syndrome)    Obesity (BMI 30-39.9) 07/11/2015   OSA (obstructive sleep apnea) 01/17/2015   Severe with AHI 35.8/hr   Panic attacks    Urinary incontinence, nocturnal enuresis    Past Surgical History:  Procedure Laterality Date   CARDIOVERSION N/A 04/02/2017   Procedure: CARDIOVERSION;  Surgeon: Chilton Si, MD;  Location: [facility] ENDOSCOPY;  Service: Cardiovascular;  Laterality: N/A;   SKIN BIOPSY N/A 07/23/2018   mid upper back-moderate-0 tmt   TONSILLECTOMY     Patient Active Problem List   Diagnosis Date Noted   MCI (mild cognitive impairment) 12/21/2020   Difficulty with adaptive servo-ventilation (ASV) use 12/21/2020   Excessive daytime sleepiness 12/21/2020   Epistaxis 05/16/2020   Right-sided epistaxis    Anxiety 04/26/2019   Hyperlipidemia 04/26/2019   Intracerebral hemorrhage 10/24/2017   Paroxysmal atrial fibrillation (HCC) 05/06/2017   Intolerance of continuous positive airway pressure (CPAP) ventilation 05/06/2017   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   Persistent atrial fibrillation (HCC) 02/22/2017   Depression 02/20/2017   Chronic diastolic CHF (congestive heart failure) (HCC) 02/20/2017   Obstructive sleep apnea hypopnea, severe 06/12/2016   Obesity (BMI 30-39.9) 07/11/2015   OSA (obstructive  sleep apnea) 01/17/2015   HTN (hypertension) 01/17/2015    PCP: Shellia Cleverly, PA  REFERRING PROVIDER: Shellia Cleverly, PA  REFERRING DIAG: falls, unsteady gait  THERAPY DIAG:  Repeated falls  Muscle weakness (generalized)  Unsteadiness on feet  Difficulty in walking, not elsewhere classified  Rationale for Evaluation and Treatment: Rehabilitation  ONSET DATE: 11/05/22  SUBJECTIVE:   SUBJECTIVE STATEMENT: Patient reports that she has not fallen since PT initial evaluation.   PERTINENT HISTORY: Per referring provider order: Husband would like PT assessment due to falls and unsteadiness. PAIN:  Are you having pain? Patient reports some general achiness, worse in the morning.  PRECAUTIONS: Fall  RED FLAGS: None   WEIGHT BEARING RESTRICTIONS: No  FALLS:  Has patient fallen in last 6 months? Yes. Number of falls 3, seem random, no patterns.  LIVING ENVIRONMENT: Lives with: lives with their family Lives in: House/apartment Stairs: Yes: Internal: 14 steps; can reach both and External: 6 steps; on left going up Has following equipment at home: Single point cane  OCCUPATION: N/A- goes to exercise class twice/week.  PLOF: Independent, has shower, difficulty getting in and out.  PATIENT GOALS: Prevent additional falls, increase speed of movement.  NEXT MD VISIT: unknown  OBJECTIVE:  Note: Objective measures were completed at Evaluation unless otherwise noted.  DIAGNOSTIC FINDINGS: N/A  COGNITION: Overall cognitive status: Within functional limits for tasks assessed     SENSATION: WFL  EDEMA:  Patient denies edema  MUSCLE LENGTH: Hamstrings: Right  60 deg; Left 60 deg  POSTURE: rounded shoulders, forward head, increased thoracic kyphosis, and flexed trunk   PALPATION: No TTP  LOWER EXTREMITY ROM: BLE ROM WNL   LOWER EXTREMITY MMT: B LE (3+)-4-/5 throughout, Trunk unstable and weak   FUNCTIONAL TESTS:  5 times sit to stand: 20.72 used arm support  and leaned back against mat to rise. Timed up and go (TUG): 19.06 Functional gait assessment: N/A  GAIT: Distance walked: In clinic distances Assistive device utilized: Single point cane Level of assistance: Modified independence Comments: Patient very unstable with increased swaying during gait, slow, step to/step through   TODAY'S TREATMENT:                                                                                                                              DATE:  12/04/22: Therapeutic exercise: instructed the patient initially in therex to perform at home per list below:  Standing toe raises 10x Standing alt hip abd 10x Standing alt hip ext 10x Standing marching 10x Sit to stand from chair with pillow under hips to alter height: 5x 2  Also instructed pt in the performance of the following ex:  Seated long arc quads 2# 10x Seated hamstring curls green band  10x Seated hip abd/ER with green band 10x Nustep level 3, x 5 min Gait x 40' x 2 with st cane and SBA  11/25/22  Education   PATIENT EDUCATION:  Education details: POC Person educated: Patient and Spouse Education method: Explanation Education comprehension: verbalized understanding  HOME EXERCISE PROGRAM: TBD Access Code: [email or web address] URL: [email or web address] Date: 12/04/2022 Prepared by: Lazer Wollard  Exercises - Standing Heel Raises  - 1 x daily - 7 x weekly - 3 sets - 10 reps - Standing Hip Abduction with Counter Support  - 1 x daily - 7 x weekly - 3 sets - 10 reps - Standing Hip Extension with Counter Support  - 1 x daily - 7 x weekly - 3 sets - 10 reps - Sit to Stand with Armchair  - 1 x daily - 7 x weekly - 3 sets - 10 reps - March in Place  - 1 x daily - 7 x weekly - 3 sets - 10 reps ASSESSMENT:  CLINICAL IMPRESSION: Patient is a 77 y.o. who participated today in skilled  physical therapy  treatment for unsteadiness and falls. Progressed today with home exercise instruction, as well  as additional Le strengthening and endurance activities.  She needed frequent repetition regarding technique and follow through at home.  At time somewhat short of breath but recovered quickly. She will benefit from PT to address her weakness and balance deficits with goal of improved safety and I with all functional mobility to decrease fall risk.   OBJECTIVE IMPAIRMENTS: Abnormal gait, decreased activity tolerance, decreased balance, difficulty walking, decreased ROM, decreased strength, and postural dysfunction.   ACTIVITY LIMITATIONS: carrying, lifting, bending, standing, squatting, stairs, and locomotion level  PARTICIPATION  LIMITATIONS: meal prep, cleaning, laundry, driving, shopping, and community activity  PERSONAL FACTORS: Past/current experiences are also affecting patient's functional outcome.   REHAB POTENTIAL: Good  CLINICAL DECISION MAKING: Stable/uncomplicated  EVALUATION COMPLEXITY: Low   GOALS: Goals reviewed with patient? Yes  SHORT TERM GOALS: Target date: 12/10/22 I with initial HEP Baseline: Goal status: INITIAL  LONG TERM GOALS: Target date: 02/03/23  I with final HEP Baseline:  Goal status: INITIAL  2.  Increase BLE strength to at least 4/5 Baseline:  Goal status: INITIAL  3.  Complete 5X STS in < 12 sec without compensation Baseline:  Goal status: INITIAL  4.  Complete TUG in < 12 sec Baseline:  Goal status: INITIAL  5.  Patient will ambulate x at least 300' with LRAD, MI with normalized gait pattern- no increased sway, no instability, including turns. Baseline:  Goal status: INITIAL  6.  Standing<> Floor transfers with S, using AD as needed. Baseline:  Goal status: INITIAL   PLAN:  PT FREQUENCY: 1-2x/week  PT DURATION: 10 weeks  PLANNED INTERVENTIONS: 97146- PT Re-evaluation, 97110-Therapeutic exercises, 97530- Therapeutic activity, [id number]- Neuromuscular re-education, 97535- Self Care, [id number]- Manual therapy, [phone]- Gait training,  Patient/Family education, Balance training, and Stair training  PLAN FOR NEXT SESSION: review HEP, continue with endurance, strengthening ex LE and trunk   Treanna Dumler, PT, DPT, OCS 12/04/2022, 12:51 PM

## 2022-12-06 ENCOUNTER — Encounter: Payer: Self-pay | Admitting: Physical Therapy

## 2022-12-06 ENCOUNTER — Ambulatory Visit: Payer: Medicare Other | Admitting: Physical Therapy

## 2022-12-06 DIAGNOSIS — R2681 Unsteadiness on feet: Secondary | ICD-10-CM

## 2022-12-06 DIAGNOSIS — R262 Difficulty in walking, not elsewhere classified: Secondary | ICD-10-CM

## 2022-12-06 DIAGNOSIS — R296 Repeated falls: Secondary | ICD-10-CM

## 2022-12-06 DIAGNOSIS — M6281 Muscle weakness (generalized): Secondary | ICD-10-CM

## 2022-12-06 NOTE — Therapy (Signed)
OUTPATIENT PHYSICAL THERAPY LOWER EXTREMITY TREATMENT   Patient Name: Beth Haynes MRN: [id number] DOB:12/21/1945, 77 y.o., female Today's Date: 12/06/2022  END OF SESSION:  PT End of Session - 12/06/22 0941     Visit Number 3    Date for PT Re-Evaluation 02/03/23    PT Start Time 0934    PT Stop Time 1012    PT Time Calculation (min) 38 min    Activity Tolerance Patient tolerated treatment well    Behavior During Therapy [facility] for tasks assessed/performed               Past Medical History:  Diagnosis Date   Atrial fibrillation (HCC)    Basal cell carcinoma 07/23/2018   right cheekbone-[id number]   IBS (irritable bowel syndrome)    Obesity (BMI 30-39.9) 07/11/2015   OSA (obstructive sleep apnea) 01/17/2015   Severe with AHI 35.8/hr   Panic attacks    Urinary incontinence, nocturnal enuresis    Past Surgical History:  Procedure Laterality Date   CARDIOVERSION N/A 04/02/2017   Procedure: CARDIOVERSION;  Surgeon: Chilton Si, MD;  Location: [facility] ENDOSCOPY;  Service: Cardiovascular;  Laterality: N/A;   SKIN BIOPSY N/A 07/23/2018   mid upper back-moderate-0 tmt   TONSILLECTOMY     Patient Active Problem List   Diagnosis Date Noted   MCI (mild cognitive impairment) 12/21/2020   Difficulty with adaptive servo-ventilation (ASV) use 12/21/2020   Excessive daytime sleepiness 12/21/2020   Epistaxis 05/16/2020   Right-sided epistaxis    Anxiety 04/26/2019   Hyperlipidemia 04/26/2019   Intracerebral hemorrhage 10/24/2017   Paroxysmal atrial fibrillation (HCC) 05/06/2017   Intolerance of continuous positive airway pressure (CPAP) ventilation 05/06/2017   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   Persistent atrial fibrillation (HCC) 02/22/2017   Depression 02/20/2017   Chronic diastolic CHF (congestive heart failure) (HCC) 02/20/2017   Obstructive sleep apnea hypopnea, severe 06/12/2016   Obesity (BMI 30-39.9) 07/11/2015   OSA (obstructive sleep apnea) 01/17/2015   HTN  (hypertension) 01/17/2015    PCP: Shellia Cleverly, PA  REFERRING PROVIDER: Shellia Cleverly, PA  REFERRING DIAG: falls, unsteady gait  THERAPY DIAG:  Repeated falls  Muscle weakness (generalized)  Unsteadiness on feet  Difficulty in walking, not elsewhere classified  Rationale for Evaluation and Treatment: Rehabilitation  ONSET DATE: 11/05/22  SUBJECTIVE:   SUBJECTIVE STATEMENT: Patient reports no new issues. She is moving better in the mornings. [facility]   PERTINENT HISTORY: Per referring provider order: Husband would like PT assessment due to falls and unsteadiness. PAIN:  Are you having pain? Patient reports some general achiness, worse in the morning.  PRECAUTIONS: Fall  RED FLAGS: None   WEIGHT BEARING RESTRICTIONS: No  FALLS:  Has patient fallen in last 6 months? Yes. Number of falls 3, seem random, no patterns.  LIVING ENVIRONMENT: Lives with: lives with their family Lives in: House/apartment Stairs: Yes: Internal: 14 steps; can reach both and External: 6 steps; on left going up Has following equipment at home: Single point cane  OCCUPATION: N/A- goes to exercise class twice/week.  PLOF: Independent, has shower, difficulty getting in and out.  PATIENT GOALS: Prevent additional falls, increase speed of movement.  NEXT MD VISIT: unknown  OBJECTIVE:  Note: Objective measures were completed at Evaluation unless otherwise noted.  DIAGNOSTIC FINDINGS: N/A  COGNITION: Overall cognitive status: Within functional limits for tasks assessed     SENSATION: WFL  EDEMA:  Patient denies edema  MUSCLE LENGTH: Hamstrings: Right  60 deg; Left 60 deg  POSTURE: rounded shoulders, forward head, increased thoracic kyphosis, and flexed trunk   PALPATION: No TTP  LOWER EXTREMITY ROM: BLE ROM WNL   LOWER EXTREMITY MMT: B LE (3+)-4-/5 throughout, Trunk unstable and weak   FUNCTIONAL TESTS:  5 times sit to stand: 20.72 used  arm support and leaned back against mat to rise. Timed up and go (TUG): 19.06 Functional gait assessment: N/A  GAIT: Distance walked: In clinic distances Assistive device utilized: Single point cane Level of assistance: Modified independence Comments: Patient very unstable with increased swaying during gait, slow, step to/step through   TODAY'S TREATMENT:                                                                                                                              DATE:  12/06/22 NuStep L4 x 6 minutes Supine stretch with physioball- DKTC, piriformis Supine strength with physioball- bridge, quick side to side rotations. Attempted mini squats, tended to bend over at hips only, difficulty performing accurately even with mod VC Quick side to side steps with BUE support x 10 to each side. Alternating step taps on 2" step with SPC, progressed to  multiple taps with each foot.  12/04/22: Therapeutic exercise: instructed the patient initially in therex to perform at home per list below:  Standing toe raises 10x Standing alt hip abd 10x Standing alt hip ext 10x Standing marching 10x Sit to stand from chair with pillow under hips to alter height: 5x 2  Also instructed pt in the performance of the following ex:  Seated long arc quads 2# 10x Seated hamstring curls green band  10x Seated hip abd/ER with green band 10x Nustep level 3, x 5 min Gait x 40' x 2 with st cane and SBA  11/25/22  Education   PATIENT EDUCATION:  Education details: POC Person educated: Patient and Spouse Education method: Explanation Education comprehension: verbalized understanding  HOME EXERCISE PROGRAM: TBD Access Code: [email or web address] URL: [email or web address] Date: 12/04/2022 Prepared by: Amy Speaks  Exercises - Standing Heel Raises  - 1 x daily - 7 x weekly - 3 sets - 10 reps - Standing Hip Abduction with Counter Support  - 1 x daily - 7 x weekly - 3 sets - 10 reps -  Standing Hip Extension with Counter Support  - 1 x daily - 7 x weekly - 3 sets - 10 reps - Sit to Stand with Armchair  - 1 x daily - 7 x weekly - 3 sets - 10 reps - March in Place  - 1 x daily - 7 x weekly - 3 sets - 10 reps  ASSESSMENT:  CLINICAL IMPRESSION: Patient reports no falls. Treatment focused on activation of trunk stabilizers, functional strengthening, and balance activities. Performed some quick movements to facilitate speed of movement and decrease fall risk.  OBJECTIVE IMPAIRMENTS: Abnormal gait, decreased activity tolerance, decreased balance, difficulty walking, decreased ROM, decreased strength, and postural dysfunction.  ACTIVITY LIMITATIONS: carrying, lifting, bending, standing, squatting, stairs, and locomotion level  PARTICIPATION LIMITATIONS: meal prep, cleaning, laundry, driving, shopping, and community activity  PERSONAL FACTORS: Past/current experiences are also affecting patient's functional outcome.   REHAB POTENTIAL: Good  CLINICAL DECISION MAKING: Stable/uncomplicated  EVALUATION COMPLEXITY: Low   GOALS: Goals reviewed with patient? Yes  SHORT TERM GOALS: Target date: 12/10/22 I with initial HEP Baseline: Goal status: 12/06/22 Initiated  LONG TERM GOALS: Target date: 02/03/23  I with final HEP Baseline:  Goal status: INITIAL  2.  Increase BLE strength to at least 4/5 Baseline:  Goal status: INITIAL  3.  Complete 5X STS in < 12 sec without compensation Baseline:  Goal status: INITIAL  4.  Complete TUG in < 12 sec Baseline:  Goal status: INITIAL  5.  Patient will ambulate x at least 300' with LRAD, MI with normalized gait pattern- no increased sway, no instability, including turns. Baseline:  Goal status: INITIAL  6.  Standing<> Floor transfers with S, using AD as needed. Baseline:  Goal status: INITIAL   PLAN:  PT FREQUENCY: 1-2x/week  PT DURATION: 10 weeks  PLANNED INTERVENTIONS: 97146- PT Re-evaluation, 97110-Therapeutic  exercises, 97530- Therapeutic activity, [id number]- Neuromuscular re-education, 97535- Self Care, [id number]- Manual therapy, [phone]- Gait training, Patient/Family education, Balance training, and Stair training  PLAN FOR NEXT SESSION: review HEP, continue with endurance, strengthening ex LE and trunk   Oley Balm DPT 12/06/22 10:14 AM  12/06/2022, 10:14 AM

## 2022-12-09 ENCOUNTER — Other Ambulatory Visit: Payer: Self-pay

## 2022-12-09 ENCOUNTER — Ambulatory Visit: Payer: Medicare Other

## 2022-12-09 DIAGNOSIS — M6281 Muscle weakness (generalized): Secondary | ICD-10-CM

## 2022-12-09 DIAGNOSIS — R262 Difficulty in walking, not elsewhere classified: Secondary | ICD-10-CM

## 2022-12-09 DIAGNOSIS — R296 Repeated falls: Secondary | ICD-10-CM

## 2022-12-09 DIAGNOSIS — R2681 Unsteadiness on feet: Secondary | ICD-10-CM

## 2022-12-09 NOTE — Therapy (Signed)
OUTPATIENT PHYSICAL THERAPY LOWER EXTREMITY TREATMENT   Patient Name: Beth Haynes MRN: [id number] DOB:1945/10/29, 77 y.o., female Today's Date: 12/09/2022  END OF SESSION:  PT End of Session - 12/09/22 1209     Visit Number 4    Date for PT Re-Evaluation 02/03/23    Progress Note Due on Visit 10    PT Start Time 0932    PT Stop Time 1015    PT Time Calculation (min) 43 min    Activity Tolerance Patient tolerated treatment well    Behavior During Therapy [facility] for tasks assessed/performed                Past Medical History:  Diagnosis Date   Atrial fibrillation (HCC)    Basal cell carcinoma 07/23/2018   right cheekbone-[id number]   IBS (irritable bowel syndrome)    Obesity (BMI 30-39.9) 07/11/2015   OSA (obstructive sleep apnea) 01/17/2015   Severe with AHI 35.8/hr   Panic attacks    Urinary incontinence, nocturnal enuresis    Past Surgical History:  Procedure Laterality Date   CARDIOVERSION N/A 04/02/2017   Procedure: CARDIOVERSION;  Surgeon: Chilton Si, MD;  Location: [facility] ENDOSCOPY;  Service: Cardiovascular;  Laterality: N/A;   SKIN BIOPSY N/A 07/23/2018   mid upper back-moderate-0 tmt   TONSILLECTOMY     Patient Active Problem List   Diagnosis Date Noted   MCI (mild cognitive impairment) 12/21/2020   Difficulty with adaptive servo-ventilation (ASV) use 12/21/2020   Excessive daytime sleepiness 12/21/2020   Epistaxis 05/16/2020   Right-sided epistaxis    Anxiety 04/26/2019   Hyperlipidemia 04/26/2019   Intracerebral hemorrhage 10/24/2017   Paroxysmal atrial fibrillation (HCC) 05/06/2017   Intolerance of continuous positive airway pressure (CPAP) ventilation 05/06/2017   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   Persistent atrial fibrillation (HCC) 02/22/2017   Depression 02/20/2017   Chronic diastolic CHF (congestive heart failure) (HCC) 02/20/2017   Obstructive sleep apnea hypopnea, severe 06/12/2016   Obesity (BMI 30-39.9) 07/11/2015   OSA  (obstructive sleep apnea) 01/17/2015   HTN (hypertension) 01/17/2015    PCP: Shellia Cleverly, PA  REFERRING PROVIDER: Shellia Cleverly, PA  REFERRING DIAG: falls, unsteady gait  THERAPY DIAG:  Repeated falls  Muscle weakness (generalized)  Unsteadiness on feet  Difficulty in walking, not elsewhere classified  Rationale for Evaluation and Treatment: Rehabilitation  ONSET DATE: 11/05/22  SUBJECTIVE:   SUBJECTIVE STATEMENT: 12/09/22: this weekend got up and tried to get back in the bed, feet slipped on the floor so I slid down to my bottom.  My husband had to get the neighbor to help me stand up  PERTINENT HISTORY: Per referring provider order: Husband would like PT assessment due to falls and unsteadiness. PAIN:  Are you having pain? Patient reports some general achiness, worse in the morning.  PRECAUTIONS: Fall  RED FLAGS: None   WEIGHT BEARING RESTRICTIONS: No  FALLS:  Has patient fallen in last 6 months? Yes. Number of falls 3, seem random, no patterns.  LIVING ENVIRONMENT: Lives with: lives with their family Lives in: House/apartment Stairs: Yes: Internal: 14 steps; can reach both and External: 6 steps; on left going up Has following equipment at home: Single point cane  OCCUPATION: N/A- goes to exercise class twice/week.  PLOF: Independent, has shower, difficulty getting in and out.  PATIENT GOALS: Prevent additional falls, increase speed of movement.  NEXT MD VISIT: unknown  OBJECTIVE:  Note: Objective measures were completed at Evaluation unless otherwise noted.  DIAGNOSTIC FINDINGS: N/A  COGNITION: Overall cognitive status: Within functional limits for tasks assessed     SENSATION: WFL  EDEMA:  Patient denies edema  MUSCLE LENGTH: Hamstrings: Right 60 deg; Left 60 deg  POSTURE: rounded shoulders, forward head, increased thoracic kyphosis, and flexed trunk   PALPATION: No TTP  LOWER EXTREMITY ROM: BLE ROM WNL   LOWER EXTREMITY MMT: B LE  (3+)-4-/5 throughout, Trunk unstable and weak   FUNCTIONAL TESTS:  5 times sit to stand: 20.72 used arm support and leaned back against mat to rise. Timed up and go (TUG): 19.06 Functional gait assessment: N/A  GAIT: Distance walked: In clinic distances Assistive device utilized: Single point cane Level of assistance: Modified independence Comments: Patient very unstable with increased swaying during gait, slow, step to/step through   TODAY'S TREATMENT:                                                                                                                              DATE:  12/09/22:Therapeutic exercise:  Nustep L5, 6 min Ue's and LEs's Supine with 65 cm, green physioball for stretching and strengthening B hips: B knee to chest, 15x LTR's 15x Bridging, 15x Side stepping in ll bars on airex balance beam, 3 laps Standing in ll bars for heel raises  Sit to stand with B HHA from chair with pillow in seat 5 reps, 2 sets  Seated hamstring curls green band 15 x each  Therapeutic activity:  Floor transfers:  patient was instructed/ assisted in seated to floor and floor to standing transfers.  She required use of gait belt and + 2 assist eventually to move from floor to standing from a seated position, unable to position in side sitting, 1/2 kneeling or B kneeling today, with max assist and use of mat. Showed/recommended to pt/ husband a bed rail to assist with transfers/ avoid falls   12/06/22 NuStep L4 x 6 minutes Supine stretch with physioball- DKTC, piriformis Supine strength with physioball- bridge, quick side to side rotations. Attempted mini squats, tended to bend over at hips only, difficulty performing accurately even with mod VC Quick side to side steps with BUE support x 10 to each side. Alternating step taps on 2" step with SPC, progressed to  multiple taps with each foot.  12/04/22: Therapeutic exercise: instructed the patient initially in therex to perform at  home per list below:  Standing toe raises 10x Standing alt hip abd 10x Standing alt hip ext 10x Standing marching 10x Sit to stand from chair with pillow under hips to alter height: 5x 2  Also instructed pt in the performance of the following ex:  Seated long arc quads 2# 10x Seated hamstring curls green band  10x Seated hip abd/ER with green band 10x Nustep level 3, x 5 min Gait x 40' x 2 with st cane and SBA  11/25/22  Education   PATIENT EDUCATION:  Education details: POC Person educated: Patient and Spouse Education method: Explanation  Education comprehension: verbalized understanding  HOME EXERCISE PROGRAM: TBD Access Code: [email or web address] URL: [email or web address] Date: 12/04/2022 Prepared by: Raneem Mendolia  Exercises - Standing Heel Raises  - 1 x daily - 7 x weekly - 3 sets - 10 reps - Standing Hip Abduction with Counter Support  - 1 x daily - 7 x weekly - 3 sets - 10 reps - Standing Hip Extension with Counter Support  - 1 x daily - 7 x weekly - 3 sets - 10 reps - Sit to Stand with Armchair  - 1 x daily - 7 x weekly - 3 sets - 10 reps - March in Place  - 1 x daily - 7 x weekly - 3 sets - 10 reps  ASSESSMENT:  CLINICAL IMPRESSION: Patient reports no falls. Treatment focused on activation of trunk stabilizers, functional strengthening, and balance activities. Performed some quick movements to facilitate speed of movement and decrease fall risk.  OBJECTIVE IMPAIRMENTS: Abnormal gait, decreased activity tolerance, decreased balance, difficulty walking, decreased ROM, decreased strength, and postural dysfunction.   ACTIVITY LIMITATIONS: carrying, lifting, bending, standing, squatting, stairs, and locomotion level  PARTICIPATION LIMITATIONS: meal prep, cleaning, laundry, driving, shopping, and community activity  PERSONAL FACTORS: Past/current experiences are also affecting patient's functional outcome.   REHAB POTENTIAL: Good  CLINICAL DECISION MAKING:  Stable/uncomplicated  EVALUATION COMPLEXITY: Low   GOALS: Goals reviewed with patient? Yes  SHORT TERM GOALS: Target date: 12/10/22 I with initial HEP Baseline: Goal status: 12/06/22 Initiated  LONG TERM GOALS: Target date: 02/03/23  I with final HEP Baseline:  Goal status: INITIAL  2.  Increase BLE strength to at least 4/5 Baseline:  Goal status: INITIAL  3.  Complete 5X STS in < 12 sec without compensation Baseline:  Goal status: INITIAL  4.  Complete TUG in < 12 sec Baseline:  Goal status: INITIAL  5.  Patient will ambulate x at least 300' with LRAD, MI with normalized gait pattern- no increased sway, no instability, including turns. Baseline:  Goal status: INITIAL  6.  Standing<> Floor transfers with S, using AD as needed. Baseline:  Goal status: INITIAL   PLAN:  PT FREQUENCY: 1-2x/week  PT DURATION: 10 weeks  PLANNED INTERVENTIONS: 97146- PT Re-evaluation, 97110-Therapeutic exercises, 97530- Therapeutic activity, 97112- Neuromuscular re-education, 97535- Self Care, 16109- Manual therapy, [phone]- Gait training, Patient/Family education, Balance training, and Stair training  PLAN FOR NEXT SESSION: review HEP, continue with endurance, strengthening ex LE and trunk   Adelei Scobey, PT, DPT, OCS  12/09/2022, 12:19 PM

## 2022-12-11 ENCOUNTER — Ambulatory Visit: Payer: Medicare Other

## 2022-12-16 ENCOUNTER — Encounter: Payer: Self-pay | Admitting: Physical Therapy

## 2022-12-16 ENCOUNTER — Ambulatory Visit: Payer: Medicare Other | Attending: General Practice | Admitting: Physical Therapy

## 2022-12-16 DIAGNOSIS — R2681 Unsteadiness on feet: Secondary | ICD-10-CM | POA: Diagnosis present

## 2022-12-16 DIAGNOSIS — R296 Repeated falls: Secondary | ICD-10-CM | POA: Diagnosis present

## 2022-12-16 DIAGNOSIS — M6281 Muscle weakness (generalized): Secondary | ICD-10-CM

## 2022-12-16 DIAGNOSIS — R262 Difficulty in walking, not elsewhere classified: Secondary | ICD-10-CM

## 2022-12-16 NOTE — Therapy (Signed)
OUTPATIENT PHYSICAL THERAPY LOWER EXTREMITY TREATMENT   Patient Name: Beth Haynes MRN: [id number] DOB:August 31, 1945, 77 y.o., female Today's Date: 12/16/2022  END OF SESSION:  PT End of Session - 12/16/22 1022     Visit Number 5    Date for PT Re-Evaluation 02/03/23    PT Start Time 1015    PT Stop Time 1055    PT Time Calculation (min) 40 min    Activity Tolerance Patient tolerated treatment well    Behavior During Therapy [facility] for tasks assessed/performed            Past Medical History:  Diagnosis Date   Atrial fibrillation (HCC)    Basal cell carcinoma 07/23/2018   right cheekbone-[id number]   IBS (irritable bowel syndrome)    Obesity (BMI 30-39.9) 07/11/2015   OSA (obstructive sleep apnea) 01/17/2015   Severe with AHI 35.8/hr   Panic attacks    Urinary incontinence, nocturnal enuresis    Past Surgical History:  Procedure Laterality Date   CARDIOVERSION N/A 04/02/2017   Procedure: CARDIOVERSION;  Surgeon: Chilton Si, MD;  Location: [facility] ENDOSCOPY;  Service: Cardiovascular;  Laterality: N/A;   SKIN BIOPSY N/A 07/23/2018   mid upper back-moderate-0 tmt   TONSILLECTOMY     Patient Active Problem List   Diagnosis Date Noted   MCI (mild cognitive impairment) 12/21/2020   Difficulty with adaptive servo-ventilation (ASV) use 12/21/2020   Excessive daytime sleepiness 12/21/2020   Epistaxis 05/16/2020   Right-sided epistaxis    Anxiety 04/26/2019   Hyperlipidemia 04/26/2019   Intracerebral hemorrhage 10/24/2017   Paroxysmal atrial fibrillation (HCC) 05/06/2017   Intolerance of continuous positive airway pressure (CPAP) ventilation 05/06/2017   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   Persistent atrial fibrillation (HCC) 02/22/2017   Depression 02/20/2017   Chronic diastolic CHF (congestive heart failure) (HCC) 02/20/2017   Obstructive sleep apnea hypopnea, severe 06/12/2016   Obesity (BMI 30-39.9) 07/11/2015   OSA (obstructive sleep apnea) 01/17/2015   HTN  (hypertension) 01/17/2015    PCP: Shellia Cleverly, PA  REFERRING PROVIDER: Shellia Cleverly, PA  REFERRING DIAG: falls, unsteady gait  THERAPY DIAG:  Repeated falls  Muscle weakness (generalized)  Unsteadiness on feet  Difficulty in walking, not elsewhere classified  Rationale for Evaluation and Treatment: Rehabilitation  ONSET DATE: 11/05/22  SUBJECTIVE:   SUBJECTIVE STATEMENT: Patient reports that she has a fall the other day due to her cane catching on a doorframe.  PERTINENT HISTORY: Per referring provider order: Husband would like PT assessment due to falls and unsteadiness. PAIN:  Are you having pain? Patient reports some general achiness, worse in the morning.  PRECAUTIONS: Fall  RED FLAGS: None   WEIGHT BEARING RESTRICTIONS: No  FALLS:  Has patient fallen in last 6 months? Yes. Number of falls 3, seem random, no patterns.  LIVING ENVIRONMENT: Lives with: lives with their family Lives in: House/apartment Stairs: Yes: Internal: 14 steps; can reach both and External: 6 steps; on left going up Has following equipment at home: Single point cane  OCCUPATION: N/A- goes to exercise class twice/week.  PLOF: Independent, has shower, difficulty getting in and out.  PATIENT GOALS: Prevent additional falls, increase speed of movement.  NEXT MD VISIT: unknown  OBJECTIVE:  Note: Objective measures were completed at Evaluation unless otherwise noted.  DIAGNOSTIC FINDINGS: N/A  COGNITION: Overall cognitive status: Within functional limits for tasks assessed     SENSATION: WFL  EDEMA:  Patient denies edema  MUSCLE LENGTH: Hamstrings: Right 60 deg; Left 60 deg  POSTURE: rounded shoulders, forward head, increased thoracic kyphosis, and flexed trunk   PALPATION: No TTP  LOWER EXTREMITY ROM: BLE ROM WNL   LOWER EXTREMITY MMT: B LE (3+)-4-/5 throughout, Trunk unstable and weak   FUNCTIONAL TESTS:  5 times sit to stand: 20.72 used arm support and leaned  back against mat to rise. Timed up and go (TUG): 19.06 Functional gait assessment: N/A  GAIT: Distance walked: In clinic distances Assistive device utilized: Single point cane Level of assistance: Modified independence Comments: Patient very unstable with increased swaying during gait, slow, step to/step through   TODAY'S TREATMENT:                                                                                                                              DATE:  12/16/22 NuStep L3 x 6 minutes March on air ex with [facility] and HHA x 10 each Standing at counter, NBOS-stand with No UE support and then with slight ant/post weight shifts, S, no LOB. Repeat with feet in stride position, then step back into rotation and reach back x 10 each. Standing B side to side step x 10 each way, SPC and CGA, no unsteadiness noted. Sit to stand from slightly elevated surface with 2# ball in BUE, OHP at top, x 10 reps Stp ups onto 4" step, 2 x 5 reps each side with BUE support  12/09/22:Therapeutic exercise:  Nustep L5, 6 min Ue's and LEs's Supine with 65 cm, green physioball for stretching and strengthening B hips: B knee to chest, 15x LTR's 15x Bridging, 15x Side stepping in ll bars on airex balance beam, 3 laps Standing in ll bars for heel raises  Sit to stand with B HHA from chair with pillow in seat 5 reps, 2 sets  Seated hamstring curls green band 15 x each  Therapeutic activity:  Floor transfers:  patient was instructed/ assisted in seated to floor and floor to standing transfers.  She required use of gait belt and + 2 assist eventually to move from floor to standing from a seated position, unable to position in side sitting, 1/2 kneeling or B kneeling today, with max assist and use of mat. Showed/recommended to pt/ husband a bed rail to assist with transfers/ avoid falls   12/06/22 NuStep L4 x 6 minutes Supine stretch with physioball- DKTC, piriformis Supine strength with physioball- bridge,  quick side to side rotations. Attempted mini squats, tended to bend over at hips only, difficulty performing accurately even with mod VC Quick side to side steps with BUE support x 10 to each side. Alternating step taps on 2" step with SPC, progressed to  multiple taps with each foot.  12/04/22: Therapeutic exercise: instructed the patient initially in therex to perform at home per list below:  Standing toe raises 10x Standing alt hip abd 10x Standing alt hip ext 10x Standing marching 10x Sit to stand from chair with pillow under hips to alter height: 5x 2  Also instructed pt in the performance of the following ex:  Seated long arc quads 2# 10x Seated hamstring curls green band  10x Seated hip abd/ER with green band 10x Nustep level 3, x 5 min Gait x 40' x 2 with st cane and SBA  11/25/22  Education   PATIENT EDUCATION:  Education details: POC Person educated: Patient and Spouse Education method: Explanation Education comprehension: verbalized understanding  HOME EXERCISE PROGRAM: TBD Access Code: [email or web address] URL: [email or web address] Date: 12/04/2022 Prepared by: Amy Speaks  Exercises - Standing Heel Raises  - 1 x daily - 7 x weekly - 3 sets - 10 reps - Standing Hip Abduction with Counter Support  - 1 x daily - 7 x weekly - 3 sets - 10 reps - Standing Hip Extension with Counter Support  - 1 x daily - 7 x weekly - 3 sets - 10 reps - Sit to Stand with Armchair  - 1 x daily - 7 x weekly - 3 sets - 10 reps - March in Place  - 1 x daily - 7 x weekly - 3 sets - 10 reps  ASSESSMENT:  CLINICAL IMPRESSION: Patient reports that she fell while walking in the hall. Her cane got caught on a door frame and she did hit her head. She was able to scoot over to the stairway and stand up using the rail. Treatment focused on both strength and balance training to minimize her fall risk. She demonstrates improved balance in stride position.  OBJECTIVE IMPAIRMENTS: Abnormal  gait, decreased activity tolerance, decreased balance, difficulty walking, decreased ROM, decreased strength, and postural dysfunction.   ACTIVITY LIMITATIONS: carrying, lifting, bending, standing, squatting, stairs, and locomotion level  PARTICIPATION LIMITATIONS: meal prep, cleaning, laundry, driving, shopping, and community activity  PERSONAL FACTORS: Past/current experiences are also affecting patient's functional outcome.   REHAB POTENTIAL: Good  CLINICAL DECISION MAKING: Stable/uncomplicated  EVALUATION COMPLEXITY: Low   GOALS: Goals reviewed with patient? Yes  SHORT TERM GOALS: Target date: 12/10/22 I with initial HEP Baseline: Goal status: 12/16/22- met  LONG TERM GOALS: Target date: 02/03/23  I with final HEP Baseline:  Goal status: INITIAL  2.  Increase BLE strength to at least 4/5 Baseline:  Goal status: INITIAL  3.  Complete 5X STS in < 12 sec without compensation Baseline:  Goal status: INITIAL  4.  Complete TUG in < 12 sec Baseline:  Goal status: INITIAL  5.  Patient will ambulate x at least 300' with LRAD, MI with normalized gait pattern- no increased sway, no instability, including turns. Baseline:  Goal status: INITIAL  6.  Standing<> Floor transfers with S, using AD as needed. Baseline:  Goal status: INITIAL   PLAN:  PT FREQUENCY: 1-2x/week  PT DURATION: 10 weeks  PLANNED INTERVENTIONS: 97146- PT Re-evaluation, 97110-Therapeutic exercises, 97530- Therapeutic activity, 97112- Neuromuscular re-education, 97535- Self Care, 16109- Manual therapy, [phone]- Gait training, Patient/Family education, Balance training, and Stair training  PLAN FOR NEXT SESSION: review HEP, continue with endurance, strengthening ex LE and trunk  Oley Balm DPT 12/16/22 10:57 AM

## 2022-12-18 ENCOUNTER — Encounter: Payer: Self-pay | Admitting: Physical Therapy

## 2022-12-18 ENCOUNTER — Ambulatory Visit: Payer: Medicare Other | Admitting: Physical Therapy

## 2022-12-18 DIAGNOSIS — R296 Repeated falls: Secondary | ICD-10-CM

## 2022-12-18 DIAGNOSIS — R262 Difficulty in walking, not elsewhere classified: Secondary | ICD-10-CM

## 2022-12-18 DIAGNOSIS — R2681 Unsteadiness on feet: Secondary | ICD-10-CM

## 2022-12-18 DIAGNOSIS — M6281 Muscle weakness (generalized): Secondary | ICD-10-CM

## 2022-12-18 NOTE — Therapy (Signed)
OUTPATIENT PHYSICAL THERAPY LOWER EXTREMITY TREATMENT   Patient Name: NATALE Haynes MRN: [id number] DOB:03-26-1945, 77 y.o., female Today's Date: 12/18/2022  END OF SESSION:  PT End of Session - 12/18/22 1024     Visit Number 6    Date for PT Re-Evaluation 02/03/23    PT Start Time 1017    PT Stop Time 1057    PT Time Calculation (min) 40 min    Activity Tolerance Patient tolerated treatment well    Behavior During Therapy [facility] for tasks assessed/performed             Past Medical History:  Diagnosis Date   Atrial fibrillation (HCC)    Basal cell carcinoma 07/23/2018   right cheekbone-[id number]   IBS (irritable bowel syndrome)    Obesity (BMI 30-39.9) 07/11/2015   OSA (obstructive sleep apnea) 01/17/2015   Severe with AHI 35.8/hr   Panic attacks    Urinary incontinence, nocturnal enuresis    Past Surgical History:  Procedure Laterality Date   CARDIOVERSION N/A 04/02/2017   Procedure: CARDIOVERSION;  Surgeon: Chilton Si, MD;  Location: [facility] ENDOSCOPY;  Service: Cardiovascular;  Laterality: N/A;   SKIN BIOPSY N/A 07/23/2018   mid upper back-moderate-0 tmt   TONSILLECTOMY     Patient Active Problem List   Diagnosis Date Noted   MCI (mild cognitive impairment) 12/21/2020   Difficulty with adaptive servo-ventilation (ASV) use 12/21/2020   Excessive daytime sleepiness 12/21/2020   Epistaxis 05/16/2020   Right-sided epistaxis    Anxiety 04/26/2019   Hyperlipidemia 04/26/2019   Intracerebral hemorrhage 10/24/2017   Paroxysmal atrial fibrillation (HCC) 05/06/2017   Intolerance of continuous positive airway pressure (CPAP) ventilation 05/06/2017   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   Persistent atrial fibrillation (HCC) 02/22/2017   Depression 02/20/2017   Chronic diastolic CHF (congestive heart failure) (HCC) 02/20/2017   Obstructive sleep apnea hypopnea, severe 06/12/2016   Obesity (BMI 30-39.9) 07/11/2015   OSA (obstructive sleep apnea) 01/17/2015   HTN  (hypertension) 01/17/2015    PCP: Shellia Cleverly, PA  REFERRING PROVIDER: Shellia Cleverly, PA  REFERRING DIAG: falls, unsteady gait  THERAPY DIAG:  Repeated falls  Muscle weakness (generalized)  Unsteadiness on feet  Difficulty in walking, not elsewhere classified  Rationale for Evaluation and Treatment: Rehabilitation  ONSET DATE: 11/05/22  SUBJECTIVE:   SUBJECTIVE STATEMENT: Patient reports no additional falls. Her ribs are still sore.  PERTINENT HISTORY: Per referring provider order: Husband would like PT assessment due to falls and unsteadiness. PAIN:  Are you having pain? Patient reports some general achiness, worse in the morning.  PRECAUTIONS: Fall  RED FLAGS: None   WEIGHT BEARING RESTRICTIONS: No  FALLS:  Has patient fallen in last 6 months? Yes. Number of falls 3, seem random, no patterns.  LIVING ENVIRONMENT: Lives with: lives with their family Lives in: House/apartment Stairs: Yes: Internal: 14 steps; can reach both and External: 6 steps; on left going up Has following equipment at home: Single point cane  OCCUPATION: N/A- goes to exercise class twice/week.  PLOF: Independent, has shower, difficulty getting in and out.  PATIENT GOALS: Prevent additional falls, increase speed of movement.  NEXT MD VISIT: unknown  OBJECTIVE:  Note: Objective measures were completed at Evaluation unless otherwise noted.  DIAGNOSTIC FINDINGS: N/A  COGNITION: Overall cognitive status: Within functional limits for tasks assessed     SENSATION: WFL  EDEMA:  Patient denies edema  MUSCLE LENGTH: Hamstrings: Right 60 deg; Left 60 deg  POSTURE: rounded shoulders, forward head, increased  thoracic kyphosis, and flexed trunk   PALPATION: No TTP  LOWER EXTREMITY ROM: BLE ROM WNL   LOWER EXTREMITY MMT: B LE (3+)-4-/5 throughout, Trunk unstable and weak   FUNCTIONAL TESTS:  5 times sit to stand: 20.72 used arm support and leaned back against mat to  rise. Timed up and go (TUG): 19.06 Functional gait assessment: N/A  GAIT: Distance walked: In clinic distances Assistive device utilized: Single point cane Level of assistance: Modified independence Comments: Patient very unstable with increased swaying during gait, slow, step to/step through   TODAY'S TREATMENT:                                                                                                                              DATE:  12/18/22 Seated forward flexion over physioball to stretch trunk and open ribs. NuStep L4 x 4 minutes Walking on black floormat, 4 x 6" with turns, RUE HHA, occasional light min A for balance Standing alternating taps on cone with side step, HHA Requested additional forward flexion over ball Standing at counter-Stride position with rotation x 5 each side, UUE support on counter Standing heel raises in stride position at counter B side step on air ex plank in parallel bars, BUE support, 3 x each way  12/16/22 NuStep L3 x 6 minutes March on air ex with [facility] and HHA x 10 each Standing at counter, NBOS-stand with No UE support and then with slight ant/post weight shifts, S, no LOB. Repeat with feet in stride position, then step back into rotation and reach back x 10 each. Standing B side to side step x 10 each way, SPC and CGA, no unsteadiness noted. Sit to stand from slightly elevated surface with 2# ball in BUE, OHP at top, x 10 reps Stp ups onto 4" step, 2 x 5 reps each side with BUE support  12/09/22:Therapeutic exercise:  Nustep L5, 6 min Ue's and LEs's Supine with 65 cm, green physioball for stretching and strengthening B hips: B knee to chest, 15x LTR's 15x Bridging, 15x Side stepping in ll bars on airex balance beam, 3 laps Standing in ll bars for heel raises  Sit to stand with B HHA from chair with pillow in seat 5 reps, 2 sets  Seated hamstring curls green band 15 x each  Therapeutic activity:  Floor transfers:  patient was  instructed/ assisted in seated to floor and floor to standing transfers.  She required use of gait belt and + 2 assist eventually to move from floor to standing from a seated position, unable to position in side sitting, 1/2 kneeling or B kneeling today, with max assist and use of mat. Showed/recommended to pt/ husband a bed rail to assist with transfers/ avoid falls   12/06/22 NuStep L4 x 6 minutes Supine stretch with physioball- DKTC, piriformis Supine strength with physioball- bridge, quick side to side rotations. Attempted mini squats, tended to bend over at hips only, difficulty performing accurately  even with mod VC Quick side to side steps with BUE support x 10 to each side. Alternating step taps on 2" step with SPC, progressed to  multiple taps with each foot.  12/04/22: Therapeutic exercise: instructed the patient initially in therex to perform at home per list below:  Standing toe raises 10x Standing alt hip abd 10x Standing alt hip ext 10x Standing marching 10x Sit to stand from chair with pillow under hips to alter height: 5x 2  Also instructed pt in the performance of the following ex:  Seated long arc quads 2# 10x Seated hamstring curls green band  10x Seated hip abd/ER with green band 10x Nustep level 3, x 5 min Gait x 40' x 2 with st cane and SBA  11/25/22  Education   PATIENT EDUCATION:  Education details: POC Person educated: Patient and Spouse Education method: Explanation Education comprehension: verbalized understanding  HOME EXERCISE PROGRAM: TBD Access Code: [email or web address] URL: [email or web address] Date: 12/04/2022 Prepared by: Amy Speaks  Exercises - Standing Heel Raises  - 1 x daily - 7 x weekly - 3 sets - 10 reps - Standing Hip Abduction with Counter Support  - 1 x daily - 7 x weekly - 3 sets - 10 reps - Standing Hip Extension with Counter Support  - 1 x daily - 7 x weekly - 3 sets - 10 reps - Sit to Stand with Armchair  - 1 x daily -  7 x weekly - 3 sets - 10 reps - March in Place  - 1 x daily - 7 x weekly - 3 sets - 10 reps  ASSESSMENT:  CLINICAL IMPRESSION: Patient reports no additional falls. Treatment focused on trunk mobility and balance as she reported fatigue, so avoided heavy strength training.  OBJECTIVE IMPAIRMENTS: Abnormal gait, decreased activity tolerance, decreased balance, difficulty walking, decreased ROM, decreased strength, and postural dysfunction.   ACTIVITY LIMITATIONS: carrying, lifting, bending, standing, squatting, stairs, and locomotion level  PARTICIPATION LIMITATIONS: meal prep, cleaning, laundry, driving, shopping, and community activity  PERSONAL FACTORS: Past/current experiences are also affecting patient's functional outcome.   REHAB POTENTIAL: Good  CLINICAL DECISION MAKING: Stable/uncomplicated  EVALUATION COMPLEXITY: Low   GOALS: Goals reviewed with patient? Yes  SHORT TERM GOALS: Target date: 12/10/22 I with initial HEP Baseline: Goal status: 12/16/22- met  LONG TERM GOALS: Target date: 02/03/23  I with final HEP Baseline:  Goal status: INITIAL  2.  Increase BLE strength to at least 4/5 Baseline:  Goal status: INITIAL  3.  Complete 5X STS in < 12 sec without compensation Baseline:  Goal status: INITIAL  4.  Complete TUG in < 12 sec Baseline:  Goal status: INITIAL  5.  Patient will ambulate x at least 300' with LRAD, MI with normalized gait pattern- no increased sway, no instability, including turns. Baseline:  Goal status: INITIAL  6.  Standing<> Floor transfers with S, using AD as needed. Baseline:  Goal status: INITIAL   PLAN:  PT FREQUENCY: 1-2x/week  PT DURATION: 10 weeks  PLANNED INTERVENTIONS: 97146- PT Re-evaluation, 97110-Therapeutic exercises, 97530- Therapeutic activity, 97112- Neuromuscular re-education, 97535- Self Care, 16109- Manual therapy, [phone]- Gait training, Patient/Family education, Balance training, and Stair training  PLAN  FOR NEXT SESSION: review HEP, continue with endurance, strengthening ex LE and trunk  Oley Balm DPT 12/18/22 10:56 AM

## 2022-12-23 ENCOUNTER — Encounter: Payer: Self-pay | Admitting: Physical Therapy

## 2022-12-23 ENCOUNTER — Ambulatory Visit: Payer: Medicare Other | Admitting: Physical Therapy

## 2022-12-23 DIAGNOSIS — R296 Repeated falls: Secondary | ICD-10-CM

## 2022-12-23 DIAGNOSIS — R2681 Unsteadiness on feet: Secondary | ICD-10-CM

## 2022-12-23 DIAGNOSIS — M6281 Muscle weakness (generalized): Secondary | ICD-10-CM

## 2022-12-23 DIAGNOSIS — R262 Difficulty in walking, not elsewhere classified: Secondary | ICD-10-CM

## 2022-12-23 NOTE — Therapy (Signed)
OUTPATIENT PHYSICAL THERAPY LOWER EXTREMITY TREATMENT   Patient Name: Beth Haynes MRN: [id number] DOB:Mar 08, 1945, 77 y.o., female Today's Date: 12/23/2022  END OF SESSION:  PT End of Session - 12/23/22 1020     Visit Number 7    Date for PT Re-Evaluation 02/03/23    PT Start Time 1016    PT Stop Time 1057    PT Time Calculation (min) 41 min    Activity Tolerance Patient tolerated treatment well    Behavior During Therapy [facility] for tasks assessed/performed            Past Medical History:  Diagnosis Date   Atrial fibrillation (HCC)    Basal cell carcinoma 07/23/2018   right cheekbone-[id number]   IBS (irritable bowel syndrome)    Obesity (BMI 30-39.9) 07/11/2015   OSA (obstructive sleep apnea) 01/17/2015   Severe with AHI 35.8/hr   Panic attacks    Urinary incontinence, nocturnal enuresis    Past Surgical History:  Procedure Laterality Date   CARDIOVERSION N/A 04/02/2017   Procedure: CARDIOVERSION;  Surgeon: Chilton Si, MD;  Location: [facility] ENDOSCOPY;  Service: Cardiovascular;  Laterality: N/A;   SKIN BIOPSY N/A 07/23/2018   mid upper back-moderate-0 tmt   TONSILLECTOMY     Patient Active Problem List   Diagnosis Date Noted   MCI (mild cognitive impairment) 12/21/2020   Difficulty with adaptive servo-ventilation (ASV) use 12/21/2020   Excessive daytime sleepiness 12/21/2020   Epistaxis 05/16/2020   Right-sided epistaxis    Anxiety 04/26/2019   Hyperlipidemia 04/26/2019   Intracerebral hemorrhage 10/24/2017   Paroxysmal atrial fibrillation (HCC) 05/06/2017   Intolerance of continuous positive airway pressure (CPAP) ventilation 05/06/2017   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   Persistent atrial fibrillation (HCC) 02/22/2017   Depression 02/20/2017   Chronic diastolic CHF (congestive heart failure) (HCC) 02/20/2017   Obstructive sleep apnea hypopnea, severe 06/12/2016   Obesity (BMI 30-39.9) 07/11/2015   OSA (obstructive sleep apnea) 01/17/2015   HTN  (hypertension) 01/17/2015    PCP: Shellia Cleverly, PA  REFERRING PROVIDER: Shellia Cleverly, PA  REFERRING DIAG: falls, unsteady gait  THERAPY DIAG:  Repeated falls  Muscle weakness (generalized)  Unsteadiness on feet  Difficulty in walking, not elsewhere classified  Rationale for Evaluation and Treatment: Rehabilitation  ONSET DATE: 11/05/22  SUBJECTIVE:   SUBJECTIVE STATEMENT: Patient reports no additional falls. Her ribs are still sore, but she used a heating pad on her ribs at night, which seems to have helped some with the pain.  PERTINENT HISTORY: Per referring provider order: Husband would like PT assessment due to falls and unsteadiness. PAIN:  Are you having pain? Patient reports some general achiness, worse in the morning.  PRECAUTIONS: Fall  RED FLAGS: None   WEIGHT BEARING RESTRICTIONS: No  FALLS:  Has patient fallen in last 6 months? Yes. Number of falls 3, seem random, no patterns.  LIVING ENVIRONMENT: Lives with: lives with their family Lives in: House/apartment Stairs: Yes: Internal: 14 steps; can reach both and External: 6 steps; on left going up Has following equipment at home: Single point cane  OCCUPATION: N/A- goes to exercise class twice/week.  PLOF: Independent, has shower, difficulty getting in and out.  PATIENT GOALS: Prevent additional falls, increase speed of movement.  NEXT MD VISIT: unknown  OBJECTIVE:  Note: Objective measures were completed at Evaluation unless otherwise noted.  DIAGNOSTIC FINDINGS: N/A  COGNITION: Overall cognitive status: Within functional limits for tasks assessed     SENSATION: WFL  EDEMA:  Patient  denies edema  MUSCLE LENGTH: Hamstrings: Right 60 deg; Left 60 deg  POSTURE: rounded shoulders, forward head, increased thoracic kyphosis, and flexed trunk   PALPATION: No TTP  LOWER EXTREMITY ROM: BLE ROM WNL   LOWER EXTREMITY MMT: B LE (3+)-4-/5 throughout, Trunk unstable and  weak   FUNCTIONAL TESTS:  5 times sit to stand: 20.72 used arm support and leaned back against mat to rise. Timed up and go (TUG): 19.06 Functional gait assessment: N/A  GAIT: Distance walked: In clinic distances Assistive device utilized: Single point cane Level of assistance: Modified independence Comments: Patient very unstable with increased swaying during gait, slow, step to/step through   TODAY'S TREATMENT:                                                                                                                              DATE:  12/23/22 NuStep L4 x 6 minutes Seated rotational reach out to side, x 10 each direction. Seated long kicks, 3#, 2 x 10 reps each leg Standing hip abd, ext and march with 3# weights on each ankle, 10 reps each, with RW for UE support and balance. Seated shoulder ext, rows, ER with yellow Tband. Had difficulty holding appropriate form. 10 each with mod VC for posture and technique. B side to side step while holding 2# WATE bar in BUE at chest height, 15 reps to each side, VC for posture and form. 10 mini squats at mat Ambulation x 40' with 2# WATE bar held at chest height, CGA, but no unsteadiness noted.  12/18/22 Seated forward flexion over physioball to stretch trunk and open ribs. NuStep L4 x 4 minutes Walking on black floormat, 4 x 6" with turns, RUE HHA, occasional light min A for balance Standing alternating taps on cone with side step, HHA Requested additional forward flexion over ball Standing at counter-Stride position with rotation x 5 each side, UUE support on counter Standing heel raises in stride position at counter B side step on air ex plank in parallel bars, BUE support, 3 x each way  12/16/22 NuStep L3 x 6 minutes March on air ex with [place] and HHA x 10 each Standing at counter, NBOS-stand with No UE support and then with slight ant/post weight shifts, S, no LOB. Repeat with feet in stride position, then step back into rotation  and reach back x 10 each. Standing B side to side step x 10 each way, SPC and CGA, no unsteadiness noted. Sit to stand from slightly elevated surface with 2# ball in BUE, OHP at top, x 10 reps Stp ups onto 4" step, 2 x 5 reps each side with BUE support  12/09/22:Therapeutic exercise:  Nustep L5, 6 min Ue's and LEs's Supine with 65 cm, green physioball for stretching and strengthening B hips: B knee to chest, 15x LTR's 15x Bridging, 15x Side stepping in ll bars on airex balance beam, 3 laps Standing in ll bars for heel raises  Sit to stand with B HHA from chair with pillow in seat 5 reps, 2 sets  Seated hamstring curls green band 15 x each  Therapeutic activity:  Floor transfers:  patient was instructed/ assisted in seated to floor and floor to standing transfers.  She required use of gait belt and + 2 assist eventually to move from floor to standing from a seated position, unable to position in side sitting, 1/2 kneeling or B kneeling today, with max assist and use of mat. Showed/recommended to pt/ husband a bed rail to assist with transfers/ avoid falls   12/06/22 NuStep L4 x 6 minutes Supine stretch with physioball- DKTC, piriformis Supine strength with physioball- bridge, quick side to side rotations. Attempted mini squats, tended to bend over at hips only, difficulty performing accurately even with mod VC Quick side to side steps with BUE support x 10 to each side. Alternating step taps on 2" step with SPC, progressed to  multiple taps with each foot.  12/04/22: Therapeutic exercise: instructed the patient initially in therex to perform at home per list below:  Standing toe raises 10x Standing alt hip abd 10x Standing alt hip ext 10x Standing marching 10x Sit to stand from chair with pillow under hips to alter height: 5x 2  Also instructed pt in the performance of the following ex:  Seated long arc quads 2# 10x Seated hamstring curls green band  10x Seated hip abd/ER with  green band 10x Nustep level 3, x 5 min Gait x 40' x 2 with st cane and SBA  11/25/22  Education   PATIENT EDUCATION:  Education details: POC Person educated: Patient and Spouse Education method: Explanation Education comprehension: verbalized understanding  HOME EXERCISE PROGRAM: TBD Access Code: [email or web address] URL: [email or web address] Date: 12/04/2022 Prepared by: Amy Speaks  Exercises - Standing Heel Raises  - 1 x daily - 7 x weekly - 3 sets - 10 reps - Standing Hip Abduction with Counter Support  - 1 x daily - 7 x weekly - 3 sets - 10 reps - Standing Hip Extension with Counter Support  - 1 x daily - 7 x weekly - 3 sets - 10 reps - Sit to Stand with Armchair  - 1 x daily - 7 x weekly - 3 sets - 10 reps - March in Place  - 1 x daily - 7 x weekly - 3 sets - 10 reps  ASSESSMENT:  CLINICAL IMPRESSION: Patient reports no additional falls. Treatment focused on trunk and LE strength and stability as well as balance to decrease fall risk. Required mod VC for form with most of her activities, but good effort.  OBJECTIVE IMPAIRMENTS: Abnormal gait, decreased activity tolerance, decreased balance, difficulty walking, decreased ROM, decreased strength, and postural dysfunction.   ACTIVITY LIMITATIONS: carrying, lifting, bending, standing, squatting, stairs, and locomotion level  PARTICIPATION LIMITATIONS: meal prep, cleaning, laundry, driving, shopping, and community activity  PERSONAL FACTORS: Past/current experiences are also affecting patient's functional outcome.   REHAB POTENTIAL: Good  CLINICAL DECISION MAKING: Stable/uncomplicated  EVALUATION COMPLEXITY: Low   GOALS: Goals reviewed with patient? Yes  SHORT TERM GOALS: Target date: 12/10/22 I with initial HEP Baseline: Goal status: 12/16/22- met  LONG TERM GOALS: Target date: 02/03/23  I with final HEP Baseline:  Goal status: INITIAL  2.  Increase BLE strength to at least 4/5 Baseline:  Goal  status: INITIAL  3.  Complete 5X STS in < 12 sec without compensation Baseline:  Goal status: INITIAL  4.  Complete TUG  in < 12 sec Baseline:  Goal status: INITIAL  5.  Patient will ambulate x at least 300' with LRAD, MI with normalized gait pattern- no increased sway, no instability, including turns. Baseline:  Goal status: INITIAL  6.  Standing<> Floor transfers with S, using AD as needed. Baseline:  Goal status: INITIAL   PLAN:  PT FREQUENCY: 1-2x/week  PT DURATION: 10 weeks  PLANNED INTERVENTIONS: 97146- PT Re-evaluation, 97110-Therapeutic exercises, 97530- Therapeutic activity, 97112- Neuromuscular re-education, 97535- Self Care, 25956- Manual therapy, [phone]- Gait training, Patient/Family education, Balance training, and Stair training  PLAN FOR NEXT SESSION: review HEP, continue with endurance, strengthening ex LE and trunk  Oley Balm DPT 12/23/22 10:57 AM

## 2022-12-25 ENCOUNTER — Ambulatory Visit: Payer: Medicare Other | Admitting: Physical Therapy

## 2022-12-25 ENCOUNTER — Encounter: Payer: Self-pay | Admitting: Physical Therapy

## 2022-12-25 DIAGNOSIS — R2681 Unsteadiness on feet: Secondary | ICD-10-CM

## 2022-12-25 DIAGNOSIS — R296 Repeated falls: Secondary | ICD-10-CM

## 2022-12-25 DIAGNOSIS — M6281 Muscle weakness (generalized): Secondary | ICD-10-CM

## 2022-12-25 DIAGNOSIS — R262 Difficulty in walking, not elsewhere classified: Secondary | ICD-10-CM

## 2022-12-25 NOTE — Therapy (Signed)
OUTPATIENT PHYSICAL THERAPY LOWER EXTREMITY TREATMENT   Patient Name: Beth Haynes MRN: [id number] DOB:1946-01-28, 77 y.o., female Today's Date: 12/25/2022  END OF SESSION:  PT End of Session - 12/25/22 1013     Visit Number 8    Date for PT Re-Evaluation 02/03/23    PT Start Time 1013    PT Stop Time 1058    PT Time Calculation (min) 45 min    Activity Tolerance Patient tolerated treatment well    Behavior During Therapy [facility] for tasks assessed/performed             Past Medical History:  Diagnosis Date   Atrial fibrillation (HCC)    Basal cell carcinoma 07/23/2018   right cheekbone-[id number]   IBS (irritable bowel syndrome)    Obesity (BMI 30-39.9) 07/11/2015   OSA (obstructive sleep apnea) 01/17/2015   Severe with AHI 35.8/hr   Panic attacks    Urinary incontinence, nocturnal enuresis    Past Surgical History:  Procedure Laterality Date   CARDIOVERSION N/A 04/02/2017   Procedure: CARDIOVERSION;  Surgeon: Chilton Si, MD;  Location: [facility] ENDOSCOPY;  Service: Cardiovascular;  Laterality: N/A;   SKIN BIOPSY N/A 07/23/2018   mid upper back-moderate-0 tmt   TONSILLECTOMY     Patient Active Problem List   Diagnosis Date Noted   MCI (mild cognitive impairment) 12/21/2020   Difficulty with adaptive servo-ventilation (ASV) use 12/21/2020   Excessive daytime sleepiness 12/21/2020   Epistaxis 05/16/2020   Right-sided epistaxis    Anxiety 04/26/2019   Hyperlipidemia 04/26/2019   Intracerebral hemorrhage 10/24/2017   Paroxysmal atrial fibrillation (HCC) 05/06/2017   Intolerance of continuous positive airway pressure (CPAP) ventilation 05/06/2017   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   Persistent atrial fibrillation (HCC) 02/22/2017   Depression 02/20/2017   Chronic diastolic CHF (congestive heart failure) (HCC) 02/20/2017   Obstructive sleep apnea hypopnea, severe 06/12/2016   Obesity (BMI 30-39.9) 07/11/2015   OSA (obstructive sleep apnea) 01/17/2015   HTN  (hypertension) 01/17/2015    PCP: Shellia Cleverly, PA  REFERRING PROVIDER: Shellia Cleverly, PA  REFERRING DIAG: falls, unsteady gait  THERAPY DIAG:  Repeated falls  Muscle weakness (generalized)  Unsteadiness on feet  Difficulty in walking, not elsewhere classified  Rationale for Evaluation and Treatment: Rehabilitation  ONSET DATE: 11/05/22  SUBJECTIVE:   SUBJECTIVE STATEMENT: Patient reports no additional falls. She has been told by several people that she is walking better. Her ribs feel better, but still occasionally give her some pain.  PERTINENT HISTORY: Per referring provider order: Husband would like PT assessment due to falls and unsteadiness. PAIN:  Are you having pain? Patient reports some general achiness, worse in the morning.  PRECAUTIONS: Fall  RED FLAGS: None   WEIGHT BEARING RESTRICTIONS: No  FALLS:  Has patient fallen in last 6 months? Yes. Number of falls 3, seem random, no patterns.  LIVING ENVIRONMENT: Lives with: lives with their family Lives in: House/apartment Stairs: Yes: Internal: 14 steps; can reach both and External: 6 steps; on left going up Has following equipment at home: Single point cane  OCCUPATION: N/A- goes to exercise class twice/week.  PLOF: Independent, has shower, difficulty getting in and out.  PATIENT GOALS: Prevent additional falls, increase speed of movement.  NEXT MD VISIT: unknown  OBJECTIVE:  Note: Objective measures were completed at Evaluation unless otherwise noted.  DIAGNOSTIC FINDINGS: N/A  COGNITION: Overall cognitive status: Within functional limits for tasks assessed     SENSATION: WFL  EDEMA:  Patient denies  edema  MUSCLE LENGTH: Hamstrings: Right 60 deg; Left 60 deg  POSTURE: rounded shoulders, forward head, increased thoracic kyphosis, and flexed trunk   PALPATION: No TTP  LOWER EXTREMITY ROM: BLE ROM WNL  LOWER EXTREMITY MMT: B LE (3+)-4-/5 throughout, Trunk unstable and  weak  FUNCTIONAL TESTS:  5 times sit to stand: 20.72 used arm support and leaned back against mat to rise. Timed up and go (TUG): 19.06 Functional gait assessment: N/A  GAIT: Distance walked: In clinic distances Assistive device utilized: Single point cane Level of assistance: Modified independence Comments: Patient very unstable with increased swaying during gait, slow, step to/step through  TODAY'S TREATMENT:                                                                                                                              DATE:  12/25/22 NuStep L5 x 6 min. Mini squats at mat, required max VC and occasional TC to perform correctly as she tended to just bend forward at hips. B side stepping while holding 2# WATE bar at chest height, 2 x 8 steps each way B side stepping up onto Airex pad and off, x 10 each direction, using SPC and HHA Seated long kicks against 3# weight, knee flexion against Red Tband, 15 reps each, ball squeeze between knees with long kicks x 10 each. Walking while holding 2# ball in BUE at chest height to engage trunk. Improved upright posture, step length, speed of gait noted. Performed stepping in all directions with direction changes upon therapist command while holding the ball at chest height, CGA, no unsteadiness noted.  12/23/22 NuStep L4 x 6 minutes Seated rotational reach out to side, x 10 each direction. Seated long kicks, 3#, 2 x 10 reps each leg Standing hip abd, ext and march with 3# weights on each ankle, 10 reps each, with RW for UE support and balance. Seated shoulder ext, rows, ER with yellow Tband. Had difficulty holding appropriate form. 10 each with mod VC for posture and technique. B side to side step while holding 2# WATE bar in BUE at chest height, 15 reps to each side, VC for posture and form. 10 mini squats at mat Ambulation x 40' with 2# WATE bar held at chest height, CGA, but no unsteadiness noted.  12/18/22 Seated forward  flexion over physioball to stretch trunk and open ribs. NuStep L4 x 4 minutes Walking on black floormat, 4 x 6" with turns, RUE HHA, occasional light min A for balance Standing alternating taps on cone with side step, HHA Requested additional forward flexion over ball Standing at counter-Stride position with rotation x 5 each side, UUE support on counter Standing heel raises in stride position at counter B side step on air ex plank in parallel bars, BUE support, 3 x each way  12/16/22 NuStep L3 x 6 minutes March on air ex with [facility] and HHA x 10 each Standing at counter, NBOS-stand with No UE  support and then with slight ant/post weight shifts, S, no LOB. Repeat with feet in stride position, then step back into rotation and reach back x 10 each. Standing B side to side step x 10 each way, SPC and CGA, no unsteadiness noted. Sit to stand from slightly elevated surface with 2# ball in BUE, OHP at top, x 10 reps Stp ups onto 4" step, 2 x 5 reps each side with BUE support  12/09/22:Therapeutic exercise:  Nustep L5, 6 min Ue's and LEs's Supine with 65 cm, green physioball for stretching and strengthening B hips: B knee to chest, 15x LTR's 15x Bridging, 15x Side stepping in ll bars on airex balance beam, 3 laps Standing in ll bars for heel raises  Sit to stand with B HHA from chair with pillow in seat 5 reps, 2 sets  Seated hamstring curls green band 15 x each  Therapeutic activity:  Floor transfers:  patient was instructed/ assisted in seated to floor and floor to standing transfers.  She required use of gait belt and + 2 assist eventually to move from floor to standing from a seated position, unable to position in side sitting, 1/2 kneeling or B kneeling today, with max assist and use of mat. Showed/recommended to pt/ husband a bed rail to assist with transfers/ avoid falls   12/06/22 NuStep L4 x 6 minutes Supine stretch with physioball- DKTC, piriformis Supine strength with physioball-  bridge, quick side to side rotations. Attempted mini squats, tended to bend over at hips only, difficulty performing accurately even with mod VC Quick side to side steps with BUE support x 10 to each side. Alternating step taps on 2" step with SPC, progressed to  multiple taps with each foot.  12/04/22: Therapeutic exercise: instructed the patient initially in therex to perform at home per list below:  Standing toe raises 10x Standing alt hip abd 10x Standing alt hip ext 10x Standing marching 10x Sit to stand from chair with pillow under hips to alter height: 5x 2  Also instructed pt in the performance of the following ex:  Seated long arc quads 2# 10x Seated hamstring curls green band  10x Seated hip abd/ER with green band 10x Nustep level 3, x 5 min Gait x 40' x 2 with st cane and SBA  11/25/22  Education   PATIENT EDUCATION:  Education details: POC Person educated: Patient and Spouse Education method: Explanation Education comprehension: verbalized understanding  HOME EXERCISE PROGRAM: TBD Access Code: [email or web address] URL: [email or web address] Date: 12/04/2022 Prepared by: Amy Speaks  Exercises - Standing Heel Raises  - 1 x daily - 7 x weekly - 3 sets - 10 reps - Standing Hip Abduction with Counter Support  - 1 x daily - 7 x weekly - 3 sets - 10 reps - Standing Hip Extension with Counter Support  - 1 x daily - 7 x weekly - 3 sets - 10 reps - Sit to Stand with Armchair  - 1 x daily - 7 x weekly - 3 sets - 10 reps - March in Place  - 1 x daily - 7 x weekly - 3 sets - 10 reps  ASSESSMENT:  CLINICAL IMPRESSION: Patient reports no additional falls. Continued to emphasize strength, motor engagement to build stability, and balance training, increased activities on her feet with improved balance and posture noted in stance and gait.  OBJECTIVE IMPAIRMENTS: Abnormal gait, decreased activity tolerance, decreased balance, difficulty walking, decreased ROM, decreased  strength, and postural dysfunction.   ACTIVITY LIMITATIONS:  carrying, lifting, bending, standing, squatting, stairs, and locomotion level  PARTICIPATION LIMITATIONS: meal prep, cleaning, laundry, driving, shopping, and community activity  PERSONAL FACTORS: Past/current experiences are also affecting patient's functional outcome.   REHAB POTENTIAL: Good  CLINICAL DECISION MAKING: Stable/uncomplicated  EVALUATION COMPLEXITY: Low   GOALS: Goals reviewed with patient? Yes  SHORT TERM GOALS: Target date: 12/10/22 I with initial HEP Baseline: Goal status: 12/16/22- met  LONG TERM GOALS: Target date: 02/03/23  I with final HEP Baseline:  Goal status: INITIAL  2.  Increase BLE strength to at least 4/5 Baseline:  Goal status: INITIAL  3.  Complete 5X STS in < 12 sec without compensation Baseline:  Goal status: INITIAL  4.  Complete TUG in < 12 sec Baseline:  Goal status: INITIAL  5.  Patient will ambulate x at least 300' with LRAD, MI with normalized gait pattern- no increased sway, no instability, including turns. Baseline:  Goal status: INITIAL  6.  Standing<> Floor transfers with S, using AD as needed. Baseline:  Goal status: INITIAL   PLAN:  PT FREQUENCY: 1-2x/week  PT DURATION: 10 weeks  PLANNED INTERVENTIONS: 97146- PT Re-evaluation, 97110-Therapeutic exercises, 97530- Therapeutic activity, 97112- Neuromuscular re-education, 97535- Self Care, [id number]- Manual therapy, [phone]- Gait training, Patient/Family education, Balance training, and Stair training  PLAN FOR NEXT SESSION: review HEP, continue with endurance, strengthening ex LE and trunk  Oley Balm DPT 12/25/22 11:01 AM

## 2023-01-01 ENCOUNTER — Other Ambulatory Visit: Payer: Self-pay

## 2023-01-01 ENCOUNTER — Ambulatory Visit: Payer: Medicare Other

## 2023-01-01 DIAGNOSIS — R2681 Unsteadiness on feet: Secondary | ICD-10-CM

## 2023-01-01 DIAGNOSIS — M6281 Muscle weakness (generalized): Secondary | ICD-10-CM

## 2023-01-01 DIAGNOSIS — R296 Repeated falls: Secondary | ICD-10-CM | POA: Diagnosis not present

## 2023-01-01 DIAGNOSIS — R262 Difficulty in walking, not elsewhere classified: Secondary | ICD-10-CM

## 2023-01-01 NOTE — Therapy (Signed)
OUTPATIENT PHYSICAL THERAPY LOWER EXTREMITY TREATMENT   Patient Name: Beth Haynes MRN: [id number] DOB:[birth year], 77 y.o., female Today's Date: 01/01/2023  END OF SESSION:  PT End of Session - 01/01/23 1309     Visit Number 9    Date for PT Re-Evaluation 02/03/23    Progress Note Due on Visit 10    PT Start Time 1300    PT Stop Time 1345    PT Time Calculation (min) 45 min    Activity Tolerance Patient tolerated treatment well    Behavior During Therapy [facility] for tasks assessed/performed              Past Medical History:  Diagnosis Date   Atrial fibrillation (HCC)    Basal cell carcinoma 07/23/2018   right cheekbone-[id number]   IBS (irritable bowel syndrome)    Obesity (BMI 30-39.9) 07/11/2015   OSA (obstructive sleep apnea) 01/17/2015   Severe with AHI 35.8/hr   Panic attacks    Urinary incontinence, nocturnal enuresis    Past Surgical History:  Procedure Laterality Date   CARDIOVERSION N/A 04/02/2017   Procedure: CARDIOVERSION;  Surgeon: Chilton Si, MD;  Location: [facility];  Service: Cardiovascular;  Laterality: N/A;   SKIN BIOPSY N/A 07/23/2018   mid upper back-moderate-0 tmt   TONSILLECTOMY     Patient Active Problem List   Diagnosis Date Noted   MCI (mild cognitive impairment) 12/21/2020   Difficulty with adaptive servo-ventilation (ASV) use 12/21/2020   Excessive daytime sleepiness 12/21/2020   Epistaxis 05/16/2020   Right-sided epistaxis    Anxiety 04/26/2019   Hyperlipidemia 04/26/2019   Intracerebral hemorrhage 10/24/2017   Paroxysmal atrial fibrillation (HCC) 05/06/2017   Intolerance of continuous positive airway pressure (CPAP) ventilation 05/06/2017   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   Persistent atrial fibrillation (HCC) 02/22/2017   Depression 02/20/2017   Chronic diastolic CHF (congestive heart failure) (HCC) 02/20/2017   Obstructive sleep apnea hypopnea, severe 06/12/2016   Obesity (BMI 30-39.9) 07/11/2015   OSA (obstructive  sleep apnea) 01/17/2015   HTN (hypertension) 01/17/2015    PCP: Shellia Cleverly, PA  REFERRING PROVIDER: Shellia Cleverly, PA  REFERRING DIAG: falls, unsteady gait  THERAPY DIAG:  Repeated falls  Muscle weakness (generalized)  Unsteadiness on feet  Difficulty in walking, not elsewhere classified  Rationale for Evaluation and Treatment: Rehabilitation  ONSET DATE: 11/05/22  SUBJECTIVE:   SUBJECTIVE STATEMENT: Patient reports no additional falls. She reports several people have commented that she is moving better PERTINENT HISTORY: Per referring provider order: Husband would like PT assessment due to falls and unsteadiness. PAIN:  Are you having pain? Patient reports some general achiness, worse in the morning.  PRECAUTIONS: Fall  RED FLAGS: None   WEIGHT BEARING RESTRICTIONS: No  FALLS:  Has patient fallen in last 6 months? Yes. Number of falls 3, seem random, no patterns.  LIVING ENVIRONMENT: Lives with: lives with their family Lives in: House/apartment Stairs: Yes: Internal: 14 steps; can reach both and External: 6 steps; on left going up Has following equipment at home: Single point cane  OCCUPATION: N/A- goes to exercise class twice/week.  PLOF: Independent, has shower, difficulty getting in and out.  PATIENT GOALS: Prevent additional falls, increase speed of movement.  NEXT MD VISIT: unknown  OBJECTIVE:  Note: Objective measures were completed at Evaluation unless otherwise noted.  DIAGNOSTIC FINDINGS: N/A  COGNITION: Overall cognitive status: Within functional limits for tasks assessed     SENSATION: WFL  EDEMA:  Patient denies edema  MUSCLE  LENGTH: Hamstrings: Right 60 deg; Left 60 deg  POSTURE: rounded shoulders, forward head, increased thoracic kyphosis, and flexed trunk   PALPATION: No TTP  LOWER EXTREMITY ROM: BLE ROM WNL  LOWER EXTREMITY MMT: B LE (3+)-4-/5 throughout, Trunk unstable and weak  FUNCTIONAL TESTS:  5 times sit to  stand: 20.72 used arm support and leaned back against mat to rise. Timed up and go (TUG): 19.06 Functional gait assessment: N/A  GAIT: Distance walked: In clinic distances Assistive device utilized: Single point cane Level of assistance: Modified independence Comments: Patient very unstable with increased swaying during gait, slow, step to/step through  TODAY'S TREATMENT:                                                                                                                              DATE:  01/01/23: Nustep L5 x 6 min Side stepping while holding 2# medibar, 3 x 8' Sit to stand from hi/lo mat on ground, then on airex,  Seated long arc quad with 3# cuff wt, 10 reps, 2 sets each., repeated cues to slow movement Seated hamstring curls green band, 15 x 2 sets Lateral step ups onto airex with CGA and cane, used gait belt 10 x each  In ll bars for  long steps over airex balance beam In ll bars for high marching Standing on airex for heel/toe rocks Gait 2 laps with 2# mediball with CGA from PT with gait belt  12/25/22 NuStep L5 x 6 min. Mini squats at mat, required max VC and occasional TC to perform correctly as she tended to just bend forward at hips. B side stepping while holding 2# WATE bar at chest height, 2 x 8 steps each way B side stepping up onto Airex pad and off, x 10 each direction, using SPC and HHA Seated long kicks against 3# weight, knee flexion against Red Tband, 15 reps each, ball squeeze between knees with long kicks x 10 each. Walking while holding 2# ball in BUE at chest height to engage trunk. Improved upright posture, step length, speed of gait noted. Performed stepping in all directions with direction changes upon therapist command while holding the ball at chest height, CGA, no unsteadiness noted.  12/23/22 NuStep L4 x 6 minutes Seated rotational reach out to side, x 10 each direction. Seated long kicks, 3#, 2 x 10 reps each leg Standing hip abd, ext  and march with 3# weights on each ankle, 10 reps each, with RW for UE support and balance. Seated shoulder ext, rows, ER with yellow Tband. Had difficulty holding appropriate form. 10 each with mod VC for posture and technique. B side to side step while holding 2# WATE bar in BUE at chest height, 15 reps to each side, VC for posture and form. 10 mini squats at mat Ambulation x 40' with 2# WATE bar held at chest height, CGA, but no unsteadiness noted.  12/18/22 Seated forward flexion over physioball to stretch  trunk and open ribs. NuStep L4 x 4 minutes Walking on black floormat, 4 x 6" with turns, RUE HHA, occasional light min A for balance Standing alternating taps on cone with side step, HHA Requested additional forward flexion over ball Standing at counter-Stride position with rotation x 5 each side, UUE support on counter Standing heel raises in stride position at counter B side step on air ex plank in parallel bars, BUE support, 3 x each way  12/16/22 NuStep L3 x 6 minutes March on air ex with [facility] and HHA x 10 each Standing at counter, NBOS-stand with No UE support and then with slight ant/post weight shifts, S, no LOB. Repeat with feet in stride position, then step back into rotation and reach back x 10 each. Standing B side to side step x 10 each way, SPC and CGA, no unsteadiness noted. Sit to stand from slightly elevated surface with 2# ball in BUE, OHP at top, x 10 reps Stp ups onto 4" step, 2 x 5 reps each side with BUE support  12/09/22:Therapeutic exercise:  Nustep L5, 6 min Ue's and LEs's Supine with 65 cm, green physioball for stretching and strengthening B hips: B knee to chest, 15x LTR's 15x Bridging, 15x Side stepping in ll bars on airex balance beam, 3 laps Standing in ll bars for heel raises  Sit to stand with B HHA from chair with pillow in seat 5 reps, 2 sets  Seated hamstring curls green band 15 x each  Therapeutic activity:  Floor transfers:  patient was  instructed/ assisted in seated to floor and floor to standing transfers.  She required use of gait belt and + 2 assist eventually to move from floor to standing from a seated position, unable to position in side sitting, 1/2 kneeling or B kneeling today, with max assist and use of mat. Showed/recommended to pt/ husband a bed rail to assist with transfers/ avoid falls   12/06/22 NuStep L4 x 6 minutes Supine stretch with physioball- DKTC, piriformis Supine strength with physioball- bridge, quick side to side rotations. Attempted mini squats, tended to bend over at hips only, difficulty performing accurately even with mod VC Quick side to side steps with BUE support x 10 to each side. Alternating step taps on 2" step with SPC, progressed to  multiple taps with each foot.  12/04/22: Therapeutic exercise: instructed the patient initially in therex to perform at home per list below:  Standing toe raises 10x Standing alt hip abd 10x Standing alt hip ext 10x Standing marching 10x Sit to stand from chair with pillow under hips to alter height: 5x 2  Also instructed pt in the performance of the following ex:  Seated long arc quads 2# 10x Seated hamstring curls green band  10x Seated hip abd/ER with green band 10x Nustep level 3, x 5 min Gait x 40' x 2 with st cane and SBA  11/25/22  Education   PATIENT EDUCATION:  Education details: POC Person educated: Patient and Spouse Education method: Explanation Education comprehension: verbalized understanding  HOME EXERCISE PROGRAM: TBD Access Code: [email or web address] URL: [email or web address] Date: 12/04/2022 Prepared by: Ronda Kazmi  Exercises - Standing Heel Raises  - 1 x daily - 7 x weekly - 3 sets - 10 reps - Standing Hip Abduction with Counter Support  - 1 x daily - 7 x weekly - 3 sets - 10 reps - Standing Hip Extension with Counter Support  - 1 x daily - 7 x weekly - 3 sets -  10 reps - Sit to Stand with Armchair  - 1 x daily -  7 x weekly - 3 sets - 10 reps - March in Place  - 1 x daily - 7 x weekly - 3 sets - 10 reps  ASSESSMENT:  CLINICAL IMPRESSION: Patient reports no additional falls. Continued to emphasize strength, motor engagement to build stability, and balance training.  She became more unsteady with cognitive challenges, needed redirection. Overall strength and endurance improving. Will be ready for reassessment next week for 10th visit progress note.  OBJECTIVE IMPAIRMENTS: Abnormal gait, decreased activity tolerance, decreased balance, difficulty walking, decreased ROM, decreased strength, and postural dysfunction.   ACTIVITY LIMITATIONS: carrying, lifting, bending, standing, squatting, stairs, and locomotion level  PARTICIPATION LIMITATIONS: meal prep, cleaning, laundry, driving, shopping, and community activity  PERSONAL FACTORS: Past/current experiences are also affecting patient's functional outcome.   REHAB POTENTIAL: Good  CLINICAL DECISION MAKING: Stable/uncomplicated  EVALUATION COMPLEXITY: Low   GOALS: Goals reviewed with patient? Yes  SHORT TERM GOALS: Target date: 12/10/22 I with initial HEP Baseline: Goal status: 12/16/22- met  LONG TERM GOALS: Target date: 02/03/23  I with final HEP Baseline:  Goal status: INITIAL  2.  Increase BLE strength to at least 4/5 Baseline:  Goal status: INITIAL  3.  Complete 5X STS in < 12 sec without compensation Baseline:  Goal status: INITIAL  4.  Complete TUG in < 12 sec Baseline:  Goal status: INITIAL  5.  Patient will ambulate x at least 300' with LRAD, MI with normalized gait pattern- no increased sway, no instability, including turns. Baseline:  Goal status: INITIAL  6.  Standing<> Floor transfers with S, using AD as needed. Baseline:  Goal status: INITIAL   PLAN:  PT FREQUENCY: 1-2x/week  PT DURATION: 10 weeks  PLANNED INTERVENTIONS: 97146- PT Re-evaluation, 97110-Therapeutic exercises, 97530- Therapeutic activity,  97112- Neuromuscular re-education, 97535- Self Care, 81829- Manual therapy, [phone]- Gait training, Patient/Family education, Balance training, and Stair training  PLAN FOR NEXT SESSION: review HEP, continue with endurance, strengthening ex LE and trunk  Emmett Bracknell, PT,DPT, OCS 01/01/23 2:55 PM

## 2023-01-06 ENCOUNTER — Ambulatory Visit: Payer: Medicare Other

## 2023-01-06 ENCOUNTER — Other Ambulatory Visit: Payer: Self-pay

## 2023-01-06 DIAGNOSIS — R262 Difficulty in walking, not elsewhere classified: Secondary | ICD-10-CM

## 2023-01-06 DIAGNOSIS — R296 Repeated falls: Secondary | ICD-10-CM | POA: Diagnosis not present

## 2023-01-06 DIAGNOSIS — R2681 Unsteadiness on feet: Secondary | ICD-10-CM

## 2023-01-06 DIAGNOSIS — M6281 Muscle weakness (generalized): Secondary | ICD-10-CM

## 2023-01-06 NOTE — Therapy (Signed)
OUTPATIENT PHYSICAL THERAPY LOWER EXTREMITY TREATMENT Progress Note Reporting Period 12/05/22 to 01/06/23  See note below for Objective Data and Assessment of Progress/Goals.      Patient Name: Beth Haynes MRN: [id number] DOB:[birth year], 77 y.o., female Today's Date: 01/06/2023  END OF SESSION:  PT End of Session - 01/06/23 1015     Visit Number 10    Date for PT Re-Evaluation 02/03/23    Progress Note Due on Visit 20    PT Start Time 1015    PT Stop Time 1059    PT Time Calculation (min) 44 min    Activity Tolerance Patient tolerated treatment well    Behavior During Therapy [facility] for tasks assessed/performed               Past Medical History:  Diagnosis Date   Atrial fibrillation (HCC)    Basal cell carcinoma 07/23/2018   right cheekbone-[id number]   IBS (irritable bowel syndrome)    Obesity (BMI 30-39.9) 07/11/2015   OSA (obstructive sleep apnea) 01/17/2015   Severe with AHI 35.8/hr   Panic attacks    Urinary incontinence, nocturnal enuresis    Past Surgical History:  Procedure Laterality Date   CARDIOVERSION N/A 04/02/2017   Procedure: CARDIOVERSION;  Surgeon: Chilton Si, MD;  Location: [facility] ENDOSCOPY;  Service: Cardiovascular;  Laterality: N/A;   SKIN BIOPSY N/A 07/23/2018   mid upper back-moderate-0 tmt   TONSILLECTOMY     Patient Active Problem List   Diagnosis Date Noted   MCI (mild cognitive impairment) 12/21/2020   Difficulty with adaptive servo-ventilation (ASV) use 12/21/2020   Excessive daytime sleepiness 12/21/2020   Epistaxis 05/16/2020   Right-sided epistaxis    Anxiety 04/26/2019   Hyperlipidemia 04/26/2019   Intracerebral hemorrhage 10/24/2017   Paroxysmal atrial fibrillation (HCC) 05/06/2017   Intolerance of continuous positive airway pressure (CPAP) ventilation 05/06/2017   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   Persistent atrial fibrillation (HCC) 02/22/2017   Depression 02/20/2017   Chronic diastolic CHF (congestive heart  failure) (HCC) 02/20/2017   Obstructive sleep apnea hypopnea, severe 06/12/2016   Obesity (BMI 30-39.9) 07/11/2015   OSA (obstructive sleep apnea) 01/17/2015   HTN (hypertension) 01/17/2015    PCP: Shellia Cleverly, PA  REFERRING PROVIDER: Shellia Cleverly, PA  REFERRING DIAG: falls, unsteady gait  THERAPY DIAG:  Repeated falls  Muscle weakness (generalized)  Unsteadiness on feet  Difficulty in walking, not elsewhere classified  Rationale for Evaluation and Treatment: Rehabilitation  ONSET DATE: 11/05/22  SUBJECTIVE:   SUBJECTIVE STATEMENT: Patient reports no additional falls. She reports several people have commented that she is moving better PERTINENT HISTORY: Per referring provider order: Husband would like PT assessment due to falls and unsteadiness. PAIN:  Are you having pain? Patient reports some general achiness, worse in the morning.  PRECAUTIONS: Fall  RED FLAGS: None   WEIGHT BEARING RESTRICTIONS: No  FALLS:  Has patient fallen in Hay[place] 6 months? Yes. Number of falls 3, seem random, no patterns.  LIVING ENVIRONMENT: Lives with: lives with their family Lives in: House/apartment Stairs: Yes: Internal: 14 steps; can reach both and External: 6 steps; on left going up Has following equipment at home: Single point cane  OCCUPATION: N/A- goes to exercise class twice/week.  PLOF: Independent, has shower, difficulty getting in and out.  PATIENT GOALS: Prevent additional falls, increase speed of movement.  NEXT MD VISIT: unknown  OBJECTIVE:  Note: Objective measures were completed at Evaluation unless otherwise noted.  DIAGNOSTIC FINDINGS: N/A  COGNITION: Overall  cognitive status: Within functional limits for tasks assessed     SENSATION: WFL  EDEMA:  Patient denies edema  MUSCLE LENGTH: Hamstrings: Right 60 deg; Left 60 deg  POSTURE: rounded shoulders, forward head, increased thoracic kyphosis, and flexed trunk   PALPATION: No TTP  LOWER  EXTREMITY ROM: BLE ROM WNL  LOWER EXTREMITY MMT: B LE (3+)-4-/5 throughout, Trunk unstable and weak  FUNCTIONAL TESTS:  5 times sit to stand: 20.72 used arm support and leaned back against mat to rise. Timed up and go (TUG): 19.06 Functional gait assessment: N/A  GAIT: Distance walked: In clinic distances Assistive device utilized: Single point cane Level of assistance: Modified independence Comments: Patient very unstable with increased swaying during gait, slow, step to/step through  TODAY'S TREATMENT:                                                                                                                              DATE:  01/06/23: Nustep L4, x 6 min Reassessed TUG, 5 x sit to stand, gait 300' Seated LAQ's 3#, 3 x 15 In ll bars for heel/toe rocks on airex Alt forward step ups onto airex in ll bars Lateral travelling over airex in ll bars Seated for green t band hamstring curls 15 x 2 sets     01/01/23: Nustep L5 x 6 min Side stepping while holding 2# medibar, 3 x 8' Sit to stand from hi/lo mat on ground, then on airex,  Seated long arc quad with 3# cuff wt, 10 reps, 2 sets each., repeated cues to slow movement Seated hamstring curls green band, 15 x 2 sets Lateral step ups onto airex with CGA and cane, used gait belt 10 x each  In ll bars for  long steps over airex balance beam In ll bars for high marching Standing on airex for heel/toe rocks Gait 2 laps with 2# mediball with CGA from PT with gait belt  12/25/22 NuStep L5 x 6 min. Mini squats at mat, required max VC and occasional TC to perform correctly as she tended to just bend forward at hips. B side stepping while holding 2# WATE bar at chest height, 2 x 8 steps each way B side stepping up onto Airex pad and off, x 10 each direction, using SPC and HHA Seated long kicks against 3# weight, knee flexion against Red Tband, 15 reps each, ball squeeze between knees with long kicks x 10 each. Walking while  holding 2# ball in BUE at chest height to engage trunk. Improved upright posture, step length, speed of gait noted. Performed stepping in all directions with direction changes upon therapist command while holding the ball at chest height, CGA, no unsteadiness noted.  12/23/22 NuStep L4 x 6 minutes Seated rotational reach out to side, x 10 each direction. Seated long kicks, 3#, 2 x 10 reps each leg Standing hip abd, ext and march with 3# weights on each ankle, 10 reps each, with RW  for UE support and balance. Seated shoulder ext, rows, ER with yellow Tband. Had difficulty holding appropriate form. 10 each with mod VC for posture and technique. B side to side step while holding 2# WATE bar in BUE at chest height, 15 reps to each side, VC for posture and form. 10 mini squats at mat Ambulation x 40' with 2# WATE bar held at chest height, CGA, but no unsteadiness noted.  12/18/22 Seated forward flexion over physioball to stretch trunk and open ribs. NuStep L4 x 4 minutes Walking on black floormat, 4 x 6" with turns, RUE HHA, occasional light min A for balance Standing alternating taps on cone with side step, HHA Requested additional forward flexion over ball Standing at counter-Stride position with rotation x 5 each side, UUE support on counter Standing heel raises in stride position at counter B side step on air ex plank in parallel bars, BUE support, 3 x each way  12/16/22 NuStep L3 x 6 minutes March on air ex with [facility] and HHA x 10 each Standing at counter, NBOS-stand with No UE support and then with slight ant/post weight shifts, S, no LOB. Repeat with feet in stride position, then step back into rotation and reach back x 10 each. Standing B side to side step x 10 each way, SPC and CGA, no unsteadiness noted. Sit to stand from slightly elevated surface with 2# ball in BUE, OHP at top, x 10 reps Stp ups onto 4" step, 2 x 5 reps each side with BUE support  12/09/22:Therapeutic exercise:   Nustep L5, 6 min Ue's and LEs's Supine with 65 cm, green physioball for stretching and strengthening B hips: B knee to chest, 15x LTR's 15x Bridging, 15x Side stepping in ll bars on airex balance beam, 3 laps Standing in ll bars for heel raises  Sit to stand with B HHA from chair with pillow in seat 5 reps, 2 sets  Seated hamstring curls green band 15 x each  Therapeutic activity:  Floor transfers:  patient was instructed/ assisted in seated to floor and floor to standing transfers.  She required use of gait belt and + 2 assist eventually to move from floor to standing from a seated position, unable to position in side sitting, 1/2 kneeling or B kneeling today, with max assist and use of mat. Showed/recommended to pt/ husband a bed rail to assist with transfers/ avoid falls   12/06/22 NuStep L4 x 6 minutes Supine stretch with physioball- DKTC, piriformis Supine strength with physioball- bridge, quick side to side rotations. Attempted mini squats, tended to bend over at hips only, difficulty performing accurately even with mod VC Quick side to side steps with BUE support x 10 to each side. Alternating step taps on 2" step with SPC, progressed to  multiple taps with each foot.  12/04/22: Therapeutic exercise: instructed the patient initially in therex to perform at home per list below:  Standing toe raises 10x Standing alt hip abd 10x Standing alt hip ext 10x Standing marching 10x Sit to stand from chair with pillow under hips to alter height: 5x 2  Also instructed pt in the performance of the following ex:  Seated long arc quads 2# 10x Seated hamstring curls green band  10x Seated hip abd/ER with green band 10x Nustep level 3, x 5 min Gait x 40' x 2 with st cane and SBA  11/25/22  Education   PATIENT EDUCATION:  Education details: POC Person educated: Patient and Spouse Education method: Explanation  Education comprehension: verbalized understanding  HOME EXERCISE  PROGRAM: TBD Access Code: [email or web address] URL: [email or web address] Date: 12/04/2022 Prepared by: Kataleia Quaranta  Exercises - Standing Heel Raises  - 1 x daily - 7 x weekly - 3 sets - 10 reps - Standing Hip Abduction with Counter Support  - 1 x daily - 7 x weekly - 3 sets - 10 reps - Standing Hip Extension with Counter Support  - 1 x daily - 7 x weekly - 3 sets - 10 reps - Sit to Stand with Armchair  - 1 x daily - 7 x weekly - 3 sets - 10 reps - March in Place  - 1 x daily - 7 x weekly - 3 sets - 10 reps  ASSESSMENT:  CLINICAL IMPRESSION: Patient reports no additional falls. Continued to emphasize strength, motor engagement to build stability, and balance training.  Reassessed today for 10th visit progress report.  She has made progress but not met her goals yet.  Needs frequent redirection through each session.  Needs more intensive hip strengthening.      OBJECTIVE IMPAIRMENTS: Abnormal gait, decreased activity tolerance, decreased balance, difficulty walking, decreased ROM, decreased strength, and postural dysfunction.   ACTIVITY LIMITATIONS: carrying, lifting, bending, standing, squatting, stairs, and locomotion level  PARTICIPATION LIMITATIONS: meal prep, cleaning, laundry, driving, shopping, and community activity  PERSONAL FACTORS: Past/current experiences are also affecting patient's functional outcome.   REHAB POTENTIAL: Good  CLINICAL DECISION MAKING: Stable/uncomplicated  EVALUATION COMPLEXITY: Low   GOALS: Goals reviewed with patient? Yes  SHORT TERM GOALS: Target date: 12/10/22 I with initial HEP Baseline: Goal status: 12/16/22- met  LONG TERM GOALS: Target date: 02/03/23  I with final HEP Baseline:  Goal status: 01/06/23:  not met, not sure of home follow through  2.  Increase BLE strength to at least 4/5 Baseline:  Goal status: 01/06/23: 4/5 R quads, hamstrings.  Hip 4-/5. L hip,hams, quads 4-/5  3.  Complete 5X STS in < 12 sec without  compensation Baseline:  Goal status: Progressing: 01/06/23: 24.20  4.  Complete TUG in < 12 sec Baseline:  Goal status: PROGRESSING: 01/06/23: 17.39 sec with st cane  5.  Patient will ambulate x at least 300' with LRAD, MI with normalized gait pattern- no increased sway, no instability, including turns. Baseline:  Goal status: 01/06/23 walked 280' with st cane, then requested HHA for 300'  6.  Standing<> Floor transfers with S, using AD as needed. Baseline:  Goal status: 01/06/23:  attempted 2 weeks ago and required +2 assist for floor to standing, will need to attempt again .   PLAN:  PT FREQUENCY: 1-2x/week  PT DURATION: 10 weeks  PLANNED INTERVENTIONS: 97146- PT Re-evaluation, 97110-Therapeutic exercises, 97530- Therapeutic activity, 97112- Neuromuscular re-education, 97535- Self Care, 91478- Manual therapy, [phone]- Gait training, Patient/Family education, Balance training, and Stair training  PLAN FOR NEXT SESSION: review HEP, continue with endurance, strengthening ex LE and trunk  Brennen Camper, PT,DPT, OCS 01/06/23 12:57 PM

## 2023-01-08 ENCOUNTER — Ambulatory Visit: Payer: Medicare Other

## 2023-01-08 ENCOUNTER — Other Ambulatory Visit: Payer: Self-pay

## 2023-01-08 DIAGNOSIS — R262 Difficulty in walking, not elsewhere classified: Secondary | ICD-10-CM

## 2023-01-08 DIAGNOSIS — R296 Repeated falls: Secondary | ICD-10-CM

## 2023-01-08 DIAGNOSIS — M6281 Muscle weakness (generalized): Secondary | ICD-10-CM

## 2023-01-08 DIAGNOSIS — R2681 Unsteadiness on feet: Secondary | ICD-10-CM

## 2023-01-08 NOTE — Therapy (Signed)
OUTPATIENT PHYSICAL THERAPY LOWER EXTREMITY TREATMENT    Patient Name: Beth Haynes MRN: [id number] DOB:Jun 15, 1945, 77 y.o., female Today's Date: 01/08/2023  END OF SESSION:  PT End of Session - 01/08/23 1013     Visit Number 11    Date for PT Re-Evaluation 02/03/23    Progress Note Due on Visit 20    PT Start Time 1014    PT Stop Time 1100    PT Time Calculation (min) 46 min    Activity Tolerance Patient tolerated treatment well    Behavior During Therapy [facility] for tasks assessed/performed                Past Medical History:  Diagnosis Date   Atrial fibrillation (HCC)    Basal cell carcinoma 07/23/2018   right cheekbone-[id number]   IBS (irritable bowel syndrome)    Obesity (BMI 30-39.9) 07/11/2015   OSA (obstructive sleep apnea) 01/17/2015   Severe with AHI 35.8/hr   Panic attacks    Urinary incontinence, nocturnal enuresis    Past Surgical History:  Procedure Laterality Date   CARDIOVERSION N/A 04/02/2017   Procedure: CARDIOVERSION;  Surgeon: Chilton Si, MD;  Location: [facility] ENDOSCOPY;  Service: Cardiovascular;  Laterality: N/A;   SKIN BIOPSY N/A 07/23/2018   mid upper back-moderate-0 tmt   TONSILLECTOMY     Patient Active Problem List   Diagnosis Date Noted   MCI (mild cognitive impairment) 12/21/2020   Difficulty with adaptive servo-ventilation (ASV) use 12/21/2020   Excessive daytime sleepiness 12/21/2020   Epistaxis 05/16/2020   Right-sided epistaxis    Anxiety 04/26/2019   Hyperlipidemia 04/26/2019   Intracerebral hemorrhage 10/24/2017   Paroxysmal atrial fibrillation (HCC) 05/06/2017   Intolerance of continuous positive airway pressure (CPAP) ventilation 05/06/2017   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   Persistent atrial fibrillation (HCC) 02/22/2017   Depression 02/20/2017   Chronic diastolic CHF (congestive heart failure) (HCC) 02/20/2017   Obstructive sleep apnea hypopnea, severe 06/12/2016   Obesity (BMI 30-39.9) 07/11/2015   OSA  (obstructive sleep apnea) 01/17/2015   HTN (hypertension) 01/17/2015    PCP: Shellia Cleverly, PA  REFERRING PROVIDER: Shellia Cleverly, PA  REFERRING DIAG: falls, unsteady gait  THERAPY DIAG:  Repeated falls  Muscle weakness (generalized)  Unsteadiness on feet  Difficulty in walking, not elsewhere classified  Rationale for Evaluation and Treatment: Rehabilitation  ONSET DATE: 11/05/22  SUBJECTIVE:   SUBJECTIVE STATEMENT: Patient reports no additional falls. She reports moving around better.  States she would like to have more arm strength PERTINENT HISTORY: Per referring provider order: Husband would like PT assessment due to falls and unsteadiness. PAIN:  Are you having pain? Patient reports some general achiness, worse in the morning.  PRECAUTIONS: Fall  RED FLAGS: None   WEIGHT BEARING RESTRICTIONS: No  FALLS:  Has patient fallen in last 6 months? Yes. Number of falls 3, seem random, no patterns.  LIVING ENVIRONMENT: Lives with: lives with their family Lives in: House/apartment Stairs: Yes: Internal: 14 steps; can reach both and External: 6 steps; on left going up Has following equipment at home: Single point cane  OCCUPATION: N/A- goes to exercise class twice/week.  PLOF: Independent, has shower, difficulty getting in and out.  PATIENT GOALS: Prevent additional falls, increase speed of movement.  NEXT MD VISIT: unknown  OBJECTIVE:  Note: Objective measures were completed at Evaluation unless otherwise noted.  DIAGNOSTIC FINDINGS: N/A  COGNITION: Overall cognitive status: Within functional limits for tasks assessed     SENSATION: [facility]  EDEMA:  Patient denies edema  MUSCLE LENGTH: Hamstrings: Right 60 deg; Left 60 deg  POSTURE: rounded shoulders, forward head, increased thoracic kyphosis, and flexed trunk   PALPATION: No TTP  LOWER EXTREMITY ROM: BLE ROM WNL  LOWER EXTREMITY MMT: B LE (3+)-4-/5 throughout, Trunk unstable and  weak  FUNCTIONAL TESTS:  5 times sit to stand: 20.72 used arm support and leaned back against mat to rise. Timed up and go (TUG): 19.06 Functional gait assessment: N/A  GAIT: Distance walked: In clinic distances Assistive device utilized: Single point cane Level of assistance: Modified independence Comments: Patient very unstable with increased swaying during gait, slow, step to/step through  TODAY'S TREATMENT:                                                                                                                              DATE:  01/08/23: Sit to stand with B HHA with green t band around thighs to cue proximal hip abd/ER 10x Nustep L5, 6 min Seated rows with red t band Seated alt shoulder punches 1# 10 x each side Long arc quads 3# 10 reps, 3 sets each Seated hamstring curls, red t band, 3 x 10 Side stepping while holding red therabar 4 x length of mat In ll bars for heel/toe rocks feet on airex In ll bars for high marching feet on airex In ll bars for alt toe taps red cone 15 x  Gait 2 laps holding red mediball, CGA Seated horizontal B shoulder abd with yellow band 15 x      01/06/23: Nustep L4, x 6 min Reassessed TUG, 5 x sit to stand, gait 300' Seated LAQ's 3#, 3 x 15 In ll bars for heel/toe rocks on airex Alt forward step ups onto airex in ll bars Lateral travelling over airex in ll bars Seated for green t band hamstring curls 15 x 2 sets      01/01/23: Nustep L5 x 6 min Side stepping while holding 2# medibar, 3 x 8' Sit to stand from hi/lo mat on ground, then on airex,  Seated long arc quad with 3# cuff wt, 10 reps, 2 sets each., repeated cues to slow movement Seated hamstring curls green band, 15 x 2 sets Lateral step ups onto airex with CGA and cane, used gait belt 10 x each  In ll bars for  long steps over airex balance beam In ll bars for high marching Standing on airex for heel/toe rocks Gait 2 laps with 2# mediball with CGA from PT with gait  belt   12/25/22 NuStep L5 x 6 min. Mini squats at mat, required max VC and occasional TC to perform correctly as she tended to just bend forward at hips. B side stepping while holding 2# WATE bar at chest height, 2 x 8 steps each way B side stepping up onto Airex pad and off, x 10 each direction, using SPC and HHA Seated long kicks against 3# weight,  knee flexion against Red Tband, 15 reps each, ball squeeze between knees with long kicks x 10 each. Walking while holding 2# ball in BUE at chest height to engage trunk. Improved upright posture, step length, speed of gait noted. Performed stepping in all directions with direction changes upon therapist command while holding the ball at chest height, CGA, no unsteadiness noted.  12/23/22 NuStep L4 x 6 minutes Seated rotational reach out to side, x 10 each direction. Seated long kicks, 3#, 2 x 10 reps each leg Standing hip abd, ext and march with 3# weights on each ankle, 10 reps each, with RW for UE support and balance. Seated shoulder ext, rows, ER with yellow Tband. Had difficulty holding appropriate form. 10 each with mod VC for posture and technique. B side to side step while holding 2# WATE bar in BUE at chest height, 15 reps to each side, VC for posture and form. 10 mini squats at mat Ambulation x 40' with 2# WATE bar held at chest height, CGA, but no unsteadiness noted.  12/18/22 Seated forward flexion over physioball to stretch trunk and open ribs. NuStep L4 x 4 minutes Walking on black floormat, 4 x 6" with turns, RUE HHA, occasional light min A for balance Standing alternating taps on cone with side step, HHA Requested additional forward flexion over ball Standing at counter-Stride position with rotation x 5 each side, UUE support on counter Standing heel raises in stride position at counter B side step on air ex plank in parallel bars, BUE support, 3 x each way  12/16/22 NuStep L3 x 6 minutes March on air ex with [facility] and HHA x  10 each Standing at counter, NBOS-stand with No UE support and then with slight ant/post weight shifts, S, no LOB. Repeat with feet in stride position, then step back into rotation and reach back x 10 each. Standing B side to side step x 10 each way, SPC and CGA, no unsteadiness noted. Sit to stand from slightly elevated surface with 2# ball in BUE, OHP at top, x 10 reps Stp ups onto 4" step, 2 x 5 reps each side with BUE support  12/09/22:Therapeutic exercise:  Nustep L5, 6 min Ue's and LEs's Supine with 65 cm, green physioball for stretching and strengthening B hips: B knee to chest, 15x LTR's 15x Bridging, 15x Side stepping in ll bars on airex balance beam, 3 laps Standing in ll bars for heel raises  Sit to stand with B HHA from chair with pillow in seat 5 reps, 2 sets  Seated hamstring curls green band 15 x each  Therapeutic activity:  Floor transfers:  patient was instructed/ assisted in seated to floor and floor to standing transfers.  She required use of gait belt and + 2 assist eventually to move from floor to standing from a seated position, unable to position in side sitting, 1/2 kneeling or B kneeling today, with max assist and use of mat. Showed/recommended to pt/ husband a bed rail to assist with transfers/ avoid falls   12/06/22 NuStep L4 x 6 minutes Supine stretch with physioball- DKTC, piriformis Supine strength with physioball- bridge, quick side to side rotations. Attempted mini squats, tended to bend over at hips only, difficulty performing accurately even with mod VC Quick side to side steps with BUE support x 10 to each side. Alternating step taps on 2" step with SPC, progressed to  multiple taps with each foot.  12/04/22: Therapeutic exercise: instructed the patient initially in therex to  perform at home per list below:  Standing toe raises 10x Standing alt hip abd 10x Standing alt hip ext 10x Standing marching 10x Sit to stand from chair with pillow under  hips to alter height: 5x 2  Also instructed pt in the performance of the following ex:  Seated long arc quads 2# 10x Seated hamstring curls green band  10x Seated hip abd/ER with green band 10x Nustep level 3, x 5 min Gait x 40' x 2 with st cane and SBA  11/25/22  Education   PATIENT EDUCATION:  Education details: POC Person educated: Patient and Spouse Education method: Explanation Education comprehension: verbalized understanding  HOME EXERCISE PROGRAM: TBD Access Code: [email or web address] URL: [email or web address] Date: 12/04/2022 Prepared by: Lin Hackmann  Exercises - Standing Heel Raises  - 1 x daily - 7 x weekly - 3 sets - 10 reps - Standing Hip Abduction with Counter Support  - 1 x daily - 7 x weekly - 3 sets - 10 reps - Standing Hip Extension with Counter Support  - 1 x daily - 7 x weekly - 3 sets - 10 reps - Sit to Stand with Armchair  - 1 x daily - 7 x weekly - 3 sets - 10 reps - March in Place  - 1 x daily - 7 x weekly - 3 sets - 10 reps  ASSESSMENT:  CLINICAL IMPRESSION: Patient reports no additional falls. Continued to emphasize strength, motor engagement to build stability, and balance training.   Needs frequent redirection through each session.  Weaving a lot with gait with out cane.  Continues to need more intensive hip strengthening.      OBJECTIVE IMPAIRMENTS: Abnormal gait, decreased activity tolerance, decreased balance, difficulty walking, decreased ROM, decreased strength, and postural dysfunction.   ACTIVITY LIMITATIONS: carrying, lifting, bending, standing, squatting, stairs, and locomotion level  PARTICIPATION LIMITATIONS: meal prep, cleaning, laundry, driving, shopping, and community activity  PERSONAL FACTORS: Past/current experiences are also affecting patient's functional outcome.   REHAB POTENTIAL: Good  CLINICAL DECISION MAKING: Stable/uncomplicated  EVALUATION COMPLEXITY: Low   GOALS: Goals reviewed with patient? Yes  SHORT  TERM GOALS: Target date: 12/10/22 I with initial HEP Baseline: Goal status: 12/16/22- met  LONG TERM GOALS: Target date: 02/03/23  I with final HEP Baseline:  Goal status: 01/06/23:  not met, not sure of home follow through  2.  Increase BLE strength to at least 4/5 Baseline:  Goal status: 01/06/23: 4/5 R quads, hamstrings.  Hip 4-/5. L hip,hams, quads 4-/5  3.  Complete 5X STS in < 12 sec without compensation Baseline:  Goal status: Progressing: 01/06/23: 24.20  4.  Complete TUG in < 12 sec Baseline:  Goal status: PROGRESSING: 01/06/23: 17.39 sec with st cane  5.  Patient will ambulate x at least 300' with LRAD, MI with normalized gait pattern- no increased sway, no instability, including turns. Baseline:  Goal status: 01/06/23 walked 280' with st cane, then requested HHA for 300'  6.  Standing<> Floor transfers with S, using AD as needed. Baseline:  Goal status: 01/06/23:  attempted 2 weeks ago and required +2 assist for floor to standing, will need to attempt again .   PLAN:  PT FREQUENCY: 1-2x/week  PT DURATION: 10 weeks  PLANNED INTERVENTIONS: 97146- PT Re-evaluation, 97110-Therapeutic exercises, 97530- Therapeutic activity, [id number]- Neuromuscular re-education, 97535- Self Care, [id number]- Manual therapy, [phone]- Gait training, Patient/Family education, Balance training, and Stair training  PLAN FOR NEXT SESSION: review HEP, continue with endurance, strengthening ex LE and trunk  Noris Kulinski, PT,DPT, OCS 01/08/23 11:47 AM

## 2023-01-15 ENCOUNTER — Ambulatory Visit: Payer: Medicare Other | Attending: General Practice | Admitting: Physical Therapy

## 2023-01-15 ENCOUNTER — Encounter: Payer: Self-pay | Admitting: Physical Therapy

## 2023-01-15 DIAGNOSIS — R262 Difficulty in walking, not elsewhere classified: Secondary | ICD-10-CM | POA: Insufficient documentation

## 2023-01-15 DIAGNOSIS — R2681 Unsteadiness on feet: Secondary | ICD-10-CM | POA: Diagnosis present

## 2023-01-15 DIAGNOSIS — M6281 Muscle weakness (generalized): Secondary | ICD-10-CM | POA: Diagnosis present

## 2023-01-15 DIAGNOSIS — R296 Repeated falls: Secondary | ICD-10-CM | POA: Insufficient documentation

## 2023-01-15 NOTE — Therapy (Signed)
OUTPATIENT PHYSICAL THERAPY LOWER EXTREMITY TREATMENT    Patient Name: Beth Haynes MRN: [id number] DOB:03/31/1945, 77 y.o., female Today's Date: 01/15/2023  END OF SESSION:  PT End of Session - 01/15/23 0931     Visit Number 12    Date for PT Re-Evaluation 02/03/23    PT Start Time 0930    PT Stop Time 1015    PT Time Calculation (min) 45 min    Activity Tolerance Patient tolerated treatment well    Behavior During Therapy [facility] for tasks assessed/performed                Past Medical History:  Diagnosis Date   Atrial fibrillation (HCC)    Basal cell carcinoma 07/23/2018   right cheekbone-[id number]   IBS (irritable bowel syndrome)    Obesity (BMI 30-39.9) 07/11/2015   OSA (obstructive sleep apnea) 01/17/2015   Severe with AHI 35.8/hr   Panic attacks    Urinary incontinence, nocturnal enuresis    Past Surgical History:  Procedure Laterality Date   CARDIOVERSION N/A 04/02/2017   Procedure: CARDIOVERSION;  Surgeon: Chilton Si, MD;  Location: [facility] ENDOSCOPY;  Service: Cardiovascular;  Laterality: N/A;   SKIN BIOPSY N/A 07/23/2018   mid upper back-moderate-0 tmt   TONSILLECTOMY     Patient Active Problem List   Diagnosis Date Noted   MCI (mild cognitive impairment) 12/21/2020   Difficulty with adaptive servo-ventilation (ASV) use 12/21/2020   Excessive daytime sleepiness 12/21/2020   Epistaxis 05/16/2020   Right-sided epistaxis    Anxiety 04/26/2019   Hyperlipidemia 04/26/2019   Intracerebral hemorrhage 10/24/2017   Paroxysmal atrial fibrillation (HCC) 05/06/2017   Intolerance of continuous positive airway pressure (CPAP) ventilation 05/06/2017   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   Persistent atrial fibrillation (HCC) 02/22/2017   Depression 02/20/2017   Chronic diastolic CHF (congestive heart failure) (HCC) 02/20/2017   Obstructive sleep apnea hypopnea, severe 06/12/2016   Obesity (BMI 30-39.9) 07/11/2015   OSA (obstructive sleep apnea) 01/17/2015    HTN (hypertension) 01/17/2015    PCP: Shellia Cleverly, PA  REFERRING PROVIDER: Shellia Cleverly, PA  REFERRING DIAG: falls, unsteady gait  THERAPY DIAG:  Repeated falls  Muscle weakness (generalized)  Unsteadiness on feet  Difficulty in walking, not elsewhere classified  Rationale for Evaluation and Treatment: Rehabilitation  ONSET DATE: 11/05/22  SUBJECTIVE:   SUBJECTIVE STATEMENT: Had a fall yesterday, tripped over a throw rug. No injuries, able to pull herself up on a stair rail PERTINENT HISTORY: Per referring provider order: Husband would like PT assessment due to falls and unsteadiness. PAIN:  Are you having pain? Patient reports some general achiness, worse in the morning.  PRECAUTIONS: Fall  RED FLAGS: None   WEIGHT BEARING RESTRICTIONS: No  FALLS:  Has patient fallen in last 6 months? Yes. Number of falls 3, seem random, no patterns.  LIVING ENVIRONMENT: Lives with: lives with their family Lives in: House/apartment Stairs: Yes: Internal: 14 steps; can reach both and External: 6 steps; on left going up Has following equipment at home: Single point cane  OCCUPATION: N/A- goes to exercise class twice/week.  PLOF: Independent, has shower, difficulty getting in and out.  PATIENT GOALS: Prevent additional falls, increase speed of movement.  NEXT MD VISIT: unknown  OBJECTIVE:  Note: Objective measures were completed at Evaluation unless otherwise noted.  DIAGNOSTIC FINDINGS: N/A  COGNITION: Overall cognitive status: Within functional limits for tasks assessed     SENSATION: WFL  EDEMA:  Patient denies edema  MUSCLE LENGTH: Hamstrings:  Right 60 deg; Left 60 deg  POSTURE: rounded shoulders, forward head, increased thoracic kyphosis, and flexed trunk   PALPATION: No TTP  LOWER EXTREMITY ROM: BLE ROM WNL  LOWER EXTREMITY MMT: B LE (3+)-4-/5 throughout, Trunk unstable and weak  FUNCTIONAL TESTS:  5 times sit to stand: 20.72 used arm support and  leaned back against mat to rise. Timed up and go (TUG): 19.06 Functional gait assessment: N/A  GAIT: Distance walked: In clinic distances Assistive device utilized: Single point cane Level of assistance: Modified independence Comments: Patient very unstable with increased swaying during gait, slow, step to/step through  TODAY'S TREATMENT:                                                                                                                              DATE: 01/15/23 S2S 2x5 ALT box taps 4in w/ SPC 2x5 each  Nustep L5 6 min HS curls 20lb 2x10  Leg Ext 5lb 2x10 Gait 2 laps no AD, pt tends to veer to her R  Side step at mat able Seated OHP 3lb for core 2x10   Seated Row green 2x10  01/08/23: Sit to stand with B HHA with green t band around thighs to cue proximal hip abd/ER 10x Nustep L5, 6 min Seated rows with red t band Seated alt shoulder punches 1# 10 x each side Long arc quads 3# 10 reps, 3 sets each Seated hamstring curls, red t band, 3 x 10 Side stepping while holding red therabar 4 x length of mat In ll bars for heel/toe rocks feet on airex In ll bars for high marching feet on airex In ll bars for alt toe taps red cone 15 x  Gait 2 laps holding red mediball, CGA Seated horizontal B shoulder abd with yellow band 15 x      01/06/23: Nustep L4, x 6 min Reassessed TUG, 5 x sit to stand, gait 300' Seated LAQ's 3#, 3 x 15 In ll bars for heel/toe rocks on airex Alt forward step ups onto airex in ll bars Lateral travelling over airex in ll bars Seated for green t band hamstring curls 15 x 2 sets      01/01/23: Nustep L5 x 6 min Side stepping while holding 2# medibar, 3 x 8' Sit to stand from hi/lo mat on ground, then on airex,  Seated long arc quad with 3# cuff wt, 10 reps, 2 sets each., repeated cues to slow movement Seated hamstring curls green band, 15 x 2 sets Lateral step ups onto airex with CGA and cane, used gait belt 10 x each  In ll bars for   long steps over airex balance beam In ll bars for high marching Standing on airex for heel/toe rocks Gait 2 laps with 2# mediball with CGA from PT with gait belt   12/25/22 NuStep L5 x 6 min. Mini squats at mat, required max VC and occasional TC to perform correctly as she tended  to just bend forward at hips. B side stepping while holding 2# WATE bar at chest height, 2 x 8 steps each way B side stepping up onto Airex pad and off, x 10 each direction, using SPC and HHA Seated long kicks against 3# weight, knee flexion against Red Tband, 15 reps each, ball squeeze between knees with long kicks x 10 each. Walking while holding 2# ball in BUE at chest height to engage trunk. Improved upright posture, step length, speed of gait noted. Performed stepping in all directions with direction changes upon therapist command while holding the ball at chest height, CGA, no unsteadiness noted.  12/23/22 NuStep L4 x 6 minutes Seated rotational reach out to side, x 10 each direction. Seated long kicks, 3#, 2 x 10 reps each leg Standing hip abd, ext and march with 3# weights on each ankle, 10 reps each, with RW for UE support and balance. Seated shoulder ext, rows, ER with yellow Tband. Had difficulty holding appropriate form. 10 each with mod VC for posture and technique. B side to side step while holding 2# WATE bar in BUE at chest height, 15 reps to each side, VC for posture and form. 10 mini squats at mat Ambulation x 40' with 2# WATE bar held at chest height, CGA, but no unsteadiness noted.  12/18/22 Seated forward flexion over physioball to stretch trunk and open ribs. NuStep L4 x 4 minutes Walking on black floormat, 4 x 6" with turns, RUE HHA, occasional light min A for balance Standing alternating taps on cone with side step, HHA Requested additional forward flexion over ball Standing at counter-Stride position with rotation x 5 each side, UUE support on counter Standing heel raises in stride  position at counter B side step on air ex plank in parallel bars, BUE support, 3 x each way  12/16/22 NuStep L3 x 6 minutes March on air ex with [facility] and HHA x 10 each Standing at counter, NBOS-stand with No UE support and then with slight ant/post weight shifts, S, no LOB. Repeat with feet in stride position, then step back into rotation and reach back x 10 each. Standing B side to side step x 10 each way, SPC and CGA, no unsteadiness noted. Sit to stand from slightly elevated surface with 2# ball in BUE, OHP at top, x 10 reps Stp ups onto 4" step, 2 x 5 reps each side with BUE support  12/09/22:Therapeutic exercise:  Nustep L5, 6 min Ue's and LEs's Supine with 65 cm, green physioball for stretching and strengthening B hips: B knee to chest, 15x LTR's 15x Bridging, 15x Side stepping in ll bars on airex balance beam, 3 laps Standing in ll bars for heel raises  Sit to stand with B HHA from chair with pillow in seat 5 reps, 2 sets  Seated hamstring curls green band 15 x each  Therapeutic activity:  Floor transfers:  patient was instructed/ assisted in seated to floor and floor to standing transfers.  She required use of gait belt and + 2 assist eventually to move from floor to standing from a seated position, unable to position in side sitting, 1/2 kneeling or B kneeling today, with max assist and use of mat. Showed/recommended to pt/ husband a bed rail to assist with transfers/ avoid falls    PATIENT EDUCATION:  Education details: POC Person educated: Patient and Spouse Education method: Explanation Education comprehension: verbalized understanding  HOME EXERCISE PROGRAM: TBD Access Code: [email or web address] URL: [email or web address] Date: 12/04/2022 Prepared by:  Amy Speaks  Exercises - Standing Heel Raises  - 1 x daily - 7 x weekly - 3 sets - 10 reps - Standing Hip Abduction with Counter Support  - 1 x daily - 7 x weekly - 3 sets - 10 reps - Standing Hip Extension with  Counter Support  - 1 x daily - 7 x weekly - 3 sets - 10 reps - Sit to Stand with Armchair  - 1 x daily - 7 x weekly - 3 sets - 10 reps - March in Place  - 1 x daily - 7 x weekly - 3 sets - 10 reps  ASSESSMENT:  CLINICAL IMPRESSION: Patient reports a fall yesterday tripping over a throw rug.  I did advise pt to remove all throw rugs it possible. Continued to emphasize strength, motor engagement to build stability, and balance training. Pt has a kyphotic posture requiting cues throughout session. Cue for full ROM needed with leg curls and extensions. Needs frequent redirection through each session.  Weaving a lot with gait with out cane.  Continues to need more intensive hip strengthening.      OBJECTIVE IMPAIRMENTS: Abnormal gait, decreased activity tolerance, decreased balance, difficulty walking, decreased ROM, decreased strength, and postural dysfunction.   ACTIVITY LIMITATIONS: carrying, lifting, bending, standing, squatting, stairs, and locomotion level  PARTICIPATION LIMITATIONS: meal prep, cleaning, laundry, driving, shopping, and community activity  PERSONAL FACTORS: Past/current experiences are also affecting patient's functional outcome.   REHAB POTENTIAL: Good  CLINICAL DECISION MAKING: Stable/uncomplicated  EVALUATION COMPLEXITY: Low   GOALS: Goals reviewed with patient? Yes  SHORT TERM GOALS: Target date: 12/10/22 I with initial HEP Baseline: Goal status: 12/16/22- met  LONG TERM GOALS: Target date: 02/03/23  I with final HEP Baseline:  Goal status: 01/06/23:  not met, not sure of home follow through  2.  Increase BLE strength to at least 4/5 Baseline:  Goal status: 01/06/23: 4/5 R quads, hamstrings.  Hip 4-/5. L hip,hams, quads 4-/5  3.  Complete 5X STS in < 12 sec without compensation Baseline:  Goal status: Progressing: 01/06/23: 24.20  4.  Complete TUG in < 12 sec Baseline:  Goal status: PROGRESSING: 01/06/23: 17.39 sec with st cane  5.  Patient will  ambulate x at least 300' with LRAD, MI with normalized gait pattern- no increased sway, no instability, including turns. Baseline:  Goal status: 01/06/23 walked 280' with st cane, then requested HHA for 300'  6.  Standing<> Floor transfers with S, using AD as needed. Baseline:  Goal status: 01/06/23:  attempted 2 weeks ago and required +2 assist for floor to standing, will need to attempt again .   PLAN:  PT FREQUENCY: 1-2x/week  PT DURATION: 10 weeks  PLANNED INTERVENTIONS: 97146- PT Re-evaluation, 97110-Therapeutic exercises, 97530- Therapeutic activity, 97112- Neuromuscular re-education, 97535- Self Care, [id number]- Manual therapy, [phone]- Gait training, Patient/Family education, Balance training, and Stair training  PLAN FOR NEXT SESSION: review HEP, continue with endurance, strengthening ex LE and trunk  Amy Speaks, PT,DPT, OCS 01/15/23 9:32 AM

## 2023-01-21 ENCOUNTER — Ambulatory Visit: Payer: Medicare Other | Admitting: Neurology

## 2023-01-21 ENCOUNTER — Encounter: Payer: Self-pay | Admitting: Neurology

## 2023-01-21 VITALS — BP 125/75 | HR 79 | Ht 62.0 in | Wt 153.2 lb

## 2023-01-21 DIAGNOSIS — I4819 Other persistent atrial fibrillation: Secondary | ICD-10-CM

## 2023-01-21 DIAGNOSIS — R040 Epistaxis: Secondary | ICD-10-CM | POA: Diagnosis not present

## 2023-01-21 DIAGNOSIS — G3184 Mild cognitive impairment, so stated: Secondary | ICD-10-CM

## 2023-01-21 NOTE — Progress Notes (Signed)
Provider:  Melvyn Novas, MD  Primary Care Physician:  Shellia Cleverly, PA [address] [place] [address]     Referring Provider: Deno Etienne [address] [place],  [place] [address]          Chief Complaint according to patient   Patient presents with:     Memory evaluation/ Follow up.            HISTORY OF PRESENT ILLNESS:  Beth Haynes is a 77 y.o. female patient who is here for revisit 01/21/2023 for  memory care- she tested today 28/ 30 points on MMSE !  She is  fluent, eloquent and engaged. A far cry from her depressed and demented appearance 2021-2022.  She is no longer excessively sleepy, she still has a risk of falling.  She presented then with sleep apnea and brain bleed.  She had epistaxis  on another anticoagulation and switched to eloquis.   Chief concern according to patient :  she is cheerful , she is proud to tell me that she could be an advertisment for aricept:     01/21/2023    2:05 PM 05/22/2022    9:53 AM 11/07/2021   10:30 AM 05/07/2021   10:47 AM  MMSE - Mini Mental State Exam  Orientation to time 3 5 3 3   Orientation to Place 5 5 5 5   Registration 3 3 3 3   Attention/ Calculation 3 5 5 5   Recall 3 3 3 3   Language- name 2 objects 2 2 2 2   Language- repeat 1 1 1 1   Language- follow 3 step command 3 3 3 3   Language- read & follow direction 1 1 1 1   Write a sentence 1 0 1 1  Copy design 0 1 1 1   Total score 28 29 28 28       visio-spatial problems, the copied image of interlocking pentagram- it doesn't interlock.  Clock face is distorted.     Referring Provider: Vesta Mixer, MD        Interval history: 11-07-2021: Beth Haynes is seen here today she is more alert and conversational than she was the last time, she also has tolerated Aricept and its generic form very well and feels that has helped her memory, she still struggles some with depression I think she is worried about her cognitive health she has lost some weight  which is positive overall, she is going swimming and exercising on a regular basis now ; she is a "silver sneaker".  Her husband is cooking healthy meals and low salt, low sugar, no artificial sweetener.  She lost over 30 pounds.     I also added a concern about her blood pressure 181/125 mmHg today we will repeat this at the end of the visit.  Her BMI is now 30 kg per metered square.               Rv on 05-07-2021:  Beth Haynes is seen today in a RV, 77 years old, and we see her today for cognitive concerns. Last visit was  tearful one and she was so upset about her husband.  28/30 points by MMSE today, with a peculiar loss of one point for year and one point consecutively for the date.  We discussed displaying a large calendar, and to have regular exercise. She already joined silver sneakers, has more endurance. She dreams more,  she hydrates better and she lost 6 pounds.  I recommended the MIND diet and we discussing to start Aricept.       This is a new consult  for Beth Haynes , following a 3 year hiatus, today is the 12-21-2020. The patient is seen here with her husband, who is concerned about her excessive daytime sleepiness.  Her husband is almost deaf, but reports he could feel her pauses in breathing, when she is on her v back. He also wanted her memory to be evaluated.  He is worried about her memory loss.  She is reportedly having difficulties with the TV control. She herself has no concern about her memory: She has atrial fibrillation. In 2019 had a fall with brain hemarrhage.  She sleeps all the time- Epworth 22/ 24 points.  No data on her ASV.   Mr. Genzer  has made arrangements for a private trainer and buys her multivitamins.  She was admitted in April 2022 ; ED visit; depression/anxiety, chronic diastolic CHF, hyperlipidemia presented to [facility] P4/5 with right-sided epistaxis.  Multiple attempts to control bleeding failed including cautery and packing and transferred to [facility] seen by ENT Epistat was placed and patient also had A. fib with RVR and hypotensive needing Cardizem drip and ICU admission. Her hemoglobin remained stable overall although some drop.  Monitor in ICU, on 4/8 nasal packing was removed.  Patient appears fairly ill frail needing PT eval blood pressure soft so discharge was held and transferred to [facility] service. Patient has been lethargic sleepy sometimes confused some- BP has been soft, and labs showed AKI AKI was given iv boluses with improvement in her symptoms Monitored after starting eliquis on 4/11 and did well no nose bleeding and seen thsi am and is stable for discharge home, anxious to go home soon   Discharge Diagnoses:  Right-sided severe epistaxis in the setting of anticoagulation.  Epistal removed  By ENT 4/8. ENT advised to hold patient's Xarelto for couple of days-I have discussed with her cardiologist Dr. Elease Hashimoto- recommended to switch to Eliquis for less bleeding risk -started 4/11-monitored overnight to make sure patient has no recurrence of nasal epistaxis.  Hemoglobin overall stable although mild acute blood loss anemia currently at 2.3 g.     Chronic A. fib with RVR: Rate currently controlled, patient has been hypotensive/soft BP-that improved with IV fluid bolus and after stopping losartan.  Cont on her Cardizem, metoprolol ( increased to 100 mg) .There is question whether patient was taking her medication appropriately given that she presented with hypertension and uncontrolled RVR-since starting her meds blood pressure has been soft now.  Xarelto on hold 2/2 severe epistaxis-I have discussed with her cardiologist Dr. Elease Hashimoto- recommended switching to Eliquis.   Hypotension/soft blood pressure:s/p IV fluids and off losartan.  Stable, continue Cardizem, metoprolol for A. Fib and dose was increased to 100 mg. .   Lethargy/possible acute metabolic encephalopathy in the setting of soft blood pressure hypotension dehydration: This has  resolved with improvement in blood pressure and with IV hydration.ABG showed no CO2 retention.     Review of Systems: Out of a complete 14 system review, the patient complains of only the following symptoms, and all other reviewed systems are negative.:  Fatigue, sleepiness , snoring, fragmented sleep, Insomnia, RLS, Nocturia    How likely are you to doze in the following situations: 0 = not likely, 1 = slight chance, 2 = moderate chance, 3 = high chance   Sitting  and Reading? Watching Television? Sitting inactive in a public place (theater or meeting)? As a passenger in a car for an hour without a break? Lying down in the afternoon when circumstances permit? Sitting and talking to someone? Sitting quietly after lunch without alcohol? In a car, while stopped for a few minutes in traffic?   Total = 9/ 24 points   FSS endorsed at 34/ 63 points.   Social History   Socioeconomic History   Marital status: Married    Spouse name: Fayrene Fearing   Number of children: Not on file   Years of education: Not on file   Highest education level: Not on file  Occupational History   Not on file  Tobacco Use   Smoking status: Never   Smokeless tobacco: Never  Vaping Use   Vaping status: Never Used  Substance and Sexual Activity   Alcohol use: No   Drug use: No   Sexual activity: Not on file  Other Topics Concern   Not on file  Social History Narrative   Lives with husband   Right handed   Caffeine: none   Social Determinants of Health   Financial Resource Strain: Not on file  Food Insecurity: Not on file  Transportation Needs: Not on file  Physical Activity: Not on file  Stress: Not on file  Social Connections: Not on file    Family History  Problem Relation Age of Onset   Heart failure Mother    Heart attack Father    Hypertension Maternal Grandmother    Heart attack Paternal Grandfather    Breast cancer Maternal Aunt     Past Medical History:  Diagnosis Date   Atrial  fibrillation (HCC)    Basal cell carcinoma 07/23/2018   right cheekbone-[id number]   IBS (irritable bowel syndrome)    Obesity (BMI 30-39.9) 07/11/2015   OSA (obstructive sleep apnea) 01/17/2015   Severe with AHI 35.8/hr   Panic attacks    Urinary incontinence, nocturnal enuresis     Past Surgical History:  Procedure Laterality Date   CARDIOVERSION N/A 04/02/2017   Procedure: CARDIOVERSION;  Surgeon: Chilton Si, MD;  Location: [facility] ENDOSCOPY;  Service: Cardiovascular;  Laterality: N/A;   SKIN BIOPSY N/A 07/23/2018   mid upper back-moderate-0 tmt   TONSILLECTOMY       Current Outpatient Medications on File Prior to Visit  Medication Sig Dispense Refill   budesonide (ENTOCORT EC) 3 MG 24 hr capsule Take 9 mg by mouth as needed.     Cholecalciferol 1.25 MG (50000 UT) TABS Take 50,000 Units by mouth every Tuesday.     diltiazem (CARDIZEM CD) 360 MG 24 hr capsule TAKE 1 CAPSULE BY MOUTH EVERY DAY (Patient taking differently: 180 mg. TAKE 1 CAPSULE BY MOUTH EVERY DAY) 90 capsule 3   donepezil (ARICEPT) 10 MG tablet TAKE 1 TABLET BY MOUTH EVERYDAY AT BEDTIME 30 tablet 6   ELIQUIS 5 MG TABS tablet TAKE 1 TABLET(5 MG) BY MOUTH TWICE DAILY 180 tablet 1   metoprolol succinate (TOPROL-XL) 100 MG 24 hr tablet TAKE 1 TABLET(100 MG) BY MOUTH DAILY WITH OR IMMEDIATELY FOLLOWING A MEAL 90 tablet 3   diltiazem (CARDIZEM CD) 180 MG 24 hr capsule Take 1 capsule (180 mg total) by mouth daily. 90 capsule 3   No current facility-administered medications on file prior to visit.    Allergies  Allergen Reactions   Sulfa Antibiotics Other (See Comments)    Childhood allergy    Codeine     Other  reaction(s): Unknown   Epinephrine Other (See Comments)    Panic attacks      DIAGNOSTIC DATA (LABS, IMAGING, TESTING) - I reviewed patient records, labs, notes, testing and imaging myself where available.  Lab Results  Component Value Date   WBC 4.5 05/22/2020   HGB 12.3 05/22/2020   HCT 37.0 05/22/2020    MCV 92.3 05/22/2020   PLT 215 05/22/2020      Component Value Date/Time   NA 142 12/21/2020 1138   K 3.9 12/21/2020 1138   CL 104 12/21/2020 1138   CO2 27 12/21/2020 1138   GLUCOSE 91 12/21/2020 1138   GLUCOSE 115 (H) 05/22/2020 0411   BUN 12 12/21/2020 1138   CREATININE 0.87 12/21/2020 1138   CREATININE 0.64 03/13/2015 1141   CALCIUM 10.3 12/21/2020 1138   PROT 6.1 12/21/2020 1138   ALBUMIN 4.2 12/21/2020 1138   AST 12 12/21/2020 1138   ALT 16 12/21/2020 1138   ALKPHOS 82 12/21/2020 1138   BILITOT 0.7 12/21/2020 1138   GFRNONAA >60 05/22/2020 0411   GFRAA >60 10/24/2017 1138   Lab Results  Component Value Date   CHOL 127 02/21/2017   HDL 37 (L) 02/21/2017   LDLCALC 77 02/21/2017   TRIG 66 02/21/2017   CHOLHDL 3.4 02/21/2017   Lab Results  Component Value Date   HGBA1C 5.2 02/21/2017   Lab Results  Component Value Date   VITAMINB12 325 02/21/2017   Lab Results  Component Value Date   TSH 2.112 02/21/2017    PHYSICAL EXAM:  Today's Vitals   01/21/23 1355  BP: 125/75  Pulse: 79  Weight: 153 lb 3.2 oz (69.5 kg)  Height: 5\' 2"  (1.575 m)   Body mass index is 28.02 kg/m.   Wt Readings from Last 3 Encounters:  01/21/23 153 lb 3.2 oz (69.5 kg)  08/13/22 162 lb 9.6 oz (73.8 kg)  05/22/22 164 lb (74.4 kg)     Ht Readings from Last 3 Encounters:  01/21/23 5\' 2"  (1.575 m)  08/13/22 5\' 3"  (1.6 m)  05/22/22 5\' 3"  (1.6 m)      General: The patient is awake, alert and appears not in acute distress. The patient is well groomed. Head: Normocephalic, atraumatic. Neck is supple. Mallampati 2,  neck circumference:13.5 inches . Nasal airflow fully patent.  Retrognathia is not seen.  Dental status: biological  Cardiovascular:  Regular rate and cardiac rhythm by pulse,  without distended neck veins. Respiratory: Lungs are clear to auscultation.  Skin:  Without evidence of ankle edema, or rash. Trunk: The patient's posture is erect.   NEUROLOGIC EXAM: The  patient is awake and alert, oriented to place and time.   Memory subjective described as intact.   See MMSE 28 / 30  Attention span & concentration ability appears normal.  Speech is fluent,  without  dysarthria, dysphonia or aphasia.  Mood and affect are appropriate.   Cranial nerves: no loss of smell or taste reported  Pupils are equal and briskly reactive to light. Funduscopic exam deferred .  Extraocular movements in vertical and horizontal planes were intact and without nystagmus. No Diplopia. Visual fields by finger perimetry are intact. Hearing was intact to soft voice and finger rubbing.    Facial sensation intact to fine touch.  Facial motor strength is symmetric and tongue and uvula move midline.  Neck ROM : rotation, tilt and flexion extension were normal for age and shoulder shrug was symmetrical.    Motor exam:  Symmetric bulk, tone  and ROM.   Normal tone without cog wheeling, symmetric grip strength .   Sensory:  Fine touch normal.  Proprioception tested in the upper extremities was normal.   Coordination: Rapid alternating movements in the fingers/hands were of normal speed.  The Finger-to-nose maneuver was intact without evidence of ataxia, dysmetria but with action  tremor.   Gait and station: Patient could rise unassisted from a seated position, walked without assistive device.  Stance is of normal width/ base  Toe and heel walk were deferred.  Deep tendon reflexes: in the  upper and lower extremities are symmetric and intact.  Babinski response was deferred    ASSESSMENT AND PLAN 77 y.o. 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   2) no recent nosebleeds, no confusional spells, no hypersomnia.   3) vascular impairment of cognition, visio spatial.     I plan to follow up either personally or through our NP within 12 months.   I  would like to thank Shellia Cleverly, PA and Shellia Cleverly, [place] [address] [place] [employer],  [place] [address] for allowing me to meet with and to take care of this pleasant patient.    After spending a total time of  30  minutes face to face and additional time for physical and neurologic examination, review of laboratory studies,  personal review of imaging studies, reports and results of other testing and review of referral information / records as far as provided in visit,   Electronically signed by: Melvyn Novas, MD 01/21/2023 2:31 PM  [facility] Board certified by The [employer] of Sleep Medicine and Diplomate of the [employer] of [employer] Medicine. Board certified In Neurology through the ABPN, Fellow of the [employer] of Neu[employer].

## 2023-01-22 ENCOUNTER — Ambulatory Visit: Payer: Medicare Other | Admitting: Physical Therapy

## 2023-01-22 ENCOUNTER — Encounter: Payer: Self-pay | Admitting: Physical Therapy

## 2023-01-22 DIAGNOSIS — R2681 Unsteadiness on feet: Secondary | ICD-10-CM

## 2023-01-22 DIAGNOSIS — R262 Difficulty in walking, not elsewhere classified: Secondary | ICD-10-CM

## 2023-01-22 DIAGNOSIS — M6281 Muscle weakness (generalized): Secondary | ICD-10-CM

## 2023-01-22 DIAGNOSIS — R296 Repeated falls: Secondary | ICD-10-CM | POA: Diagnosis not present

## 2023-01-22 NOTE — Therapy (Addendum)
OUTPATIENT PHYSICAL THERAPY LOWER EXTREMITY TREATMENT  PHYSICAL THERAPY DISCHARGE SUMMARY  Visits from Start of Care: 3  Current functional level related to goals / functional outcomes: Goals partially met   Remaining deficits: Kyphotic posture unsteadiness in gait without AD HEP   Patient agrees to discharge. Patient goals were partially met. Patient is being discharged due to being pleased with the current functional level.   Patient Name: Beth Haynes MRN: [id number] DOB:Apr 12, 1945, 77 y.o., female Today's Date: 01/22/2023  END OF SESSION:  PT End of Session - 01/22/23 0933     Visit Number 13    Date for PT Re-Evaluation 02/03/23    PT Start Time 0930    PT Stop Time 1015    PT Time Calculation (min) 45 min    Activity Tolerance Patient tolerated treatment well    Behavior During Therapy [facility] for tasks assessed/performed                Past Medical History:  Diagnosis Date   Atrial fibrillation (HCC)    Basal cell carcinoma 07/23/2018   right cheekbone-[id number]   IBS (irritable bowel syndrome)    Obesity (BMI 30-39.9) 07/11/2015   OSA (obstructive sleep apnea) 01/17/2015   Severe with AHI 35.8/hr   Panic attacks    Urinary incontinence, nocturnal enuresis    Past Surgical History:  Procedure Laterality Date   CARDIOVERSION N/A 04/02/2017   Procedure: CARDIOVERSION;  Surgeon: Chilton Si, MD;  Location: [facility] ENDOSCOPY;  Service: Cardiovascular;  Laterality: N/A;   SKIN BIOPSY N/A 07/23/2018   mid upper back-moderate-0 tmt   TONSILLECTOMY     Patient Active Problem List   Diagnosis Date Noted   MCI (mild cognitive impairment) 12/21/2020   Difficulty with adaptive servo-ventilation (ASV) use 12/21/2020   Excessive daytime sleepiness 12/21/2020   Epistaxis 05/16/2020   Right-sided epistaxis    Anxiety 04/26/2019   Hyperlipidemia 04/26/2019   Intracerebral hemorrhage 10/24/2017   Paroxysmal atrial fibrillation (HCC) 05/06/2017   Intolerance of  continuous positive airway pressure (CPAP) ventilation 05/06/2017   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   Persistent atrial fibrillation (HCC) 02/22/2017   Depression 02/20/2017   Chronic diastolic CHF (congestive heart failure) (HCC) 02/20/2017   Obstructive sleep apnea hypopnea, severe 06/12/2016   Obesity (BMI 30-39.9) 07/11/2015   OSA (obstructive sleep apnea) 01/17/2015   HTN (hypertension) 01/17/2015    PCP: Shellia Cleverly, PA  REFERRING PROVIDER: Shellia Cleverly, PA  REFERRING DIAG: falls, unsteady gait  THERAPY DIAG:  Repeated falls  Unsteadiness on feet  Muscle weakness (generalized)  Difficulty in walking, not elsewhere classified  Rationale for Evaluation and Treatment: Rehabilitation  ONSET DATE: 11/05/22  SUBJECTIVE:   SUBJECTIVE STATEMENT: "I have not fallen in about two weeks and I recovered from it" PERTINENT HISTORY: Per referring provider order: Husband would like PT assessment due to falls and unsteadiness. PAIN:  Are you having pain? 0/10 pain  PRECAUTIONS: Fall  RED FLAGS: None   WEIGHT BEARING RESTRICTIONS: No  FALLS:  Has patient fallen in last 6 months? Yes. Number of falls 3, seem random, no patterns.  LIVING ENVIRONMENT: Lives with: lives with their family Lives in: House/apartment Stairs: Yes: Internal: 14 steps; can reach both and External: 6 steps; on left going up Has following equipment at home: Single point cane  OCCUPATION: N/A- goes to exercise class twice/week.  PLOF: Independent, has shower, difficulty getting in and out.  PATIENT GOALS: Prevent additional falls, increase speed of movement.  NEXT MD  VISIT: unknown  OBJECTIVE:  Note: Objective measures were completed at Evaluation unless otherwise noted.  DIAGNOSTIC FINDINGS: N/A  COGNITION: Overall cognitive status: Within functional limits for tasks assessed     SENSATION: WFL  EDEMA:  Patient denies edema  MUSCLE LENGTH: Hamstrings: Right 60 deg; Left  60 deg  POSTURE: rounded shoulders, forward head, increased thoracic kyphosis, and flexed trunk   PALPATION: No TTP  LOWER EXTREMITY ROM: BLE ROM WNL  LOWER EXTREMITY MMT: B LE (3+)-4-/5 throughout, Trunk unstable and weak  FUNCTIONAL TESTS:  5 times sit to stand: 20.72 used arm support and leaned back against mat to rise. Timed up and go (TUG): 19.06 Functional gait assessment: N/A  GAIT: Distance walked: In clinic distances Assistive device utilized: Single point cane Level of assistance: Modified independence Comments: Patient very unstable with increased swaying during gait, slow, step to/step through  TODAY'S TREATMENT:                                                                                                                              DATE: 01/22/23 NuStep L5 x 6 min 5 times sit to stand 16.37 sec Timed up and go 18.36 HS curls 20lb 2x10 Alt 4in box taps w/ SPC 2x10 total Standing shoulder Ext red 2x10 Side step at mat able   01/15/23 S2S 2x5 ALT box taps 4in w/ SPC 2x5 each  Nustep L5 6 min HS curls 20lb 2x10  Leg Ext 5lb 2x10 Gait 2 laps no AD, pt tends to veer to her R  Side step at mat able Seated OHP 3lb for core 2x10   Seated Row green 2x10  01/08/23: Sit to stand with B HHA with green t band around thighs to cue proximal hip abd/ER 10x Nustep L5, 6 min Seated rows with red t band Seated alt shoulder punches 1# 10 x each side Long arc quads 3# 10 reps, 3 sets each Seated hamstring curls, red t band, 3 x 10 Side stepping while holding red therabar 4 x length of mat In ll bars for heel/toe rocks feet on airex In ll bars for high marching feet on airex In ll bars for alt toe taps red cone 15 x  Gait 2 laps holding red mediball, CGA Seated horizontal B shoulder abd with yellow band 15 x      01/06/23: Nustep L4, x 6 min Reassessed TUG, 5 x sit to stand, gait 300' Seated LAQ's 3#, 3 x 15 In ll bars for heel/toe rocks on airex Alt forward  step ups onto airex in ll bars Lateral travelling over airex in ll bars Seated for green t band hamstring curls 15 x 2 sets      01/01/23: Nustep L5 x 6 min Side stepping while holding 2# medibar, 3 x 8' Sit to stand from hi/lo mat on ground, then on airex,  Seated long arc quad with 3# cuff wt, 10 reps, 2 sets each., repeated cues  to slow movement Seated hamstring curls green band, 15 x 2 sets Lateral step ups onto airex with CGA and cane, used gait belt 10 x each  In ll bars for  long steps over airex balance beam In ll bars for high marching Standing on airex for heel/toe rocks Gait 2 laps with 2# mediball with CGA from PT with gait belt   12/25/22 NuStep L5 x 6 min. Mini squats at mat, required max VC and occasional TC to perform correctly as she tended to just bend forward at hips. B side stepping while holding 2# WATE bar at chest height, 2 x 8 steps each way B side stepping up onto Airex pad and off, x 10 each direction, using SPC and HHA Seated long kicks against 3# weight, knee flexion against Red Tband, 15 reps each, ball squeeze between knees with long kicks x 10 each. Walking while holding 2# ball in BUE at chest height to engage trunk. Improved upright posture, step length, speed of gait noted. Performed stepping in all directions with direction changes upon therapist command while holding the ball at chest height, CGA, no unsteadiness noted.  12/23/22 NuStep L4 x 6 minutes Seated rotational reach out to side, x 10 each direction. Seated long kicks, 3#, 2 x 10 reps each leg Standing hip abd, ext and march with 3# weights on each ankle, 10 reps each, with RW for UE support and balance. Seated shoulder ext, rows, ER with yellow Tband. Had difficulty holding appropriate form. 10 each with mod VC for posture and technique. B side to side step while holding 2# WATE bar in BUE at chest height, 15 reps to each side, VC for posture and form. 10 mini squats at mat Ambulation  x 40' with 2# WATE bar held at chest height, CGA, but no unsteadiness noted.  12/18/22 Seated forward flexion over physioball to stretch trunk and open ribs. NuStep L4 x 4 minutes Walking on black floormat, 4 x 6" with turns, RUE HHA, occasional light min A for balance Standing alternating taps on cone with side step, HHA Requested additional forward flexion over ball Standing at counter-Stride position with rotation x 5 each side, UUE support on counter Standing heel raises in stride position at counter B side step on air ex plank in parallel bars, BUE support, 3 x each way  12/16/22 NuStep L3 x 6 minutes March on air ex with [facility] and HHA x 10 each Standing at counter, NBOS-stand with No UE support and then with slight ant/post weight shifts, S, no LOB. Repeat with feet in stride position, then step back into rotation and reach back x 10 each. Standing B side to side step x 10 each way, SPC and CGA, no unsteadiness noted. Sit to stand from slightly elevated surface with 2# ball in BUE, OHP at top, x 10 reps Stp ups onto 4" step, 2 x 5 reps each side with BUE support  12/09/22:Therapeutic exercise:  Nustep L5, 6 min Ue's and LEs's Supine with 65 cm, green physioball for stretching and strengthening B hips: B knee to chest, 15x LTR's 15x Bridging, 15x Side stepping in ll bars on airex balance beam, 3 laps Standing in ll bars for heel raises  Sit to stand with B HHA from chair with pillow in seat 5 reps, 2 sets  Seated hamstring curls green band 15 x each  Therapeutic activity:  Floor transfers:  patient was instructed/ assisted in seated to floor and floor to standing transfers.  She required use of gait belt and + 2 assist eventually to move from floor to standing from a seated position, unable to position in side sitting, 1/2 kneeling or B kneeling today, with max assist and use of mat. Showed/recommended to pt/ husband a bed rail to assist with transfers/ avoid falls    PATIENT  EDUCATION:  Education details: POC Person educated: Patient and Spouse Education method: Explanation Education comprehension: verbalized understanding  HOME EXERCISE PROGRAM: TBD Access Code: [email or web address] URL: [email or web address] Date: 12/04/2022 Prepared by: Amy Speaks  Exercises - Standing Heel Raises  - 1 x daily - 7 x weekly - 3 sets - 10 reps - Standing Hip Abduction with Counter Support  - 1 x daily - 7 x weekly - 3 sets - 10 reps - Standing Hip Extension with Counter Support  - 1 x daily - 7 x weekly - 3 sets - 10 reps - Sit to Stand with Armchair  - 1 x daily - 7 x weekly - 3 sets - 10 reps - March in Place  - 1 x daily - 7 x weekly - 3 sets - 10 reps  ASSESSMENT:  CLINICAL IMPRESSION: Pt enters doing well She has progressed decreasing her 5 times sit to stands time, but it did take her slightly lonfer to coplete her TUG. This could be due to environmental factors such as distractions. Continued to emphasize strength, motor engagement to build stability, and balance training. Pt has a kyphotic posture requiting cues throughout session. Cue for full ROM needed with leg curls and extensions. Needs frequent redirection through each session.  Weaving a lot with gait with out cane.      OBJECTIVE IMPAIRMENTS: Abnormal gait, decreased activity tolerance, decreased balance, difficulty walking, decreased ROM, decreased strength, and postural dysfunction.   ACTIVITY LIMITATIONS: carrying, lifting, bending, standing, squatting, stairs, and locomotion level  PARTICIPATION LIMITATIONS: meal prep, cleaning, laundry, driving, shopping, and community activity  PERSONAL FACTORS: Past/current experiences are also affecting patient's functional outcome.   REHAB POTENTIAL: Good  CLINICAL DECISION MAKING: Stable/uncomplicated  EVALUATION COMPLEXITY: Low   GOALS: Goals reviewed with patient? Yes  SHORT TERM GOALS: Target date: 12/10/22 I with initial HEP Baseline: Goal  status: 12/16/22- met  LONG TERM GOALS: Target date: 02/03/23  I with final HEP Baseline:  Goal status: 01/22/23: met  2.  Increase BLE strength to at least 4/5 Baseline:  Goal status: 01/22/23: met  3.  Complete 5X STS in < 12 sec without compensation Baseline:  Goal status: Progressing: 01/06/23: 24.20, Progressing 01/22/23 16.37  4.  Complete TUG in < 12 sec Baseline:  Goal status: PROGRESSING: 01/06/23: 17.39 sec with st cane,   5.  Patient will ambulate x at least 300' with LRAD, MI with normalized gait pattern- no increased sway, no instability, including turns. Baseline:  Goal status: 12/11/24met  6.  Standing<> Floor transfers with S, using AD as needed. Baseline:  Goal status: 01/06/23:  attempted 2 weeks ago and required +2 assist for floor to standing, will need to attempt again . No met 01/22/23   PLAN:  PT FREQUENCY: 1-2x/week  PT DURATION: 10 weeks  PLANNED INTERVENTIONS: 97146- PT Re-evaluation, 97110-Therapeutic exercises, 97530- Therapeutic activity, 97112- Neuromuscular re-education, 97535- Self Care, 16109- Manual therapy, [phone]- Gait training, Patient/Family education, Balance training, and Stair training  PLAN FOR NEXT SESSION: continue with endurance, balance, and strengthening ex LE and trunk  Oley Balm DPT 01/22/23 5:51 PM  Debroah Baller, PTA 01/22/23 9:33 AM

## 2023-03-26 ENCOUNTER — Other Ambulatory Visit: Payer: Self-pay | Admitting: Neurology

## 2023-03-26 NOTE — Telephone Encounter (Signed)
Last seen on 01/21/23  Follow up scheduled on 01/27/24

## 2023-06-09 ENCOUNTER — Encounter (HOSPITAL_COMMUNITY): Payer: Self-pay

## 2023-06-09 ENCOUNTER — Emergency Department (HOSPITAL_COMMUNITY)

## 2023-06-09 ENCOUNTER — Emergency Department (HOSPITAL_COMMUNITY)
Admission: EM | Admit: 2023-06-09 | Discharge: 2023-06-09 | Disposition: A | Discharge status: Home / Self Care | Attending: Emergency Medicine | Admitting: Emergency Medicine

## 2023-06-09 ENCOUNTER — Other Ambulatory Visit: Payer: Self-pay

## 2023-06-09 DIAGNOSIS — Z7901 Long term (current) use of anticoagulants: Secondary | ICD-10-CM | POA: Diagnosis not present

## 2023-06-09 DIAGNOSIS — S82831A Other fracture of upper and lower end of right fibula, initial encounter for closed fracture: Secondary | ICD-10-CM | POA: Insufficient documentation

## 2023-06-09 DIAGNOSIS — W19XXXA Unspecified fall, initial encounter: Secondary | ICD-10-CM | POA: Diagnosis not present

## 2023-06-09 DIAGNOSIS — M25562 Pain in left knee: Secondary | ICD-10-CM | POA: Diagnosis present

## 2023-06-09 NOTE — Progress Notes (Signed)
 Orthopedic Tech Progress Note Patient Details:  Beth Haynes 1945/11/25 [id number]  Ortho Devices Type of Ortho Device: Long leg splint, Cotton web roll, Ace wrap Ortho Device/Splint Location: LLE Ortho Device/Splint Interventions: Ordered, Application   Post Interventions Patient Tolerated: Well  Dawayne Ohair OTR/L 06/09/2023, 2:37 PM

## 2023-06-09 NOTE — Discharge Instructions (Signed)
 As we discussed, continue wearing your leg splint and follow-up with orthopedics to continue your care for your fracture.  Please keep weight off of this leg and do not remove the splint until advised to do so by orthopedics.  If you begin to notice pain and swelling in the leg, or if you feel sudden chest pain or shortness of breath, please contact emergency services and return to this emergency department as soon as possible.  Otherwise plan to follow-up with orthopedics and continue your care as noted.

## 2023-06-09 NOTE — ED Provider Notes (Signed)
 [place] EMERGENCY DEPARTMENT AT [facility] Provider Note   CSN: [id number] Arrival date & time: 06/09/23  1106     History Chief Complaint  Patient presents with   Fall   Leg Pain     Fall  Leg Pain  Beth Haynes is a 78 y.o. female presenting for left knee pain after a fall on this previous Saturday.  In that time she has had increased edema and pain with ambulation, came in today due to inability to bear weight on the left leg without increased pain.  She is not used any over-the-counter medication for pain, and has not applied ice to the affected area either.  She was on the stairs at home when she had a fall and had the railing give she went directly down onto the left knee, denies striking her head, denies any loss of consciousness, stating that her only area affected is the left knee and lower leg.  She has a previous medical history of atrial fibrillation,, diastolic dysfunction, and does take Eliquis  due to history of atrial fibrillation.   Patient's recorded medical, surgical, social, medication list and allergies were reviewed in the Snapshot window as part of the initial history.   Review of Systems   Review of Systems  Physical Exam Updated Vital Signs BP (!) 148/95 (BP Location: Left Arm)   Pulse 75   Temp 98.5 F (36.9 C) (Oral)   Resp 16   Ht 5\' 2"  (1.575 m)   Wt 69.5 kg   SpO2 98%   BMI 28.02 kg/m  Physical Exam   ED Course/ Medical Decision Making/ A&P    Procedures Procedures   Medications Ordered in ED Medications - No data to display  Medical Decision Making:   Beth Haynes is a 78 y.o. female who presented to the ED today with left knee and shin pain after fall detailed above.     Complete initial physical exam performed, notably the patient  was tender at the left anterior knee with edema noted at the anterior joint line.  There is no laxity of the ligaments of the left knee. Reviewed and confirmed nursing documentation for  past medical history, family history, social history.    Initial Assessment:   With the patient's presentation of left knee pain, most likely diagnosis is contusion of the left knee.. Other diagnoses were considered including (but not limited to) tibial fracture, ligamentous injury. These are considered less likely due to history of present illness and physical exam findings.     Initial Plan:  Obtain 4 view x-ray of the left knee to evaluate for bony injury or hemarthrosis. Objective evaluation as below reviewed   Initial Study Results:    Radiology:  All images reviewed independently. Agree with radiology report at this time.   DG Knee Complete 4 Views Left Result Date: 06/09/2023 CLINICAL DATA:  Pain after fall EXAM: LEFT KNEE - COMPLETE 4 VIEW COMPARISON:  None Available. FINDINGS: Osteopenia. Minimally displaced oblique fracture of the fibular neck. No additional fracture or dislocation. Preserved joint spaces. Tiny osteophytes of the patellofemoral joint. Trace joint fluid. IMPRESSION: Minimally displaced oblique fracture of the proximal fibular neck. With this fracture, dedicated tibia and fibula x-rays may be useful to further define this injury as well as assess for second injury or distal. Osteopenia.  Slight degenerative change in joint fluid. Electronically Signed   By: Adrianna Horde M.D.   On: 06/09/2023 12:42  Reassessment and Plan:   Imaging demonstrates a minimally displaced fracture of the proximal fibula.  Will apply long-leg splint to the left leg.  After consultation with orthopedics they will have her follow-up outpatient, further consultation with RN case manager for home health and mobility assistance.     Clinical Impression: No diagnosis found.   Data Unavailable   Final Clinical Impression(s) / ED Diagnoses Final diagnoses:  None    Rx / DC Orders ED Discharge Orders     None         Juanetta Nordmann, PA 06/09/23 1443    Deatra Face, MD 06/09/23 1644

## 2023-06-09 NOTE — Care Management (Addendum)
 Transition of Care [facility]) - Emergency Department Mini Assessment   Patient Details  Name: Beth Haynes MRN: [id number] Date of Birth: 1945-08-23  Transition of Care [facility]) CM/SW Contact:    Naoma Bacca, RN Phone Number: 06/09/2023, 1:25 PM   Clinical Narrative: Received TOC consult for HH/DME. This RNCM spoke with patient and spouse Gwen Lek at bedside. Patient reports she would like [facility] services, however her husband Gwen Lek was inquiring about short term SNF placement. This RNCM advised patient will need PT evaluation to determine if patient is recommended for SNF placement. Patient then reports she would like to try [facility] services HHPT/HHA no OT services. Patient reports she has the following DME prior to admission: walker, BSC, cane. Offered HH choice ([email or web address]), patient will accept any HH agency that will accept her insurance. No needed DME.  This RNCM notified Loetta Ringer with [facility] who will follow patient within the community. Will notify MD, PA for HHPT/HHA orders   - 2:10pm This RNCM spoke with patient and husband at bedside to advise [facility] has accepted the patient, info on AVS.  Patient will have long leg splint placed at bedside.    Transportation at discharge: spouse will transport patient home via POV and call fire dept/EMS to get patient out of care when returning home.  No additional TOC needs.    ED Mini Assessment: What brought you to the Emergency Department? : Patient reports having a fall on Saturday with left knee pain  Barriers to Discharge: Barriers Resolved  Barrier interventions: coordinating home health services  Means of departure: Ambulance  Interventions which prevented an admission or readmission: Home Health Consult or Services    Patient Contact and Communications        ,          Patient states their goals for this hospitalization and ongoing recovery are:: to feel better CMS Medicare.gov Compare Post Acute Care list provided to::  Patient Choice offered to / list presented to : Patient  Admission diagnosis:  Fall; Left Leg Pain Patient Active Problem List   Diagnosis Date Noted   MCI (mild cognitive impairment) 12/21/2020   Difficulty with adaptive servo-ventilation (ASV) use 12/21/2020   Excessive daytime sleepiness 12/21/2020   Epistaxis 05/16/2020   Right-sided epistaxis    Anxiety 04/26/2019   Hyperlipidemia 04/26/2019   Intracerebral hemorrhage 10/24/2017   Paroxysmal atrial fibrillation (HCC) 05/06/2017   Intolerance of continuous positive airway pressure (CPAP) ventilation 05/06/2017   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   Persistent atrial fibrillation (HCC) 02/22/2017   Depression 02/20/2017   Chronic diastolic CHF (congestive heart failure) (HCC) 02/20/2017   Obstructive sleep apnea hypopnea, severe 06/12/2016   Obesity (BMI 30-39.9) 07/11/2015   OSA (obstructive sleep apnea) 01/17/2015   HTN (hypertension) 01/17/2015   PCP:  Bentley Bray, PA Pharmacy:   [phone] - [place], [place] - [address] [place] [address] Phone: [phone] Fax: [phone]  [id number] - [place], [place] - [address] AT [facility] [address] [place] [address] Phone: [phone] Fax: [phone]

## 2023-06-09 NOTE — ED Triage Notes (Signed)
 Pt arrived reporting she fell on saturday going up the stairs, reports the railing came out wall and she felt on her left knee. States she has been in pain since. No blood thinner. Denies LOC or head injury. Pt denies dizziness, cp or any other complaints

## 2023-06-12 ENCOUNTER — Other Ambulatory Visit: Payer: Self-pay

## 2023-06-12 ENCOUNTER — Encounter (HOSPITAL_BASED_OUTPATIENT_CLINIC_OR_DEPARTMENT_OTHER): Payer: Self-pay | Admitting: Emergency Medicine

## 2023-06-12 ENCOUNTER — Emergency Department (HOSPITAL_BASED_OUTPATIENT_CLINIC_OR_DEPARTMENT_OTHER)
Admission: EM | Admit: 2023-06-12 | Discharge: 2023-06-16 | Disposition: A | Discharge status: Home / Self Care | Attending: Emergency Medicine | Admitting: Emergency Medicine

## 2023-06-12 DIAGNOSIS — I110 Hypertensive heart disease with heart failure: Secondary | ICD-10-CM | POA: Diagnosis not present

## 2023-06-12 DIAGNOSIS — Z7901 Long term (current) use of anticoagulants: Secondary | ICD-10-CM | POA: Diagnosis not present

## 2023-06-12 DIAGNOSIS — Z79899 Other long term (current) drug therapy: Secondary | ICD-10-CM | POA: Diagnosis not present

## 2023-06-12 DIAGNOSIS — I480 Paroxysmal atrial fibrillation: Secondary | ICD-10-CM | POA: Diagnosis not present

## 2023-06-12 DIAGNOSIS — M7989 Other specified soft tissue disorders: Secondary | ICD-10-CM | POA: Diagnosis not present

## 2023-06-12 DIAGNOSIS — S82832D Other fracture of upper and lower end of left fibula, subsequent encounter for closed fracture with routine healing: Secondary | ICD-10-CM | POA: Insufficient documentation

## 2023-06-12 DIAGNOSIS — I509 Heart failure, unspecified: Secondary | ICD-10-CM | POA: Diagnosis not present

## 2023-06-12 DIAGNOSIS — S8992XD Unspecified injury of left lower leg, subsequent encounter: Secondary | ICD-10-CM | POA: Diagnosis present

## 2023-06-12 DIAGNOSIS — W19XXXD Unspecified fall, subsequent encounter: Secondary | ICD-10-CM | POA: Diagnosis not present

## 2023-06-12 LAB — COMPREHENSIVE METABOLIC PANEL WITH GFR
ALT: 12 U/L (ref 0–44)
AST: 20 U/L (ref 15–41)
Albumin: 3.7 g/dL (ref 3.5–5.0)
Alkaline Phosphatase: 109 U/L (ref 38–126)
Anion gap: 11 (ref 5–15)
BUN: 15 mg/dL (ref 8–23)
CO2: 25 mmol/L (ref 22–32)
Calcium: 10.2 mg/dL (ref 8.9–10.3)
Chloride: 107 mmol/L (ref 98–111)
Creatinine, Ser: 0.88 mg/dL (ref 0.44–1.00)
GFR, Estimated: >60 mL/min (ref >60)
Glucose, Bld: 117 mg/dL — ABNORMAL HIGH (ref 70–99)
Potassium: 4.1 mmol/L (ref 3.5–5.1)
Sodium: 142 mmol/L (ref 135–145)
Total Bilirubin: 0.7 mg/dL (ref 0.0–1.2)
Total Protein: 6.1 g/dL — ABNORMAL LOW (ref 6.5–8.1)

## 2023-06-12 LAB — CBC WITH DIFFERENTIAL/PLATELET
Abs Immature Granulocytes: 0.01 10*3/uL (ref 0.00–0.07)
Basophils Absolute: 0 10*3/uL (ref 0.0–0.1)
Basophils Relative: 1 %
Eosinophils Absolute: 0.3 10*3/uL (ref 0.0–0.5)
Eosinophils Relative: 5 %
HCT: 49.9 % — ABNORMAL HIGH (ref 36.0–46.0)
Hemoglobin: 16.7 g/dL — ABNORMAL HIGH (ref 12.0–15.0)
Immature Granulocytes: 0 %
Lymphocytes Relative: 27 %
Lymphs Abs: 1.3 10*3/uL (ref 0.7–4.0)
MCH: 30.3 pg (ref 26.0–34.0)
MCHC: 33.5 g/dL (ref 30.0–36.0)
MCV: 90.4 fL (ref 80.0–100.0)
Monocytes Absolute: 0.4 10*3/uL (ref 0.1–1.0)
Monocytes Relative: 8 %
Neutro Abs: 2.9 10*3/uL (ref 1.7–7.7)
Neutrophils Relative %: 59 %
Platelets: 209 10*3/uL (ref 150–400)
RBC: 5.52 MIL/uL — ABNORMAL HIGH (ref 3.87–5.11)
RDW: 14.9 % (ref 11.5–15.5)
WBC: 4.8 10*3/uL (ref 4.0–10.5)
nRBC: 0 % (ref 0.0–0.2)

## 2023-06-12 NOTE — ED Provider Notes (Signed)
 Beth Haynes Provider Note   CSN: [id number] Arrival date & time: 06/12/23  1516     History  Chief Complaint  Patient presents with   Social Work Need    Beth Haynes is a 78 y.o. female with past medical history of obstructive sleep apnea, hypertension, obesity, congestive heart failure, paroxysmal A-fib on Eliquis  is presenting to ER with concern of placement need.  Patient had recent fracture of the proximal fibula which is mildly displaced, this was diagnosed 06/09/23 at [facility] ER. She was placed in splint. When she fell she did not hit head and no imaging of head and neck needed. Patient was recommended yesterday to be evaluated by PT/OT however she wanted trial of HH. She is coming in to with complaint of needing placement, her husband and her are not have able to care for her at home due to this injury. Denies any change in symptoms since evaluated at [facility] ER.  HPI     Home Medications Prior to Admission medications   Medication Sig Start Date End Date Taking? Authorizing Provider  budesonide (ENTOCORT EC) 3 MG 24 hr capsule Take 9 mg by mouth as needed. 10/05/19   [provider]  Cholecalciferol 1.25 MG (50000 UT) TABS Take 50,000 Units by mouth every Tuesday.    [provider]  diltiazem  (CARDIZEM  CD) 180 MG 24 hr capsule Take 1 capsule (180 mg total) by mouth daily. 08/13/22 11/11/22  Nahser, Lela Purple, MD  diltiazem  (CARDIZEM  CD) 360 MG 24 hr capsule TAKE 1 CAPSULE BY MOUTH EVERY DAY Patient taking differently: 180 mg. TAKE 1 CAPSULE BY MOUTH EVERY DAY 10/04/22   Nahser, Lela Purple, MD  donepezil  (ARICEPT ) 10 MG tablet TAKE 1 TABLET BY MOUTH EVERYDAY AT BEDTIME 03/26/23   Dohmeier, Raoul Byes, MD  ELIQUIS  5 MG TABS tablet TAKE 1 TABLET(5 MG) BY MOUTH TWICE DAILY 09/25/22   Nahser, Lela Purple, MD  metoprolol  succinate (TOPROL -XL) 100 MG 24 hr tablet TAKE 1 TABLET(100 MG) BY MOUTH DAILY WITH OR IMMEDIATELY FOLLOWING A MEAL 10/04/22    Nahser, Lela Purple, MD      Allergies    Sulfa antibiotics, Codeine, and Epinephrine     Review of Systems   Review of Systems  Musculoskeletal:  Positive for arthralgias.    Physical Exam Updated Vital Signs BP (!) 143/104 (BP Location: Left Arm)   Pulse 80   Temp 97.8 F (36.6 C) (Oral)   Resp 16   Ht 5\' 2"  (1.575 m)   Wt 69.5 kg   SpO2 97%   BMI 28.02 kg/m  Physical Exam Vitals and nursing note reviewed.  Constitutional:      General: She is not in acute distress.    Appearance: She is not toxic-appearing.  HENT:     Head: Normocephalic and atraumatic.  Eyes:     General: No scleral icterus.    Conjunctiva/sclera: Conjunctivae normal.  Cardiovascular:     Rate and Rhythm: Normal rate and regular rhythm.     Pulses: Normal pulses.     Heart sounds: Normal heart sounds.  Pulmonary:     Effort: Pulmonary effort is normal. No respiratory distress.     Breath sounds: Normal breath sounds.  Abdominal:     General: Abdomen is flat. Bowel sounds are normal.     Palpations: Abdomen is soft.     Tenderness: There is no abdominal tenderness.  Musculoskeletal:     Comments: Splint on left lower  leg.   Skin:    General: Skin is warm and dry.     Findings: No lesion.  Neurological:     General: No focal deficit present.     Mental Status: She is alert and oriented to person, place, and time. Mental status is at baseline.     ED Results / Procedures / Treatments   Labs (all labs ordered are listed, but only abnormal results are displayed) Labs Reviewed  CBC WITH DIFFERENTIAL/PLATELET - Abnormal; Notable for the following components:      Result Value   RBC 5.52 (*)    Hemoglobin 16.7 (*)    HCT 49.9 (*)    All other components within normal limits  COMPREHENSIVE METABOLIC PANEL WITH GFR    EKG None  Radiology No results found.  Procedures Procedures    Medications Ordered in ED Medications - No data to display  ED Course/ Medical Decision Making/  A&P Clinical Course as of 06/12/23 1733  Thu Jun 12, 2023  1635 Dr Isaiah Marc accepting patient for ED to ED.  Transfer via transport.  [JB]    Clinical Course User Index [JB] Janett Kamath, Kandace Organ, PA-C                                 Medical Decision Making Amount and/or Complexity of Data Reviewed Labs: ordered.   This patient presents to the ED for concern of fall, this involves an extensive number of treatment options, and is a complaint that carries with it a high risk of complications and morbidity.  The differential diagnosis includes fracture, dislocation, AKI, dehydration, electrolyte abnormality, urinary tract infection   Co morbidities that complicate the patient evaluation  Obstructive sleep apnea, hypertension, obesity, congestive heart failure, paroxysmal A-fib on Eliquis    Additional history obtained:  Additional history obtained from ED visit for 28 2025, RN and OT note from same day.  At that time it was recommended PT eval and placement.   Lab Tests:  I personally interpreted labs.  The pertinent results include:   CBC, BMP  for medical clearance. Denies urinary symptoms.    Imaging Studies ordered:  Will not repeat imaging.    Cardiac Monitoring: / EKG:  The patient was maintained on a cardiac monitor.  I personally viewed and interpreted the cardiac monitored which showed an underlying rhythm of: sinus    Consultations Obtained:  I requested consultation with the TOC,  and discussed lab and imaging findings as well as pertinent plan - they recommend: PT/OT and SNF    Problem List / ED Course / Critical interventions / Medication management  Reporting to emergency room with complaint of needing placement.  Patient had a recent fall which resulted in a left proximal fibular fracture.  She was placed in a long-leg splint.  This was evaluated 06/09/2023 by SW and OT who at that time had recommended PT evaluation for SNF placement given injury.  She declined  and preferred trial of home health and having her husband help take care of her.  She returns today with needing placement.  Reports she is unable to care for herself and her husband cannot help. Of note, patient's husband raising voice, screaming at staff and feels he has been lied to. Patient is calm and cooperative, accepting all help and agrees to treatment plan. She is hemodynamically stable and otherwise well-appearing plan is to check medical clearance labs and have her  evaluated for placement. She has no new injuries and no new complaints at this time.   I have reviewed the patients home medicines and have made adjustments as needed   Plan  Plan ED to ED transfer to [facility] for PT/OT eval and SNF placement for recent fracture.          Final Clinical Impression(s) / ED Diagnoses Final diagnoses:  Closed fracture of proximal end of left fibula with routine healing, unspecified fracture morphology, subsequent encounter    Rx / DC Orders ED Discharge Orders     None         Eudora Heron, PA-C 06/12/23 Perley Bradley, MD 06/13/23 2339

## 2023-06-12 NOTE — ED Triage Notes (Signed)
 Pt sts she needs to go to facility where she can be cared for while she rehabs from leg fx; sts her husband cannot provide care

## 2023-06-12 NOTE — ED Notes (Signed)
 Pt BIB [facility] from [facility]. Pt has L fibula fx and needs social work consult for placement at a rehab/ALF.

## 2023-06-12 NOTE — Care Management (Signed)
 Received message from RN that patient is wanting SNF placement. This RNCM advised patient will need to be transferred to [facility] ED or [facility] ED for PT eval & recommendation. TOC consult on file.

## 2023-06-13 MED ORDER — DILTIAZEM HCL ER COATED BEADS 180 MG PO CP24
180.0000 mg | ORAL_CAPSULE | Freq: Every day | ORAL | Status: DC
  Administered 2023-06-14 – 2023-06-16 (×3): 180 mg via ORAL
  Filled 2023-06-13 (×4): qty 1

## 2023-06-13 MED ORDER — OXYCODONE-ACETAMINOPHEN 5-325 MG PO TABS
1.0000 | ORAL_TABLET | Freq: Three times a day (TID) | ORAL | Status: DC | PRN
  Administered 2023-06-13: 1 via ORAL
  Filled 2023-06-13: qty 1

## 2023-06-13 MED ORDER — APIXABAN 5 MG PO TABS
5.0000 mg | ORAL_TABLET | Freq: Two times a day (BID) | ORAL | Status: DC
  Administered 2023-06-13 – 2023-06-16 (×5): 5 mg via ORAL
  Filled 2023-06-13 (×6): qty 1

## 2023-06-13 MED ORDER — METOPROLOL SUCCINATE ER 100 MG PO TB24
100.0000 mg | ORAL_TABLET | Freq: Every day | ORAL | Status: DC
  Administered 2023-06-14 – 2023-06-16 (×3): 100 mg via ORAL
  Filled 2023-06-13 (×4): qty 1

## 2023-06-13 MED ORDER — DONEPEZIL HCL 10 MG PO TABS
10.0000 mg | ORAL_TABLET | Freq: Every day | ORAL | Status: DC
  Administered 2023-06-13 – 2023-06-15 (×2): 10 mg via ORAL
  Filled 2023-06-13 (×2): qty 1

## 2023-06-13 NOTE — TOC CM/SW Note (Signed)
 SW met with patient at bedside to discuss SNF placement. Patient was in good spirits,she is independent at baseline but uses a cane for support at times. Patient was coming up from her basement, fell on her knee and has a L fibula fracture and needs SNF for rehab. Patient is willing to go to a SNF she does not have a preference but is interested in [place] as well were her husband can get too and close to home.   SW completed FL2, PASSAR and submitted referrals to SNF including [facility], [address], [address], [place], [place], [place], and [facility].   .Lashika Erker, MSW, LCSWA Transition of Care  Clinical Social Worker (ED 3-11 Mon-Fri)  [phone]

## 2023-06-13 NOTE — Evaluation (Signed)
 Physical Therapy Evaluation Patient Details Name: Beth Haynes MRN: [id number] DOB: [birth year] Today's Date: 06/13/2023  History of Present Illness  78 y.o. female adm 5/1 presenting for left knee pain after a fall and inability to bear weight on the left leg without increased pain.  PMH includes: OSA, HTN, CHF, Afib and ICH.  X-ray: Minimally displaced oblique fracture of the proximal fibular neck.  Seen in ED at [facility] then sent home via ambulance, now returns due to difficulty managing at home.  Clinical Impression  Patient presents with decreased mobility due to pain, limited weight bearing, generalized weakness and decreased activity tolerance.  She will benefit from skilled PT in the acute setting and from post-acute inpatient rehab (<3 hours/day) prior to d/c home.  Of note pt with L SIDED FACIAL DROOP AND SOME SLURRED SPEECH.  She feels due to pain meds overnight she is not used to.  RN aware.  PT will follow unless d/c to rehab.         If plan is discharge home, recommend the following: A lot of help with walking and/or transfers;A lot of help with bathing/dressing/bathroom;Assist for transportation;Help with stairs or ramp for entrance;Assistance with cooking/housework   Can travel by private vehicle   No    Equipment Recommendations Other (comment) (TBA)  Recommendations for Other Services       Functional Status Assessment Patient has had a recent decline in their functional status and demonstrates the ability to make significant improvements in function in a reasonable and predictable amount of time.     Precautions / Restrictions Precautions Precautions: Fall Recall of Precautions/Restrictions: Intact Required Braces or Orthoses: Splint/Cast Splint/Cast: L lower leg Splint/Cast - Date Prophylactic Dressing Applied (if applicable): 06/13/23 Restrictions Weight Bearing Restrictions Per Provider Order: Yes LLE Weight Bearing Per Provider Order: Non weight bearing (per previous  visit to [facility] ED)      Mobility  Bed Mobility Overal bed mobility: Needs Assistance Bed Mobility: Supine to Sit, Sit to Supine, Rolling Rolling: Supervision, Used rails   Supine to sit: HOB elevated, Supervision Sit to supine: Min assist   General bed mobility comments: some assist for L LE onto bed and cues for scooting, rolled to place clean brief    Transfers   Equipment used: None, Rolling walker (2 wheels) Transfers: Sit to/from Stand, Bed to chair/wheelchair/BSC Sit to Stand: Mod assist Stand pivot transfers: Mod assist   Squat pivot transfers: Mod assist     General transfer comment: to chair without device with mod A for safety from higher surface to lower; stood to RW with A for lifting and balance, cues for pivot on R foot with RW to sit on EOB PT stabilizing walker and pt; used step stool to scoot back on bed    Ambulation/Gait                  Stairs            Wheelchair Mobility     Tilt Bed    Modified Rankin (Stroke Patients Only)       Balance Overall balance assessment: Needs assistance Sitting-balance support: Feet supported Sitting balance-Leahy Scale: Fair     Standing balance support: Bilateral upper extremity supported Standing balance-Leahy Scale: Poor Standing balance comment: due to NWB on L                             Pertinent Vitals/Pain Pain Assessment Faces  Pain Scale: Hurts little more Pain Location: L leg Pain Descriptors / Indicators: Sore Pain Intervention(s): Repositioned, Monitored during session    Home Living Family/patient expects to be discharged to:: Private residence Living Arrangements: Spouse/significant other Available Help at Discharge: Family;Available 24 hours/day Type of Home: House Home Access: Stairs to enter Entrance Stairs-Rails: Left Entrance Stairs-Number of Steps: 8   Home Layout: Two level;Laundry or work area in basement;Able to live on main level with  bedroom/bathroom Home Equipment: Agricultural consultant (2 wheels);Grab bars - tub/shower;Cane - single point      Prior Function Prior Level of Function : Independent/Modified Independent             Mobility Comments: was independent prior to fall, states rail broke on steps which is why she fell ADLs Comments: Mod I for ADL and iADL.  No longer drives.     Extremity/Trunk Assessment   Upper Extremity Assessment Upper Extremity Assessment: Overall WFL for tasks assessed    Lower Extremity Assessment Lower Extremity Assessment: LLE deficits/detail LLE Deficits / Details: wiggles toes, splint up behind knee though can flex knee some and lifts leg though with difficulty antigravity LLE: Unable to fully assess due to immobilization    Cervical / Trunk Assessment Cervical / Trunk Assessment: Kyphotic;Other exceptions Cervical / Trunk Exceptions: note some R facial droop, pt states due to meds, RN aware  Communication   Communication Communication: Impaired Factors Affecting Communication: Reduced clarity of speech (slurring of words)    Cognition Arousal: Alert Behavior During Therapy: WFL for tasks assessed/performed   PT - Cognitive impairments: No apparent impairments                         Following commands: Intact       Cueing       General Comments General comments (skin integrity, edema, etc.): spouse in the room and supportive though slower to respond and with significant hearing deficit; pt relates he normally takes care of their dog and three cats.  Bed and brief soiled with urine so assisted to change linen, assisted for hygiene with pt standing then to apply clean brief    Exercises     Assessment/Plan    PT Assessment Patient needs continued PT services  PT Problem List Decreased strength;Decreased mobility;Decreased activity tolerance;Decreased balance;Pain;Decreased knowledge of use of DME       PT Treatment Interventions DME  instruction;Therapeutic exercise;Gait training;Balance training;Wheelchair mobility training;Functional mobility training;Therapeutic activities;Patient/family education    PT Goals (Current goals can be found in the Care Plan section)  Acute Rehab PT Goals Patient Stated Goal: return to independent PT Goal Formulation: With patient/family Time For Goal Achievement: 06/27/23 Potential to Achieve Goals: Good    Frequency Min 2X/week     Co-evaluation               AM-PAC PT "6 Clicks" Mobility  Outcome Measure Help needed turning from your back to your side while in a flat bed without using bedrails?: A Little Help needed moving from lying on your back to sitting on the side of a flat bed without using bedrails?: A Little Help needed moving to and from a bed to a chair (including a wheelchair)?: A Lot Help needed standing up from a chair using your arms (e.g., wheelchair or bedside chair)?: A Lot Help needed to walk in hospital room?: Total Help needed climbing 3-5 steps with a railing? : Total 6 Click Score: 12  End of Session Equipment Utilized During Treatment: Gait belt Activity Tolerance: Patient tolerated treatment well Patient left: in bed;with call bell/phone within reach;with family/visitor present Nurse Communication: Mobility status PT Visit Diagnosis: Other abnormalities of gait and mobility (R26.89);Difficulty in walking, not elsewhere classified (R26.2)    Time: 9604-5409 PT Time Calculation (min) (ACUTE ONLY): 40 min   Charges:   PT Evaluation $PT Eval Moderate Complexity: 1 Mod PT Treatments $Therapeutic Activity: 23-37 mins PT General Charges $$ ACUTE PT VISIT: 1 Visit         Abigail Hoff, PT Acute Rehabilitation Services Office:[phone] 06/13/2023   Marley Simmers 06/13/2023, 10:39 AM

## 2023-06-13 NOTE — Evaluation (Signed)
 Occupational Therapy Evaluation Patient Details Name: Beth Haynes MRN: [id number] DOB: 1945/05/05 Today's Date: 06/13/2023   History of Present Illness   78 y.o. female adm 5/1 presenting for left knee pain after a fall and inability to bear weight on the left leg without increased pain.  PMH includes: OSA, HTN, CHF, Afib and ICH.  X-ray: Minimally displaced oblique fracture of the proximal fibular neck.     Clinical Impressions Patient admitted for the diagnosis above.  PTA she lives at home with her spouse, who is unable to provide the needed assist at home.  Patient presents with painful L lower extremity, poor activity tolerance, poor static balance, and generalized weakness, currently needing up to Max A for simple transfers and lower body ADL.  OT will follow in the acute setting to address deficits, and Patient will benefit from continued inpatient follow up therapy, <3 hours/day.     If plan is discharge home, recommend the following:   Assist for transportation;Assistance with cooking/housework;A lot of help with walking and/or transfers;A lot of help with bathing/dressing/bathroom     Functional Status Assessment   Patient has had a recent decline in their functional status and demonstrates the ability to make significant improvements in function in a reasonable and predictable amount of time.     Equipment Recommendations   BSC/3in1;Tub/shower bench;Wheelchair (measurements OT)     Recommendations for Other Services         Precautions/Restrictions   Precautions Precautions: Fall Recall of Precautions/Restrictions: Intact Required Braces or Orthoses: Splint/Cast Splint/Cast: L lower leg Restrictions Weight Bearing Restrictions Per Provider Order: No Other Position/Activity Restrictions: No formal order, but patient stating she cannot place weight through the L leg.     Mobility Bed Mobility Overal bed mobility: Needs Assistance Bed Mobility: Supine to  Sit, Sit to Supine     Supine to sit: Min assist Sit to supine: Min assist        Transfers Overall transfer level: Needs assistance   Transfers: Sit to/from Stand Sit to Stand: Min assist                  Balance Overall balance assessment: Needs assistance Sitting-balance support: Feet unsupported Sitting balance-Leahy Scale: Fair     Standing balance support: Reliant on assistive device for balance Standing balance-Leahy Scale: Poor                             ADL either performed or assessed with clinical judgement   ADL Overall ADL's : Needs assistance/impaired Eating/Feeding: Set up;Sitting   Grooming: Wash/dry hands;Wash/dry face;Set up;Sitting   Upper Body Bathing: Supervision/ safety;Sitting   Lower Body Bathing: Maximal assistance;Sit to/from stand;Sitting/lateral leans   Upper Body Dressing : Contact guard assist;Sitting   Lower Body Dressing: Maximal assistance;Sit to/from stand;Sitting/lateral leans   Toilet Transfer: Maximal assistance;Stand-pivot;BSC/3in1   Toileting- Clothing Manipulation and Hygiene: Maximal assistance;Sit to/from stand               Vision Patient Visual Report: No change from baseline       Perception Perception: Not tested       Praxis Praxis: Not tested       Pertinent Vitals/Pain Pain Assessment Pain Assessment: Faces Faces Pain Scale: Hurts even more Pain Location: L leg Pain Descriptors / Indicators: Grimacing, Sore Pain Intervention(s): Monitored during session     Extremity/Trunk Assessment Upper Extremity Assessment Upper Extremity Assessment: Overall WFL for tasks assessed  Lower Extremity Assessment Lower Extremity Assessment: Defer to PT evaluation   Cervical / Trunk Assessment Cervical / Trunk Assessment: Kyphotic   Communication Communication Communication: No apparent difficulties   Cognition Arousal: Alert Behavior During Therapy: WFL for tasks  assessed/performed Cognition: No apparent impairments             OT - Cognition Comments: Patient stating "loopy" from pain meds.                 Following commands: Intact       Cueing  General Comments   Cueing Techniques: Verbal cues;Tactile cues      Exercises     Shoulder Instructions      Home Living Family/patient expects to be discharged to:: Private residence Living Arrangements: Spouse/significant other Available Help at Discharge: Family;Available 24 hours/day Type of Home: House Home Access: Stairs to enter [employer] of Steps: 8 Entrance Stairs-Rails: Left Home Layout: Two level;Laundry or work area in basement;Able to live on main level with bedroom/bathroom     Bathroom Shower/Tub: Chief Strategy Officer: Standard Bathroom Accessibility: Yes How Accessible: Accessible via walker Home Equipment: Agricultural consultant (2 wheels);Grab bars - tub/shower;Cane - single point          Prior Functioning/Environment Prior Level of Function : Independent/Modified Independent             Mobility Comments: Walks with a SPC, uses RW in the shower for stability. ADLs Comments: Mod I for ADL and iADL.  No longer drives.    OT Problem List: Decreased strength;Decreased activity tolerance;Impaired balance (sitting and/or standing);Pain   OT Treatment/Interventions: Self-care/ADL training;Therapeutic activities;Patient/family education;Balance training;DME and/or AE instruction      OT Goals(Current goals can be found in the care plan section)   Acute Rehab OT Goals Patient Stated Goal: Eventually return home OT Goal Formulation: With patient Time For Goal Achievement: 06/27/23 Potential to Achieve Goals: Good   OT Frequency:  Min 2X/week    Co-evaluation              AM-PAC OT "6 Clicks" Daily Activity     Outcome Measure Help from another person eating meals?: None Help from another person taking care of  personal grooming?: None Help from another person toileting, which includes using toliet, bedpan, or urinal?: A Lot Help from another person bathing (including washing, rinsing, drying)?: A Lot Help from another person to put on and taking off regular upper body clothing?: A Little Help from another person to put on and taking off regular lower body clothing?: A Lot 6 Click Score: 17   End of Session Equipment Utilized During Treatment: Gait belt;Rolling walker (2 wheels) Nurse Communication: Mobility status  Activity Tolerance: Patient tolerated treatment well Patient left: in bed;with call bell/phone within reach  OT Visit Diagnosis: Unsteadiness on feet (R26.81);Muscle weakness (generalized) (M62.81)                Time: 2130-8657 OT Time Calculation (min): 31 min Charges:  OT General Charges $OT Visit: 1 Visit OT Evaluation $OT Eval Moderate Complexity: 1 Mod OT Treatments $Self Care/Home Management : 8-22 mins  06/13/2023  RP, OTR/L  Acute Rehabilitation Services  Office:  [phone]   Benjamen Brand 06/13/2023, 8:52 AM

## 2023-06-13 NOTE — Progress Notes (Signed)
 Orthopedic Tech Progress Note Patient Details:  ANEA WISCH 12-13-1945 [id number] Removed current splint and reapplied a long leg splint with assistance from RN per order.  Ortho Devices Type of Ortho Device: Ace wrap, Cotton web roll, Long leg splint Ortho Device/Splint Location: LLE Ortho Device/Splint Interventions: Ordered, Application, Adjustment, Removal   Post Interventions Patient Tolerated: Well Instructions Provided: Adjustment of device, Care of device, Poper ambulation with device  Rayna Calkin 06/13/2023, 1:10 AM

## 2023-06-13 NOTE — ED Notes (Signed)
 Pt called this RN to bedside with concerns of splint rubbing into her heel. Pt splint noted to be coming off around ankle. PA tran notified of situation and that it was causing lot of pain. PA tran ordered pain meds for pt as well as placed a new order for splint. Ortho tech was called and notified that splint was needed. After medication and splint was fixed, pt reported her pain was 0/10 and she felt much better. Pt was given call light remote so she could watch TV. No other needs were voiced from pt at this time.

## 2023-06-13 NOTE — ED Notes (Signed)
 PT at bedside.

## 2023-06-13 NOTE — ED Notes (Signed)
 Spouse to nurses desk to request assistance.  States pt is sweaty.  Upon entry to room, pt is alert and answering simple questions appropriately. Denies pain/SOB. Skin is slightly warm with perspiration at hair line on forehead.  Pt is afebrile. Assisted pt with bedpan.

## 2023-06-13 NOTE — NC FL2 (Signed)
 [place]  MEDICAID FL2 LEVEL OF CARE FORM     IDENTIFICATION  Patient Name: Beth Haynes Birthdate: 1945/12/30 Sex: female Admission Date (Current Location): 06/12/2023  [facility] and [place] Number:  Producer, television/film/video and Address:         Provider Number:    Attending Physician Name and Address:  System, Provider Not In  Relative Name and Phone Number:       Current Level of Care:   Recommended Level of Care: Skilled Nursing Facility Prior Approval Number:    Date Approved/Denied:   PASRR Number: [id number]  Discharge Plan: SNF    Current Diagnoses: Patient Active Problem List   Diagnosis Date Noted   MCI (mild cognitive impairment) 12/21/2020   Difficulty with adaptive servo-ventilation (ASV) use 12/21/2020   Excessive daytime sleepiness 12/21/2020   Epistaxis 05/16/2020   Right-sided epistaxis    Anxiety 04/26/2019   Hyperlipidemia 04/26/2019   Intracerebral hemorrhage 10/24/2017   Paroxysmal atrial fibrillation (HCC) 05/06/2017   Intolerance of continuous positive airway pressure (CPAP) ventilation 05/06/2017   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   Persistent atrial fibrillation (HCC) 02/22/2017   Depression 02/20/2017   Chronic diastolic CHF (congestive heart failure) (HCC) 02/20/2017   Obstructive sleep apnea hypopnea, severe 06/12/2016   Obesity (BMI 30-39.9) 07/11/2015   OSA (obstructive sleep apnea) 01/17/2015   HTN (hypertension) 01/17/2015    Orientation RESPIRATION BLADDER Height & Weight     Self, Time, Situation, Place  Normal Continent Weight: 153 lb 3.5 oz (69.5 kg) Height:  5\' 2"  (157.5 cm)  BEHAVIORAL SYMPTOMS/MOOD NEUROLOGICAL BOWEL NUTRITION STATUS      Continent Diet  AMBULATORY STATUS COMMUNICATION OF NEEDS Skin   Independent Verbally Normal                       Personal Care Assistance Level of Assistance  Bathing, Feeding, Dressing Bathing Assistance: Independent Feeding assistance: Independent Dressing  Assistance: Independent     Functional Limitations Info  Sight, Hearing, Speech Sight Info: Adequate Hearing Info: Adequate Speech Info: Adequate    SPECIAL CARE FACTORS FREQUENCY  PT (By licensed PT), OT (By licensed OT)     PT Frequency: 5x/week OT Frequency: 5x/week            Contractures Contractures Info: Not present    Additional Factors Info  Code Status Code Status Info: Full Code             Current Medications (06/13/2023):  This is the current hospital active medication list Current Facility-Administered Medications  Medication Dose Route Frequency Provider Last Rate Last Admin   apixaban  (ELIQUIS ) tablet 5 mg  5 mg Oral BID Curatolo, Adam, DO       diltiazem  (CARDIZEM  CD) 24 hr capsule 180 mg  180 mg Oral Daily Curatolo, Adam, DO       donepezil  (ARICEPT ) tablet 10 mg  10 mg Oral QHS Curatolo, Adam, DO       metoprolol  succinate (TOPROL -XL) 24 hr tablet 100 mg  100 mg Oral Daily Curatolo, Adam, DO       oxyCODONE -acetaminophen  (PERCOCET/ROXICET) 5-325 MG per tablet 1 tablet  1 tablet Oral Q8H PRN Debbra Fairy, PA-C   1 tablet at 06/13/23 2130   Current Outpatient Medications  Medication Sig Dispense Refill   Cholecalciferol 1.25 MG (50000 UT) capsule Take 50,000 Units by mouth every Tuesday.     diltiazem  (CARDIZEM  CD) 180 MG 24 hr capsule Take 1 capsule (180 mg total)  by mouth daily. 90 capsule 3   donepezil  (ARICEPT ) 10 MG tablet TAKE 1 TABLET BY MOUTH EVERYDAY AT BEDTIME 30 tablet 10   ELIQUIS  5 MG TABS tablet TAKE 1 TABLET(5 MG) BY MOUTH TWICE DAILY 180 tablet 1   metoprolol  succinate (TOPROL -XL) 100 MG 24 hr tablet TAKE 1 TABLET(100 MG) BY MOUTH DAILY WITH OR IMMEDIATELY FOLLOWING A MEAL 90 tablet 3   Polyethyl Glycol-Propyl Glycol (SYSTANE) 0.4-0.3 % SOLN Place 2 drops into both eyes 3 (three) times a week.       Discharge Medications: Please see discharge summary for a list of discharge medications.  Relevant Imaging Results:  Relevant Lab  Results:   Additional Information SS#[id number]  .Winfield Hau, MSW, LCSWA Transition of Care  Clinical Social Worker (ED 3-11 Mon-Fri)  [phone]

## 2023-06-14 NOTE — ED Notes (Signed)
 Rounded on pt and pt stated that she was very unhappy with the food here and requested a pt advocate. Pt was informed of no pt advocate on the weekend but if there was anything that she needed from a medical standpoint I would be of help, she declined at this time. NT changed pt d/t bed sheets and brief being soiled. New sheets given and pt changed. Crackers were given and pt is on the phone w/ a friend

## 2023-06-15 NOTE — ED Notes (Signed)
 I put pt on bedpan. Pt was able to roll side to side without assist

## 2023-06-15 NOTE — Progress Notes (Addendum)
 10:10am: CSW received call from [place] who states he did review ratings online but wants patient to make final decision on placement.  CSW spoke with patient to discuss bed offer from [facility]. Patient states she wants to accept bed offer.  CSW will initiate insurance authorization.  9:30am: CSW spoke with patient's husband Adaline Holly to present him with bed offer from [facility]. Adaline Holly states he wants to research the facility and he will return call to CSW.   Shepard Dicker, MSW, LCSW Transitions of Care  Clinical Social Worker II [phone]

## 2023-06-16 NOTE — Discharge Instructions (Signed)
 Please follow-up with Dr. Adrain Alar about your broken ankle.  Return to the emergency department if it starts becoming severely painful, numb, weak, swollen, or you develop any other concerning symptoms such as chest pain or shortness of breath.

## 2023-06-16 NOTE — Progress Notes (Addendum)
 Physical Therapy Treatment Patient Details Name: Beth Haynes MRN: [id number] DOB: 20-Mar-1945 Today's Date: 06/16/2023   History of Present Illness 78 y.o. female adm 5/1 presenting for left knee pain after a fall and inability to bear weight on the left leg without increased pain.  PMH includes: OSA, HTN, CHF, Afib and ICH.  X-ray: Minimally displaced oblique fracture of the proximal fibular neck.  Seen in ED at [facility] then sent home via ambulance, now returns due to difficulty managing at home.    PT Comments  Pt seen for PT tx with pt agreeable. Pt is anticipating d/c to SNF rehab today. Pt is able to complete bed mobility from ED stretcher with supervision<>min assist & extra time. Elevated bed<>low chair transfer via lateral scoot to chair, squat pivot back to bed. Pt requires increased assistance with squat pivot from low to high surface 2/2 overall weakness. PT does educate pt on head/hips relationship for increased ease of movement. Recommend ongoing PT services to progress mobility as able.     If plan is discharge home, recommend the following: A lot of help with walking and/or transfers;A lot of help with bathing/dressing/bathroom;Assist for transportation;Help with stairs or ramp for entrance;Assistance with cooking/housework   Can travel by private vehicle     No  Equipment Recommendations  Other (comment) (TBD in next venue)    Recommendations for Other Services       Precautions / Restrictions Precautions Precautions: Fall Required Braces or Orthoses: Splint/Cast Splint/Cast: L lower leg Restrictions Weight Bearing Restrictions Per Provider Order: No LLE Weight Bearing Per Provider Order: Non weight bearing     Mobility  Bed Mobility Overal bed mobility: Needs Assistance Bed Mobility: Supine to Sit, Sit to Supine, Rolling     Supine to sit: Min Assist, HOB elevated Sit to supine: Supervision, HOB elevated   General bed mobility comments: extra time, ED stretcher     Transfers Overall transfer level: Needs assistance Equipment used: None Transfers: Bed to chair/wheelchair/BSC       Squat pivot transfers: Max assist, Total assist (low chair>elevated ED stretcher with PT blocking R knee for added support during pivoting while on RLE)    Lateral/Scoot Transfers: Min assist (elevated bed>low chair, PT supporting LLE to ensure NWB, assisting with scooting, education re: head/hips relationship with improved ability to scoot)      Ambulation/Gait                   Stairs             Wheelchair Mobility     Tilt Bed    Modified Rankin (Stroke Patients Only)       Balance Overall balance assessment: Needs assistance Sitting-balance support: Feet supported Sitting balance-Leahy Scale: Fair Sitting balance - Comments: static sitting FAIR                                    Communication    Cognition Arousal: Alert Behavior During Therapy: WFL for tasks assessed/performed   PT - Cognitive impairments: No apparent impairments                         Following commands: Intact      Cueing Cueing Techniques: Verbal cues, Tactile cues  Exercises General Exercises - Lower Extremity Straight Leg Raises: AAROM, Supine, Strengthening, Left, 10 reps (unable to maintain full knee extension despite  education to attempt)    General Comments        Pertinent Vitals/Pain Pain Assessment Pain Assessment: No/denies pain    Home Living                          Prior Function            PT Goals (current goals can now be found in the care plan section) Acute Rehab PT Goals Patient Stated Goal: return to independent PT Goal Formulation: With patient/family Time For Goal Achievement: 06/27/23 Potential to Achieve Goals: Fair Progress towards PT goals: Progressing toward goals    Frequency    Min 2X/week      PT Plan      Co-evaluation              AM-PAC PT "6 Clicks"  Mobility   Outcome Measure  Help needed turning from your back to your side while in a flat bed without using bedrails?: A Little Help needed moving from lying on your back to sitting on the side of a flat bed without using bedrails?: A Little Help needed moving to and from a bed to a chair (including a wheelchair)?: A Lot Help needed standing up from a chair using your arms (e.g., wheelchair or bedside chair)?: A Lot Help needed to walk in hospital room?: Total Help needed climbing 3-5 steps with a railing? : Total 6 Click Score: 12    End of Session Equipment Utilized During Treatment: Gait belt Activity Tolerance: Patient tolerated treatment well (limited by EMS arriving to take pt to SNF rehab) Patient left: in bed;with call bell/phone within reach (EMTs in room) Nurse Communication: Mobility status PT Visit Diagnosis: Other abnormalities of gait and mobility (R26.89);Difficulty in walking, not elsewhere classified (R26.2);Muscle weakness (generalized) (M62.81)     Time: 0865-7846 PT Time Calculation (min) (ACUTE ONLY): 11 min  Charges:    $Therapeutic Activity: 8-22 mins PT General Charges $$ ACUTE PT VISIT: 1 Visit                     Emaline Handsome, PT, DPT 06/16/23, 10:53 AM   Venetta Gill 06/16/2023, 10:52 AM

## 2023-06-16 NOTE — Progress Notes (Addendum)
 8:45am: CSW spoke with patient's husband Gwen Lek to inform him of discharge plan.  CSW spoke with RN and MD to inform them of discharge plan.  Patient can go to [facility] via [facility] - RN to call when ready. The number to call for report is [phone].  7:35am: Patient's insurance authorization has been approved #[id number], next review date is 06/18/23.  CSW notified admissions at [facility] of approval.  Shepard Dicker, MSW, LCSW Transitions of Care  Clinical Social Worker II [phone]

## 2023-06-16 NOTE — ED Provider Notes (Signed)
 Emergency Medicine Observation Re-evaluation Note  Beth Haynes is a 78 y.o. female, seen on rounds today.  Pt initially presented to the ED for complaints of Social Work Need Currently, the patient is stating that her foot feels better after her splint has been readjusted.  No significant pain.  Physical Exam  BP 133/74   Pulse 85   Temp 97.7 F (36.5 C) (Oral)   Resp 17   Ht 5\' 2"  (1.575 m)   Wt 69.5 kg   SpO2 99%   BMI 28.02 kg/m  Physical Exam General: Resting comfortably in stretcher Lungs: Normal work of breathing Psych: Calm MSK: Splint on left lower extremity.  Foot appears warm and well-perfused.  ED Course / MDM  EKG:EKG Interpretation Date/Time:  Friday Jun 13 2023 19:46:38 EDT Ventricular Rate:  73 PR Interval:    QRS Duration:  103 QT Interval:  429 QTC Calculation: 399 R Axis:   104  Text Interpretation: Atrial fibrillation Right axis deviation Borderline repolarization abnormality Confirmed by Ballard Bongo [phone]) on 06/14/2023 9:09:03 PM  I have reviewed the labs performed to date as well as medications administered while in observation.  Recent changes in the last 24 hours include evaluated by PT who is recommending acute inpatient rehab.  Social work looking for placement.  Plan  Current plan is for placement.    Ninetta Basket, MD 06/16/23 [phone]

## 2023-06-16 NOTE — Progress Notes (Deleted)
 Physical Therapy Treatment Patient Details Name: Beth Haynes MRN: [id number] DOB: Nov 16, 1945 Today's Date: 06/16/2023   History of Present Illness 78 y.o. female adm 5/1 presenting for left knee pain after a fall and inability to bear weight on the left leg without increased pain.  PMH includes: OSA, HTN, CHF, Afib and ICH.  X-ray: Minimally displaced oblique fracture of the proximal fibular neck.  Seen in ED at [facility] then sent home via ambulance, now returns due to difficulty managing at home.    PT Comments  Pt seen for PT tx with pt agreeable. Pt is anticipating d/c to SNF rehab today. Pt is able to complete bed mobility from ED stretcher with supervision & extra time. Elevated bed<>low chair transfer via lateral scoot to chair, squat pivot back to bed. Pt requires increased assistance with squat pivot from low to high surface 2/2 overall weakness. PT does educate pt on head/hips relationship for increased ease of movement. Recommend ongoing PT services to progress mobility as able.    If plan is discharge home, recommend the following: A lot of help with walking and/or transfers;A lot of help with bathing/dressing/bathroom;Assist for transportation;Help with stairs or ramp for entrance;Assistance with cooking/housework   Can travel by private vehicle     No  Equipment Recommendations  Other (comment) (TBD in next venue)    Recommendations for Other Services       Precautions / Restrictions Precautions Precautions: Fall Required Braces or Orthoses: Splint/Cast Splint/Cast: L lower leg Restrictions Weight Bearing Restrictions Per Provider Order: Yes LLE Weight Bearing Per Provider Order: Non weight bearing     Mobility  Bed Mobility Overal bed mobility: Needs Assistance       Supine to sit: Supervision, HOB elevated Sit to supine: Supervision, HOB elevated   General bed mobility comments: extra time, ED stretcher    Transfers Overall transfer level: Needs assistance    Transfers: Bed to chair/wheelchair/BSC       Squat pivot transfers: Max assist, Total assist (low chair>elevated ED stretcher with PT blocking R knee for added support during pivoting while on RLE)    Lateral/Scoot Transfers: Min assist (elevated bed>low chair, PT supporting LLE to ensure NWB, assisting with scooting, education re: head/hips relationship with improved ability to scoot)      Ambulation/Gait                   Stairs             Wheelchair Mobility     Tilt Bed    Modified Rankin (Stroke Patients Only)       Balance Overall balance assessment: Needs assistance Sitting-balance support: Feet supported Sitting balance-Leahy Scale: Fair Sitting balance - Comments: static sitting FAIR                                    Communication    Cognition Arousal: Alert Behavior During Therapy: WFL for tasks assessed/performed                             Following commands: Intact      Cueing Cueing Techniques: Verbal cues, Tactile cues  Exercises General Exercises - Lower Extremity Straight Leg Raises: AAROM, Supine, Strengthening, Left, 10 reps (unable to maintain full knee extension despite education to attempt)    General Comments        Pertinent  Vitals/Pain Pain Assessment Pain Assessment: No/denies pain    Home Living                          Prior Function            PT Goals (current goals can now be found in the care plan section) Acute Rehab PT Goals Patient Stated Goal: return to independent PT Goal Formulation: With patient/family Time For Goal Achievement: 06/27/23 Potential to Achieve Goals: Fair Progress towards PT goals: Progressing toward goals    Frequency    Min 2X/week      PT Plan      Co-evaluation              AM-PAC PT "6 Clicks" Mobility   Outcome Measure  Help needed turning from your back to your side while in a flat bed without using bedrails?: A  Little Help needed moving from lying on your back to sitting on the side of a flat bed without using bedrails?: A Little Help needed moving to and from a bed to a chair (including a wheelchair)?: A Lot Help needed standing up from a chair using your arms (e.g., wheelchair or bedside chair)?: A Lot Help needed to walk in hospital room?: Total Help needed climbing 3-5 steps with a railing? : Total 6 Click Score: 12    End of Session Equipment Utilized During Treatment: Gait belt Activity Tolerance: Patient tolerated treatment well (limited by EMS arriving to take pt to SNF rehab) Patient left: in bed;with call bell/phone within reach (EMTs in room) Nurse Communication: Mobility status PT Visit Diagnosis: Other abnormalities of gait and mobility (R26.89);Difficulty in walking, not elsewhere classified (R26.2);Muscle weakness (generalized) (M62.81)     Time: 1610-9604 PT Time Calculation (min) (ACUTE ONLY): 11 min  Charges:    $Therapeutic Activity: 8-22 mins PT General Charges $$ ACUTE PT VISIT: 1 Visit                     Beth Haynes, PT, DPT 06/16/23, 10:48 AM   Beth Haynes 06/16/2023, 10:47 AM

## 2023-07-03 IMAGING — CT CT ANGIO HEAD-NECK (W OR W/O PERF)
2 of 12 series · 4 of 33 positions shown · IV contrast (iopamidol)
Comparison: None.

CLINICAL DATA: Dizziness

EXAM:
CT ANGIOGRAPHY HEAD AND NECK
TECHNIQUE: Multidetector CT imaging of the head and neck was performed using
the standard protocol during bolus administration of intravenous
contrast. Multiplanar CT image reconstructions and MIPs were
obtained to evaluate the vascular anatomy. Carotid stenosis
measurements (when applicable) are obtained utilizing NASCET
criteria, using the distal internal carotid diameter as the
denominator.
CONTRAST:  100mL TWPSS8-FXX IOPAMIDOL (TWPSS8-FXX) INJECTION 61%

[Series 12: brain 3.00 hr40 s3 sag without ibhc · sagittal · non-contrast · 0.30mm/px · 1 of 59 slices shown]
[im 30/59  soft-tissue]
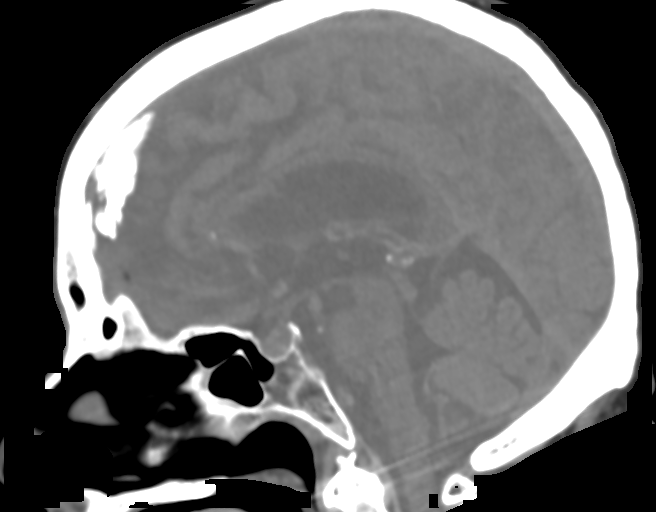

[Series 17: cta head & neck 1.00 hv48 cor thin mips · axial · 0.60mm/px · z∈[-663,-494]mm · 3 of 264 slices shown]
[im 1/264  soft-tissue]
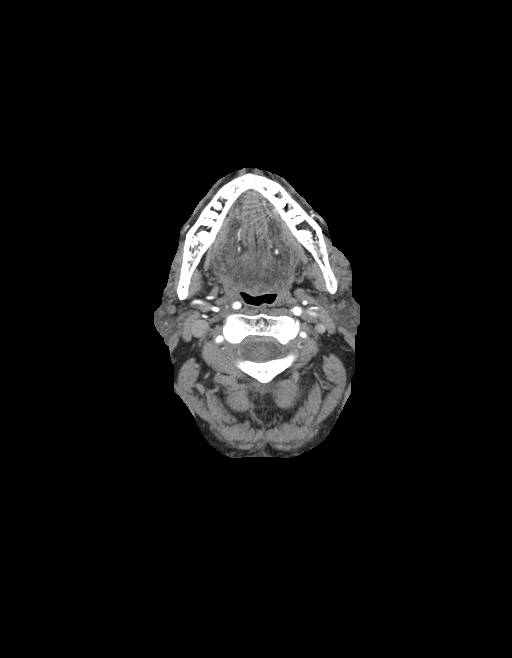
[im 132/264  bone]
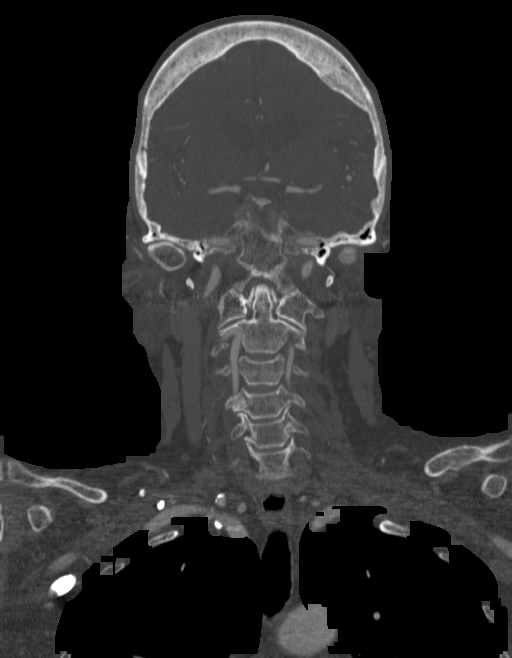
[im 264/264  soft-tissue]
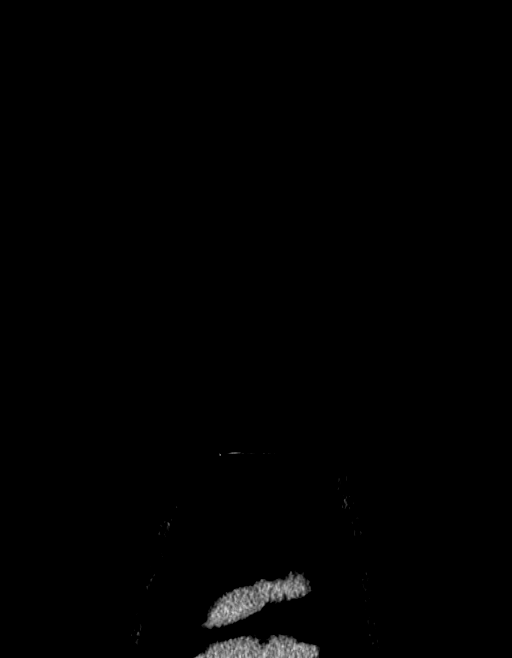

[4 of 33 positions shown; findings below may reference images not displayed]

FINDINGS: CT HEAD FINDINGS

Brain: There is no mass, hemorrhage or extra-axial collection. There
is generalized atrophy without lobar predilection. Old small vessel
infarcts of the right basal ganglia and thalamus are unchanged.
Brain parenchyma is otherwise normal.

Skull: The visualized skull base, calvarium and extracranial soft
tissues are normal.

Sinuses/Orbits: No fluid levels or advanced mucosal thickening of
the visualized paranasal sinuses. No mastoid or middle ear effusion.
The orbits are normal.

CTA NECK FINDINGS

SKELETON: There is no bony spinal canal stenosis. No lytic or
blastic lesion.

OTHER NECK: Normal pharynx, larynx and major salivary glands. No
cervical lymphadenopathy. 1.8 cm nodule of the right thyroid gland.

UPPER CHEST: No pneumothorax or pleural effusion. No nodules or
masses.

AORTIC ARCH:

There is no calcific atherosclerosis of the aortic arch. There is no
aneurysm, dissection or hemodynamically significant stenosis of the
visualized portion of the aorta. Conventional 3 vessel aortic
branching pattern. The visualized proximal subclavian arteries are
widely patent.

RIGHT CAROTID SYSTEM: Normal without aneurysm, dissection or
stenosis.

LEFT CAROTID SYSTEM: Normal without aneurysm, dissection or
stenosis.

VERTEBRAL ARTERIES: Left dominant configuration. Both origins are
clearly patent. There is no dissection, occlusion or flow-limiting
stenosis to the skull base (V1-V3 segments).

CTA HEAD FINDINGS

POSTERIOR CIRCULATION:

--Vertebral arteries: Normal V4 segments.

--Inferior cerebellar arteries: Normal.

--Basilar artery: Normal.

--Superior cerebellar arteries: Normal.

--Posterior cerebral arteries (PCA): Normal.

ANTERIOR CIRCULATION:

--Intracranial internal carotid arteries: Normal.

--Anterior cerebral arteries (ACA): Normal. Both A1 segments are
present. Patent anterior communicating artery (a-comm).

--Middle cerebral arteries (MCA): Normal.

VENOUS SINUSES: As permitted by contrast timing, patent.

ANATOMIC VARIANTS: None

Review of the MIP images confirms the above findings.
IMPRESSION: 1. No emergent large vessel occlusion or hemodynamically significant
stenosis of the head or neck.
2. Old small vessel infarcts and generalized atrophy.
3. 1.8 cm incidental right thyroid nodule. Recommend thyroid US.
Reference: [HOSPITAL]. [DATE]): 143-50

## 2023-08-01 IMAGING — US US THYROID
1 series · 13 of 25 positions shown · non-contrast
Comparison: 01/16/2021

CLINICAL DATA: Thyroid nodule by CT

EXAM:
THYROID ULTRASOUND
TECHNIQUE: Ultrasound examination of the thyroid gland and adjacent soft
tissues was performed.

[Series 1: us thyroid · 0.06mm/px · 13 of 51 slices shown]
[im 1/51]
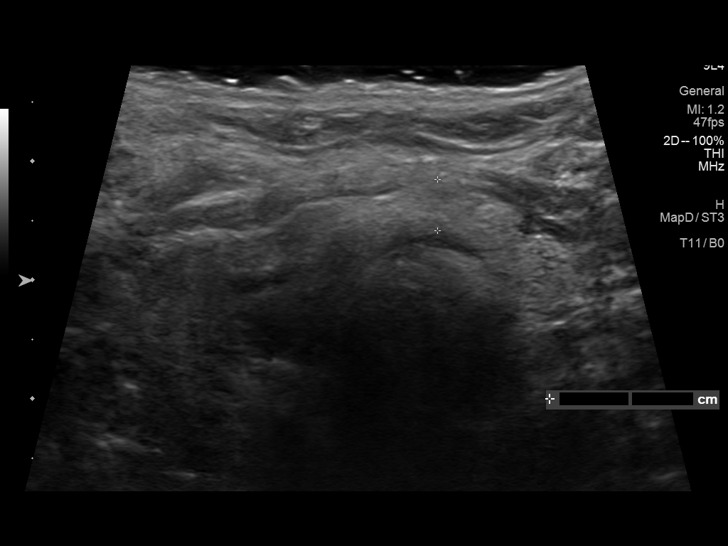
[im 5/51]
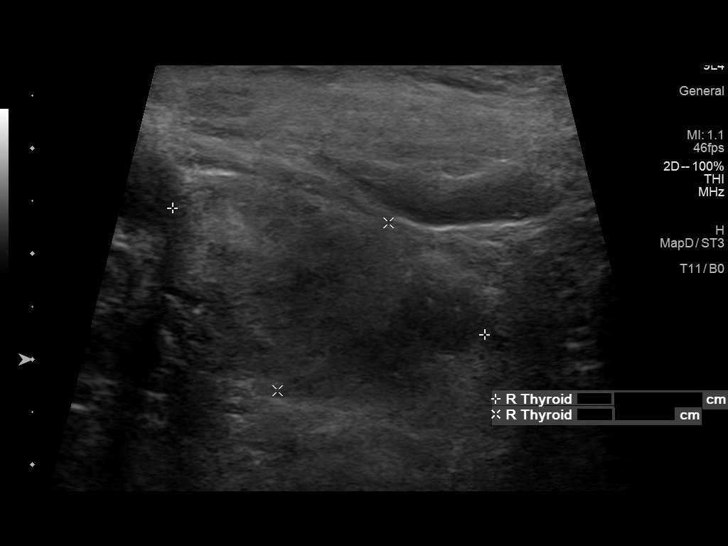
[im 9/51]
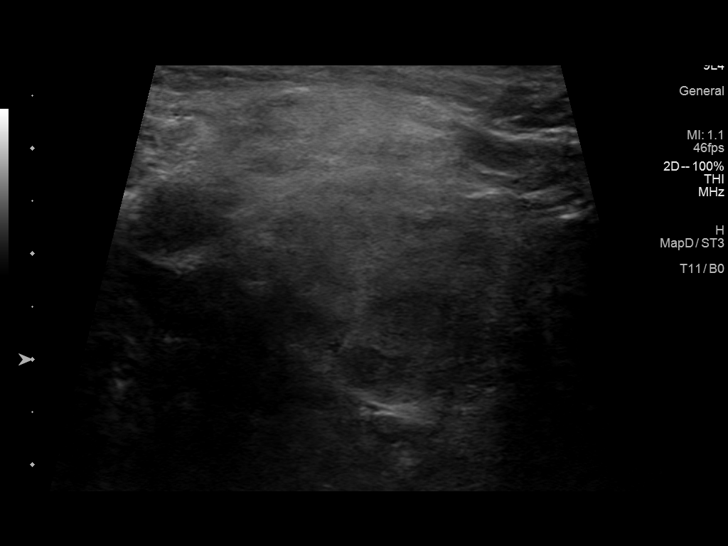
[im 13/51]
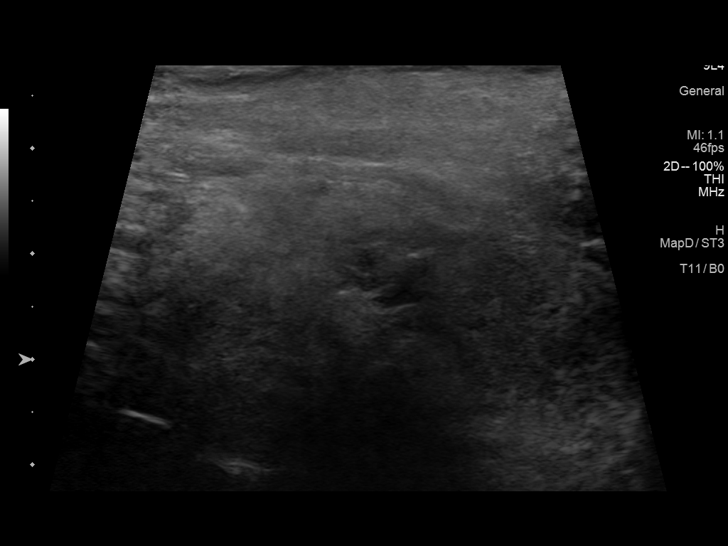
[im 17/51]
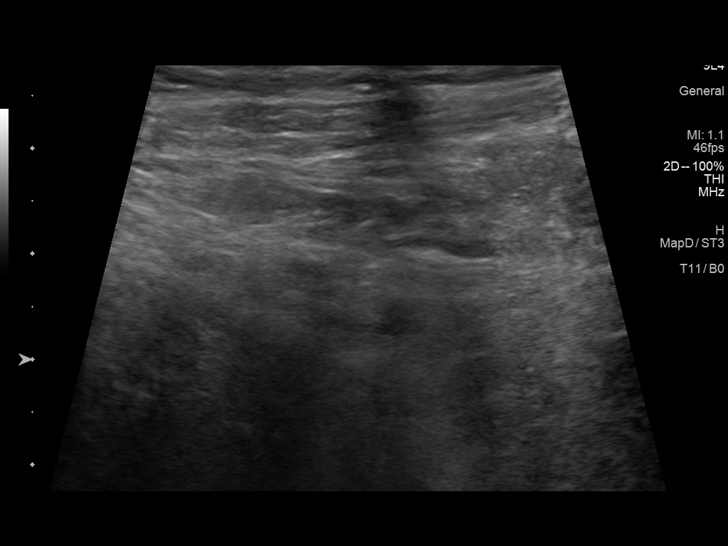
[im 21/51]
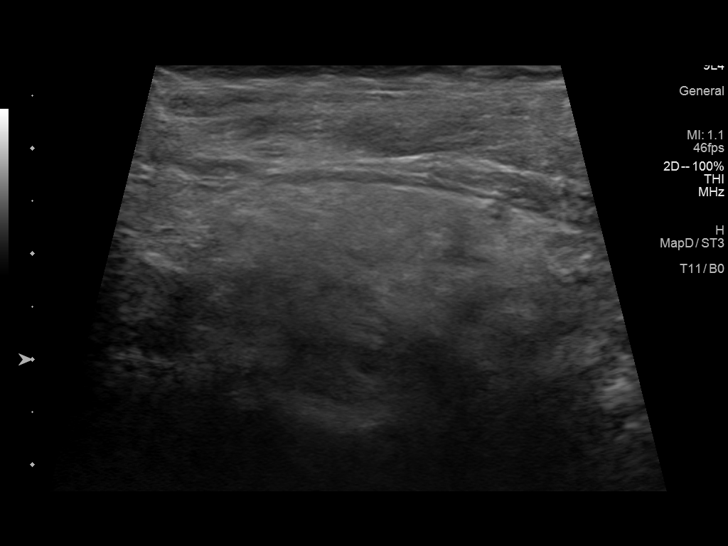
[im 26/51]
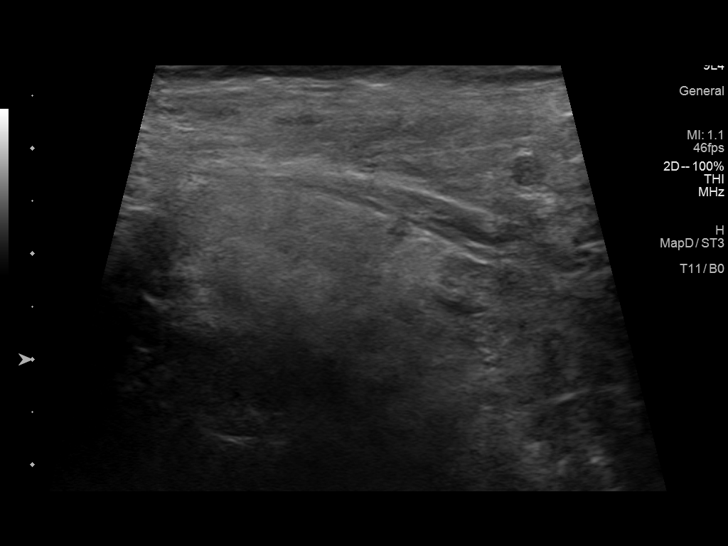
[im 30/51]
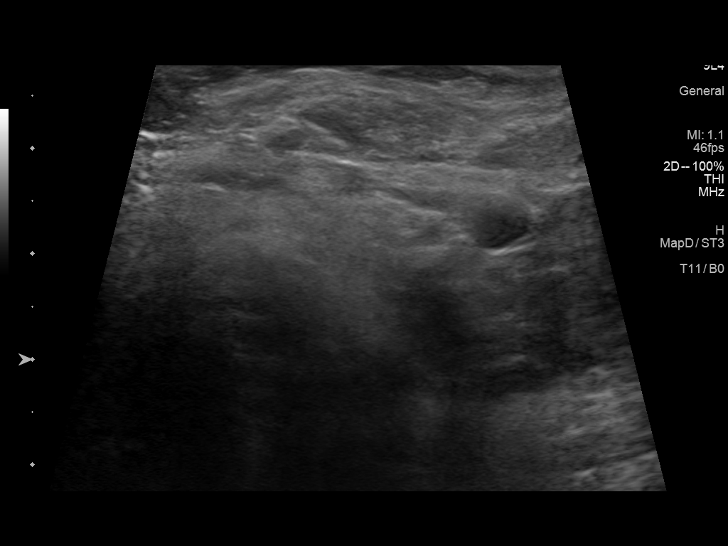
[im 34/51]
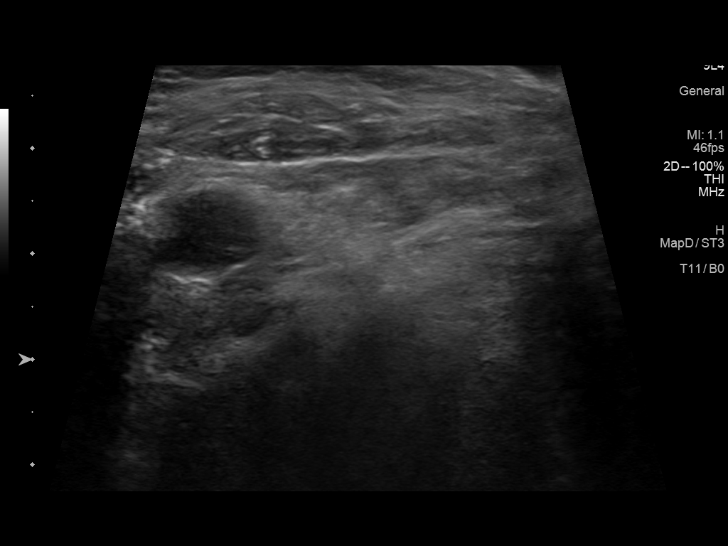
[im 38/51]
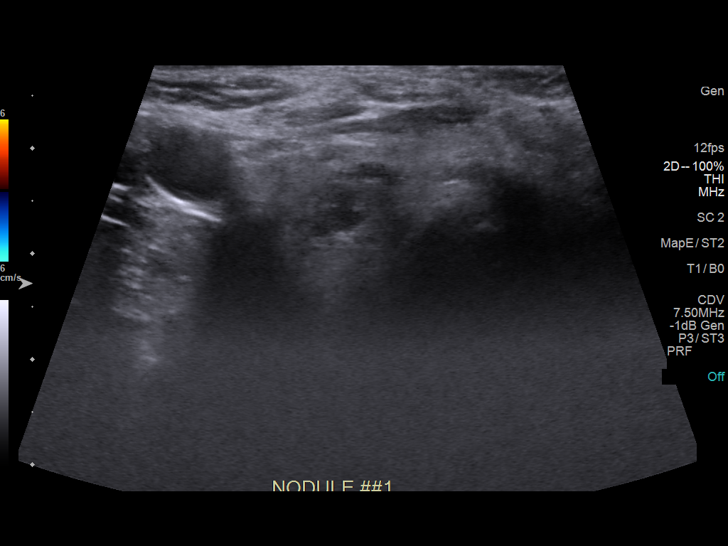
[im 42/51]
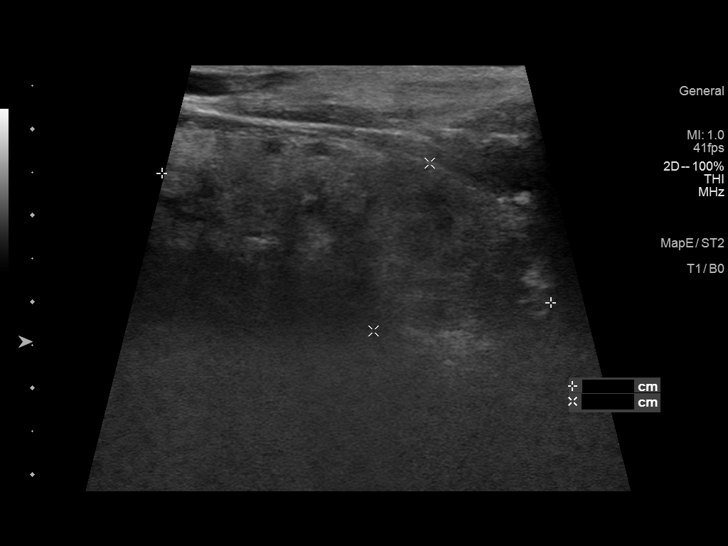
[im 46/51]
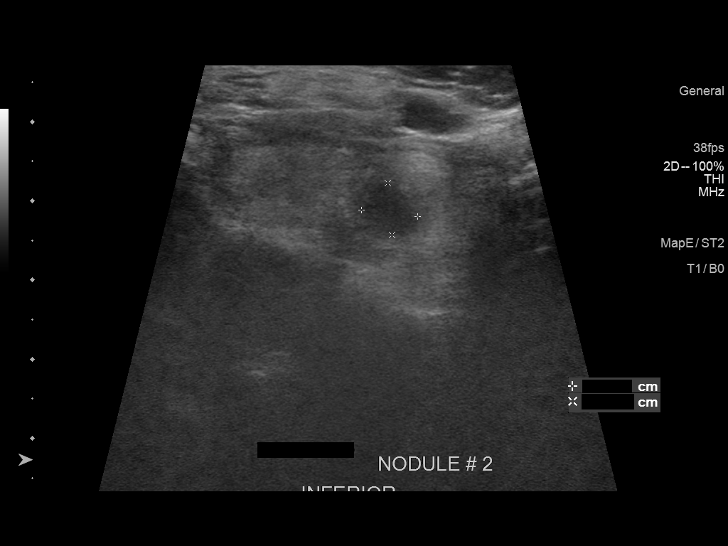
[im 51/51]
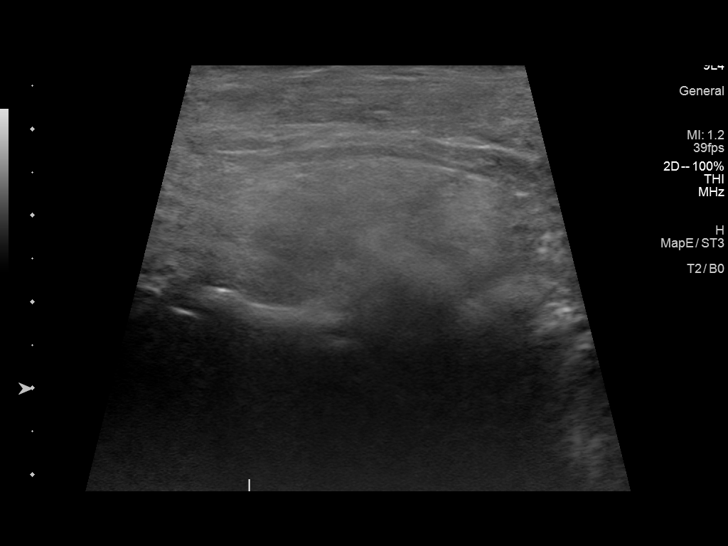

[13 of 25 positions shown; findings below may reference images not displayed]

FINDINGS: Parenchymal Echotexture: Moderately heterogenous

Isthmus: 4 mm

Right lobe: 4.8 x 2.1 x 2.1 cm

Left lobe: 3.7 x 1.6 x 1.1 cm

_________________________________________________________

Estimated total number of nodules >/= 1 cm: 0

Number of spongiform nodules >/=  2 cm not described below (TR1): 0

Number of mixed cystic and solid nodules >/= 1.5 cm not described
below (TR2): 0

_________________________________________________________

Limited exam because of body habitus.

Nonspecific thyroid heterogeneity and background pseudo nodularity.
Subcentimeter right thyroid hypoechoic and mixed cystic/solid
nodules noted, all measuring 9 mm or less. These would not meet
criteria for any biopsy or follow-up and are fully described by TI
rads criteria. Right inferior thyroid appears heterogeneous and
enlarged with a pseudo nodular appearance favored to represent
goiter by ultrasound.
IMPRESSION: Heterogeneous thyroid, nonspecific but favored to represent goiter
and pseudo nodularity of the right inferior thyroid accounting for
the CTA finding.

Incidental subcentimeter right thyroid nodules noted.

The above is in keeping with the ACR TI-RADS recommendations - [HOSPITAL] 3594;[DATE].

## 2023-08-26 NOTE — Care Plan (Signed)
 Plan of Care  Will re-start home Eliquis  this morning. Stopped ASA and Lovenox subcutaneous. We will arrange for outpatient follow up within 2-4 weeks.

## 2023-08-29 NOTE — Nursing Note (Signed)
 This RN called and gave report to Marjorie, Charity fundraiser at [facility] on 08/29/2023

## 2023-08-29 NOTE — Progress Notes (Signed)
 Case Management Update  Date: 08/29/2023   Time: 8:42 AM   Patient Type: Inpatient   Spoke with the patient's husband on the phone when he called, he was inquiring about the DC plan, I explained that her Ins is pending to go to [place] and that we hope to have it today, I assured him that I would give him a call as soon as I get approval  Post Acute Placement Status: Review currently in progress.  Case Management Coordination Status: Coordination In-Progress    Anticipated Discharge Location: Skilled Nursing Facility - short-term rehab  If Plan A discharging location is not feasible: Potential Plan B: Skilled Nursing Facility - short-term rehab         Beth JINNY Bernheim, RN

## 2023-08-29 NOTE — Care Plan (Signed)
  Problem: Safety: Goal: Will remain free from falls by discharge Description: Will remain free from falls by discharge Outcome: Progressing   Problem: Urinary Incontinence Goal: Perineal skin integrity is maintained or improved Description: Assess genitourinary system, perineal skin, labs (urinalysis), and history of incontinence to include past management, aggravating, and alleviating factors.  Collaborate with interdisciplinary team and initiate plans and interventions as needed. Outcome: Progressing

## 2023-08-29 NOTE — Progress Notes (Signed)
 Case Management Discharge Note        CSN: [id number] DOB: December 01, 1945 Service: General Medicine Location: 725/01  Patient Class: Inpatient  DC Disposition: : Skilled Nursing Facility  Discharge DC Disposition: : Skilled Nursing Facility Placement Facility Type: Skilled Nursing Facility Skilled Nursing - Assisted Living Facility: Other local [facility]) Discharge Transport: Ambulance Discharge Transport Agency [place]: [facility]  [phone])  Discharge Referrals [place] geographical local area[place]ounty list shared with patient/family: Yes Date chosen geographical local area/county list shared with patient/family: 08/24/23 Post-Acute Provider Form Completed: Yes Case closed, patient/family agree with disposition plan: Yes  Patient to DC to [facility] today, the room wont be ready until after 3,  Howard the patient;s husband has been made aware    Post Acute Placement Status: Coordination complete.   Case Management Coordination Status: Coordination Complete     Deliliah J Louvet, RN

## 2023-08-29 NOTE — Discharge Summary (Addendum)
 INTERNAL MEDICINE HOSPITALIST [facility]  DISCHARGE SUMMARY:  Identifying Information:  Beth Haynes Jan 21, 1946 [id number]  Admit date: 08/23/2023  Discharge date: 08/29/2023   Discharge Service: [facility] Hospitalist  Discharge Attending Physician:Milton Sheena Butt, MD  Discharge to: Final discharge disposition not confirmed  Discharge Diagnoses (all POA): Principal Problem:   Stroke of right basal ganglia    (CMD) Active Problems:   Permanent atrial fibrillation (HCC)   Polycythemia Resolved Problems:   Bradycardia   Hospital Course:  Patient is a 78 year old woman who was transferred from [facility] nursing facility to [facility] ED with left hemiparesis.  Stroke protocol CTA showed chronic infarct, patient was deemed not to be a candidate for thrombolytic therapy.  MRI brain showed acute subcortical infarct within the right precentral gyrus and advanced cerebral white matter disease and chronic lacunar infarcts within the right putamen and thalamus.  Patient has a history of atrial fibrillation and was on apixaban  prior to admission.  Neurology was consulted and recommended continuation of apixaban  without antiplatelet agents.    Patient was slightly confused when first admitted but her sensorium cleared.  On 717 and 718, she was alert, oriented x 3 and erudite.  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  Patient was noted to be polycythemic and labs for Jak 2 mutation were sent.  I asked patient to follow-up with Dr. Madison after discharge regarding polycythemia.   ______________________________________________________________________   Discharge Day Services: BP 131/80 (BP Location: Right arm, Patient Position: Lying)   Pulse 76   Temp 97.3 F (36.3 C) (Oral)   Resp 18   Ht 1.6 m (5' 3)   Wt 70.9 kg (156 lb 4.9 oz)   SpO2 96%   BMI 27.69 kg/m  Pt seen on the day of discharge and determined appropriate for  discharge.   Condition at Discharge: fair  Length of Discharge: I spent 45 mins in the discharge of this patient. _____________________________________________________________________________ Discharge Medications: Patient Instructions:    Medication List     START taking these medications    acetaminophen  325 mg tablet Commonly known as: TYLENOL  Take 2 tablets (650 mg total) by mouth every 6 (six) hours as needed (Fever GREATER THAN 100.4 F(38 C)).   atorvastatin 40 mg tablet Commonly known as: LIPITOR Take 1 tablet (40 mg total) by mouth at bedtime.   lisinopriL 20 mg tablet Commonly known as: PRINIVIL Take 1 tablet (20 mg total) by mouth daily.   polyethylene glycol 17 gram packet Commonly known as: GLYCOLAX  Take 17 g by mouth daily for 14 days.       CHANGE how you take these medications    metoprolol  succinate 100 mg 24 hr tablet Commonly known as: TOPROL  XL Take 1 tablet (100 mg total) by mouth at bedtime. What changed:  medication strength how much to take when to take this Another medication with the same name was removed. Continue taking this medication, and follow the directions you see here.       CONTINUE taking these medications    apixaban  5 mg Tab Commonly known as: ELIQUIS  Take 1 tablet (5 mg total) by mouth 2 (two) times a day.   cholecalciferol 1,250 mcg (50,000 unit) capsule Commonly known as: VITAMIN D3 TAKE 1 CAPSULE BY MOUTH 1 TIME WEEKLY   dilTIAZem  180 mg 24 hr capsule Commonly known as: CARDIZEM  CD Take 1 capsule (180 mg total) by mouth daily.   docusate sodium  100 mg  capsule Commonly known as: COLACE Take 100 mg by mouth daily.       STOP taking these medications    azelastine 137 mcg (0.1 %) nasal spray Commonly known as: ASTELIN   cetirizine 10 mg tablet Commonly known as: ZyrTEC   donepeziL  5 mg tablet Commonly known as: ARICEPT    fluticasone propionate 50 mcg/spray nasal spray Commonly known as: Flonase  Allergy Relief   hydrocortisone  2.5 % cream   solifenacin 5 mg tablet Commonly known as: VESICARE   triamcinolone acetonide 0.1 % cream Commonly known as: KENALOG         Where to Get Your Medications     Information about where to get these medications is not yet available   Ask your nurse or doctor about these medications acetaminophen  325 mg tablet apixaban  5 mg Tab atorvastatin 40 mg tablet lisinopriL 20 mg tablet metoprolol  succinate 100 mg 24 hr tablet polyethylene glycol 17 gram packet     _____________________________________________________________________________ Pending Test Results: @PRINTGROUPPENDLAB @  Most Recent Labs:     Lab Results  Component Value Date   WBC 5.40 08/26/2023   HGB 16.4 (H) 08/26/2023   HCT 47.3 (H) 08/26/2023   PLT 194 08/26/2023    Lab Results  Component Value Date   CO2 24 08/26/2023   BUN 22 08/26/2023   GLUCOSE 111 (H) 08/26/2023   CREATININE 0.72 08/26/2023   CALCIUM  9.5 08/26/2023   ALBUMIN 3.2 (L) 08/23/2023   AST 14 08/23/2023   ALT 8 08/23/2023    Lab Results  Component Value Date   NA 142 08/26/2023   K 3.8 08/26/2023   CL 110 (H) 08/26/2023   CO2 24 08/26/2023   BUN 22 08/26/2023   CREATININE 0.72 08/26/2023   CALCIUM  9.5 08/26/2023   MG 2.0 08/26/2023    Lab Results  Component Value Date   BILITOT 0.6 08/23/2023   PROT 5.1 (L) 08/23/2023   ALBUMIN 3.2 (L) 08/23/2023   ALT 8 08/23/2023   AST 14 08/23/2023    Lab Results  Component Value Date   INR 1.8 (H) 08/23/2023   Hospital Radiology:  Transthoracic echo (TTE) complete Result Date: 08/25/2023                                                   [facility]                                                 [facility]                                                 [place]                                                   [facility]  [facility]                                                   Cardiac                                                  Ultrasound-                                                 [place],                                                [facility]                                                    Bldg                                                  [address]                                                 [employer]                                                  [place] [address]                                  Transthoracic Echocardiogram Report Name  TASHEMA, TILLER                                Study Date  08-25-2023              Height  62 in MRN  [id number]                                   Patient [facility]        Weight  162 lb DOB  1946/01/16                                 Gender  Female                      BSA  1.7 m2 Age  78 yrs                                     Ethnicity  1                        BP  156-108 mmHg Reason For Study  Stroke, follow up Ordering Physician  NSIAH, ISSAKA ODOI          Performed By  ETHEL, MADISON Referring Physician  NSIAH, ISSAKA ODOI - - PROCEDURE A complete two-dimensional transthoracic echocardiogram was performed  2D, M-mode, spectral and color flow Doppler . Image Quality  Technically adequate. - SUMMARY The left ventricular size is normal. There is mild concentric left ventricular hypertrophy. Left ventricular systolic function is normal. LV ejection fraction = 65-70%. No segmental wall motion abnormalities seen in the left ventricle The right ventricle is normal in size and function. The left atrium is severely dilated. There is mild tricuspid regurgitation. No pulmonary hypertension. The aortic sinus is normal size. IVC size was mildly dilated. There is no pericardial effusion. There is no comparison study available. - FINDINGS LEFT VENTRICLE The left ventricular size is normal. There is mild concentric left ventricular hypertrophy. Left ventricular systolic function is normal.  LV ejection fraction = 65-70%. Left ventricular filling pattern is indeterminate. No segmental wall motion abnormalities seen in the left ventricle. - RIGHT VENTRICLE The right ventricle is normal in size and function. LEFT ATRIUM The left atrium is severely dilated. RIGHT ATRIUM Right atrial size is normal. - AORTIC VALVE There is aortic valve sclerosis. The aortic valve opens well. There is no aortic stenosis. There is trace aortic regurgitation. - MITRAL VALVE There is mild mitral annular calcification. There is trace mitral regurgitation. - TRICUSPID VALVE The tricuspid valve is not well visualized. There is mild tricuspid regurgitation. Estimated right ventricular systolic pressure is 31 mmHg. No pulmonary hypertension. - PULMONIC VALVE The pulmonic valve is not well visualized. Trace pulmonic valvular regurgitation. - ARTERIES The aortic sinus is normal size. The ascending aorta is normal size. - VENOUS IVC size was mildly dilated. - EFFUSION There is no pericardial effusion. - - MMode-2D Measurements & Calculations IVSd  1.3 cm               LA dim  5.3 cm             ESV MOD-sp4   24.1 ml     asc Aorta Diam LVIDd  3.6 cm              EDV MOD-sp4   54.9 ml      EDV MOD-sp2   63.8 ml     3.3 cm LVPWd  1.3 cm                                         ESV MOD-sp2   21.3 ml LVIDs  2.6 cm  _________________________________________________________________________________ LVOT diam  1.8 cm          SV MOD-sp4   30.8 ml       EF A4C  56.2 %            LA ESV  BP                             SI MOD-sp4   17.6 ml-m2                              114.0 ml           _________________________________________________________________________________ LA ESV Index  A2C          LA ESV Index  A4C          LA ESV Index  BP          SV A4C  30.9 ml 61.1 ml-m2                 67.4 ml-m2                 65.1 ml-m2 Doppler Measurements & Calculations MV E max vel             MV dec time        SV LVOT   37.8 ml              LV  V1 VTI  14.1 cm 100.0 cm-sec             0.25 sec           Ao V2 max  116.7 cm-sec Med Peak E  Vel                             Ao max PG  5.5 mmHg 5.6 cm-sec                                  Ao V2 mean  87.1 cm-sec Lat Peak E  Vel                             Ao mean PG  3.3 mmHg 11.6 cm-sec                                 Ao V2 VTI  25.1 cm E-Lat E`  8.6 E-Med E`  18.0                              AVA  VTI   1.5 cm2           _________________________________________________________________________________ TR max vel  241.6 cm-sec RAP systole        AS Dimensionless Index  VTI    AVAi VTI  cm^2-m^2 TR max PG  23.4 mmHg     8.0 mmHg           0.56 RVSP TR   31.4 mmHg  0.86 cm2           _________________________________________________________________________________ SV index LVOT  21.6 ml-m2 ______________________________________________________________________________ Reading Physician                   MD Carlin Hoe, MD, [phone] 08-25-2023 12 28 PM  FL Esophagus Barium Swallow With Video And Speech Result Date: 08/25/2023 CLINICAL DATA:  Dysphagia. EXAM: MODIFIED BARIUM SWALLOW TECHNIQUE: Different consistencies of barium were administered orally to the patient by the Speech Pathologist. Imaging of the pharynx was performed in the lateral projection. FLUOROSCOPY TIME:  Radiation Exposure Index (as provided by the fluoroscopic device): 8.5 mGy Kerma COMPARISON:  None Available. FINDINGS: Modified barium swallow was performed by the speech pathologist. Radiologist was not involved with this exam. Please refer to the Speech Pathology report for results and recommendations. Trace penetration and aspiration noted.   Please refer to the Speech Pathologists report for complete details and recommendations. Electronically Signed   By: JONETTA Faes M.D.   On: 08/25/2023 11:00   US  Carotid Doppler Bilateral Result Date: 08/24/2023                                                    [facility]                                                 [facility]                                                 [facility]                                                 [place]                                                  Heart and                                                  Vascular  [address]                                                 [place]                                                  [place] [address]                                       Carotid Ultrasound Report Name  LUVINA, POIRIER             Study Date  08-24-2023 09 45 AM MRN  [id number]                Patient Location  [facility] DOB  05-23-1945              Gender  Female                              Height  62 in Age  85 yrs                  Ethnicity  1                                Weight  162 lb Ordering Physician  NSIAH, ISSAKA ODOI Referring Physician  NSIAH, ISSAKA ODOI Performed By  SONDA BRUCKNER Interpretation Summary Color flow duplex imaging exam of the bilateral cervical carotid systems reveals minimal plaque of the bilateral internal carotid arteries consistent with 1-39% diameter reduction. Antegrade flow bilateral vertebral arteries. Measurements and Calculations                                          Right    No Laterality     Left    Unit         Prox CCA PSV                      66.5                      70.2    cm-sec         Prox CCA EDV                      10.6                      13.7    cm-sec         Mid CCA PSV  69.6                     -52.8    cm-sec         Mid CCA EDV                       15.5                     -13.7    cm-sec         Dist CCA PSV                      56.5                     -42.2    cm-sec         Dist CCA EDV                       13.7                     -11.2    cm-sec         Prox ECA PSV                     -63.4                              cm-sec         Prox ICA PSV                     -28.0                     -30.6    cm-sec         Prox ICA EDV                     -11.2                     -10.7    cm-sec         Mid ICA PSV                      -42.5                      53.1    cm-sec         Mid ICA EDV                      -12.6                      20.3    cm-sec         Dist ICA PSV                      74.4                     -39.5    cm-sec         Dist ICA EDV                      23.7                     -  14.0    cm-sec         Vertebral A PSV                  -50.7                     -28.8    cm-sec         Vertebral A EDV                  -14.0                      -5.6    cm-sec Right Findings No visible stenosis in the CCA, ICA or ECA. No elevated velocities. Antegrade vertebral flow. Left Findings No visible stenosis in the CCA, ICA or ECA. No elevated velocities. Antegrade vertebral flow. ______________________________________________________________________________ Electronically signed by MD Alverna JONELLE Sieving, MD, [phone]   on   08-24-2023 05 38 PM  XR Ankle Minimum 3 Views Left Result Date: 08/24/2023 CLINICAL DATA:  Proximal fibular fracture with ankle pain. EXAM: LEFT ANKLE COMPLETE - 3+ VIEW COMPARISON:  06/09/2023. FINDINGS: No acute fracture or dislocation is seen. There is bony deformity of the medial malleolus unchanged from the prior exam. Fixation hardware is noted in the distal fibula without evidence of hardware loosening. There is no dislocation about the ankle. A large calcaneal spur is present. Mild soft tissue swelling is noted about the ankle.   1. No acute fracture or dislocation. 2. Fixation hardware in the distal fibula without evidence of hardware loosening. 3. Stable bony deformity of the medial malleolus. Electronically Signed   By: Leita Birmingham M.D.   On: 08/24/2023 12:41   XR Knee  4+ Views Left Result Date: 08/24/2023 CLINICAL DATA:  Left proximal fibular fracture. EXAM: LEFT KNEE - COMPLETE 4+ VIEW COMPARISON:  06/09/2023. FINDINGS: Diffusely decreased mineralization of the bones is noted. There is redemonstration of a fracture of the proximal fibular shaft with bone callus formation. The remaining bony structures are intact and there is no dislocation. No joint effusion is seen. Mild degenerative changes are present in the patellofemoral compartment. The soft tissues are within normal limits.   Fracture of the proximal femoral diaphysis with bone callus formation. Electronically Signed   By: Leita Birmingham M.D.   On: 08/24/2023 12:38   ECG 12 lead Result Date: 08/24/2023 Ventricular Rate                   49        BPM                 QRS Duration                       82        ms                  Q-T Interval                       596       ms                  QTC Calculation Bazett             538       ms                  Calculated R Axis  66        degrees             Calculated T Axis                  10        degrees             Atrial fibrillation with slow ventricular response Nonspecific T wave changes No previous ECGs available Confirmed by Maralee Standing  [id number]  on 08-24-2023 9 05 30 AM  MRI Brain WO Contrast Result Date: 08/23/2023 CLINICAL DATA:  CVA. EXAM: MRI HEAD WITHOUT CONTRAST TECHNIQUE: Multiplanar, multiecho pulse sequences of the brain and surrounding structures were obtained without intravenous contrast. COMPARISON:  CT angiogram of the head and neck dated August 23, 2023. FINDINGS: Brain: There is focal restricted diffusion present in the subcortical white matter of the right posterior frontal lobe seen on images 19 through 21 of series 6 there is no restricted diffusion evident elsewhere. There is age related cerebral volume loss. There is extensive periventricular and deep cerebral white matter disease. There are chronic lacunar infarcts  within the right putamen and right thalamus. There is no evidence of acute hemorrhage, mass or hydrocephalus. There are numerous foci of hemosiderin staining present within the right basal ganglia and periventricular white matter. There are also numerous foci present within the subcortical white matter of the cerebral hemispheres and within the cerebellar nuclei. Vascular: Normal flow voids. Skull and upper cervical spine: Normal marrow signal. Sinuses/Orbits: Clear paranasal sinuses. Status post right lens replacement. Other: None.   1. Acute subcortical infarct within the right precentral gyrus. 2. Advanced cerebral white matter disease with chronic lacunar infarcts within the right putamen and thalamus. 3. Numerous foci of hemosiderin staining throughout the brain, particularly involving the right basal ganglia and periventricular white matter. Findings may represent underlying amyloid angiopathy. Electronically Signed   By: Evalene Coho M.D.   On: 08/23/2023 18:19   CT Code Stroke W Perfusion Result Date: 08/23/2023 CLINICAL DATA:  Neuro deficit, acute, stroke suspected. Left arm weakness and facial droop. EXAM: CT HEAD WITHOUT CONTRAST CT ANGIOGRAPHY HEAD AND NECK CT PERFUSION BRAIN TECHNIQUE: Multidetector CT imaging of the head and neck was performed using the standard protocol during bolus administration of intravenous contrast. Multiplanar CT image reconstructions and MIPs were obtained to evaluate the vascular anatomy. Carotid stenosis measurements (when applicable) are obtained utilizing NASCET criteria, using the distal internal carotid diameter as the denominator. Multiphase CT imaging of the brain was performed following IV bolus contrast injection. Subsequent parametric perfusion maps were calculated using RAPID software. RADIATION DOSE REDUCTION: This exam was performed according to the departmental dose-optimization program which includes automated exposure control, adjustment of the mA  and/or kV according to patient size and/or use of iterative reconstruction technique. CONTRAST:  100 mL iohexoL (OMNIPAQUE) 350 mg iodine/mL injection (MDV) 100 mL COMPARISON:  CT angiogram of the head dated January 16, 2021. FINDINGS: CT HEAD FINDINGS Brain: Age-related cerebral volume loss. Chronic lacunar infarcts within the right putamen and right thalamus. Small chronic lacunar infarct also noted within the left thalamus. Moderate periventricular and deep cerebral white matter disease. No evidence of hemorrhage, mass or acute cortical infarct. Vascular: Moderate calcific atheromatous disease involving the carotid siphons and vertebral arteries. Skull: Intact and unremarkable. Sinuses/Orbits: No acute process. Other: None. ASPECTS [facility] Stroke Program Early CT Score) - Ganglionic level infarction (caudate, lentiform nuclei, internal capsule, insula, M1-M3 cortex): 7. - Supraganglionic infarction (M4-M6 cortex):  3. Total score (0-10 with 10 being normal): 10. Review of the MIP images confirms the above findings CTA NECK FINDINGS Aortic arch: Mild calcific plaque. The brachiocephalic artery is widely patent. Right carotid system: Common carotid and internal carotid arteries are normal in caliber. The internal carotid artery is tortuous. Left carotid system: Mild calcific plaque within the left carotid bulb. The internal carotid artery is tortuous. There is no appreciable stenosis. Vertebral arteries: The right vertebral artery is dominant. Both vertebral arteries are normal in caliber throughout the neck. There is moderate calcific plaque within the V4 segments of both vertebral arteries. Skeleton: Mild to moderate degenerative changes.  No lesions. Other neck: Negative. Upper chest: No acute process. Review of the MIP images confirms the above findings CTA HEAD FINDINGS Anterior circulation: Calcific plaque within the carotid siphons. There is mild to moderate stenosis of the communicating segments  bilaterally. No flow-limiting stenosis. Moderate to severe stenosis of the distal M1 segment of the left middle cerebral artery. The M2 branches are normal in caliber. Mild stenosis of the right M1 segment. No large vessel occlusion or flow-limiting stenosis. Posterior circulation: Moderate calcific plaque within the V4 segments of the vertebral arteries bilaterally with moderate stenosis on the right and moderate to severe stenosis on the left. The basilar artery is normal in caliber. There is moderate to severe stenosis of the P2 segment of the left posterior cerebral artery there is mild to moderate stenosis of the right P2 segment. The cerebellar arteries are patent. Venous sinuses: Patent. Anatomic variants: None. Delayed phase: No change. Review of the MIP images confirms the above findings CT Brain Perfusion Findings: CBF (<30%) Volume: 0mL Perfusion (Tmax>6.0s) volume: 5mL Mismatch Volume: 5mL Infarction Location:Ischemia at the left frontal parietal junction   1. Chronic lacunar infarcts within the right putamen and thalamus. Aspects score is 10. 2. No apparent large vessel occlusion. Moderate to severe stenosis of the V4 segments of vertebral arteries and moderate stenosis of the P2 segment of the left posterior cerebral artery. 3. Moderate to severe stenosis of the distal M1 segment of the left middle cerebral artery. 4. 5 mm volume of diminished perfusion at the left frontal parietal junction. These results were called by telephone at the time of interpretation on 08/23/2023 at 3:43 p.m. and 4:13 pm to provider Dr. FAIRY BRAIN , who verbally acknowledged these results. Electronically Signed   By: Evalene Coho M.D.   On: 08/23/2023 16:29    _____________________________________________________________________________ Discharge Instructions:   Discharge Orders     Full Code     Lifting Limits:     Details:    Lifting Limits: No lifting limits   Ambulatory referral to Neurology      Details:    Reason for Referral: Stroke   Comments: R MCA embolic stroke in s/o Afib   Ambulatory referral to Hematology / Oncology     Details:    Reason for Referral: Classical Hematology   Referring for Leukemia?: No   Referring for Transplant or Cell Therapy?: No   Comments: Patient s/p CVA while on apixaban  is noted to have polycythemia.  JAK2 workup pending       @PATFUTUREAPPT @

## 2023-08-29 NOTE — Care Plan (Signed)
  Problem: Health Behavior: Goal: MCB Ability to state ways to decrease the risk of falls will be met by discharge Description: Ability to state ways to decrease the risk of falls will improve by discharge Outcome: Progressing   Problem: Safety: Goal: Will remain free from falls by discharge Description: Will remain free from falls by discharge Outcome: Progressing

## 2023-08-30 NOTE — Nursing Note (Signed)
 [facility] is on site to transport pt from [facility] to [facility]. Pt is on room air,vss and incontinent care performed.

## 2023-09-15 ENCOUNTER — Telehealth: Payer: Self-pay | Admitting: Neurology

## 2023-09-15 NOTE — Telephone Encounter (Signed)
 Called the pt there was no answer. Left a detailed message reminding the pt to arrive 30 min early for the visit tomorrow to allow for time to complete the memory follow up. Advised the patient to also bring machine and power cord to the visitso we could have a updated download.

## 2023-09-16 ENCOUNTER — Encounter: Payer: Self-pay | Admitting: Neurology

## 2023-09-16 ENCOUNTER — Ambulatory Visit: Admitting: Neurology

## 2023-09-16 VITALS — BP 135/91 | HR 75 | Ht 62.0 in

## 2023-09-16 DIAGNOSIS — I639 Cerebral infarction, unspecified: Secondary | ICD-10-CM | POA: Diagnosis not present

## 2023-09-16 DIAGNOSIS — I69354 Hemiplegia and hemiparesis following cerebral infarction affecting left non-dominant side: Secondary | ICD-10-CM | POA: Insufficient documentation

## 2023-09-16 DIAGNOSIS — D751 Secondary polycythemia: Secondary | ICD-10-CM | POA: Diagnosis not present

## 2023-09-16 MED ORDER — DONEPEZIL HCL 5 MG PO TABS: 5.0000 mg | ORAL_TABLET | Freq: Every day | ORAL | 0 refills | Status: DC

## 2023-09-16 NOTE — Progress Notes (Signed)
 Provider:  Dedra Gores, MD  Primary Care Physician:  Duwayne Katheryn HERO, PA [address] [place] [address]     Referring Provider: Duwayne Katheryn HERO Dor[place] [address] [place],  [place] [address]          Chief Complaint according to patient   Patient presents with:                HISTORY OF PRESENT ILLNESS:  Beth Haynes is a 78 y.o. female patient who is here for a recent stroke, seen  on  09/16/2023 .  She was [facility] and [facility] She as dx with erythrocytosis as a possible risk factor.   TSH normal.  Magnesium was normal. Hb a1 c  5.4.  I have the pleasure of seeing Beth Haynes today who has been a longtime established patient here in the past to evaluate her for memory deficits but also she has accompanied her husband here frequently for other neurologic appointments.  I am saddened to see that she has suffered a stroke, the stroke affected the right basal ganglia and was in the setting of permanent atrial fibrillation for which she is anticoagulated chronically anticoagulated on Eliquis .  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  The admission date was 12 July the discharge date from [facility] 18 July 98.  78 year old woman was transferred from [facility]  to [facility]  ( she fell at home and suffered a fracture left foot / knee-had to recover , needed  PT and  Rehab. After weeks of PT she couldn't walk still-  insurance did ceased payment - she went to [place]  again, after one night  has left  hemiparesis ) with left hemiparesis stroke protocol showed already chronic infarct deemed not to be a candidate for thrombolytic therapy, acute subcortical infarct was seen in the right precentral gyrus with advanced white matter disease and chronic lacunar infarcts in the past.  Patient has a history of atrial fibs.  Continue with the continuation of Eliquis  without antiplatelet agents was decided.  When the patient was initially  admitted she appeared confused but her sensorium cleared and on the 18th she was alert oriented x 3 and adequately conversant.   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    She was tested for the JAK2 mutation as a cause of possible or possible cause of hyper production of red blood cells.  She will follow-up with [facility] hematologist Dr. Madison after discharge regarding the polycythemia.  The patient had developed dysphagia and needs thickened liquids and pured food.  She remained on Eliquis  she was put on aspirin she was put on diltiazem  180 mg 24-hour capsules , she continues with vitamin D3 ( 50000)she was taken off Astelin nasal spray Zyrtec she was taken off Aricept  because of bradycardia of Kenalog cream hydrocortisone  cream, Vesicare and nasal spray fluticasone.  She had an echocardiogram with an ejection fraction of 65 to 70% the left ventricular ejection fraction was high normal.  The left [facility] was severely dilated and this associated with mild tricuspid regurgitation.  No pulmonary hypertension, she underwent on 14 July a modified barium swallow test speech therapy recommended thickened liquids because of aspiration noted.  She will follow with speech pathology.  Carotid Doppler showed mild reduction in volume of the bilateral internal carotid  arteries under 40%.  Ankles and knees were x-rayed.  Mild degenerative changes were present.  Fracture of the proximal femoral diaphysis with bone callus formation was seen at the left side left proximal fibula fracture.  Brain MRI on the 12th the day of admission showed a chronic and a more acute infarct.  The lacunar infarcts were chronic and known to us , the acute new infarct was within the right precentral gyrus.  Findings may also represent an underlying amyloid angiopathy.    05/23/22 ALL: Beth Haynes is a 78 y.o. female here today for follow up for complex sleep apnea previously treated with ASV  and memory loss. She has not used ASV in several years. Unable to tolerate. She has continued donepezil  10mg  daily. She feels it has helped with memory stability. She is tolerating well.    She reports memory is improved from last visit. She denies any concerns with recent or remote memory. She is able to perform self hygiene independently but states that Beth Haynes usually takes care of all household chores and finances. He usually prepares meals. She feels she could if needed. She rarely drives but was able to renew her license, yesterday.    She sleeps well. She could not tolerate PAP   Review of Systems: Out of a complete 14 system review, the patient complains of only the following symptoms, and all other reviewed systems are negative.:   Memory stable. - new hemiparesis.   Beth Haynes is a 78 y.o. female patient who is here for revisit 01/21/2023 for  memory care- she tested today 28/ 30 points on MMSE !  She is  fluent, eloquent and engaged. A far cry from her depressed and demented appearance 2021-2022.  She is no longer excessively sleepy, she still has a risk of falling.  She presented then with sleep apnea and brain bleed.  She had epistaxis  on another anticoagulation and switched to eloquis.    Chief concern according to patient :  she is cheerful , she is proud to tell me that she could be an advertisment for aricept :      01/21/2023    2:05 PM 05/22/2022    9:53 AM 11/07/2021   10:30 AM 05/07/2021   10:47 AM  MMSE - Mini Mental State Exam  Orientation to time 3 5 3 3   Orientation to Place 5 5 5 5   Registration 3 3 3 3   Attention/ Calculation 3 5 5 5   Recall 3 3 3 3   Language- name 2 objects 2 2 2 2   Language- repeat 1 1 1 1   Language- follow 3 step command 3 3 3 3   Language- read & follow direction 1 1 1 1   Write a sentence 1 0 1 1  Copy design 0 1 1 1   Total score 28 29 28 28        visio-spatial problems, the copied image of interlocking pentagram- it doesn't  interlock.  Clock face is distorted.       Referring Provider: Nahser, Aleene PARAS, MD           Social History   Socioeconomic History   Marital status: Married    Spouse name: Lynwood   Number of children: Not on file   Years of education: Not on file   Highest education level: Not on file  Occupational History   Not on file  Tobacco Use   Smoking status: Never   Smokeless tobacco: Never  Vaping Use  Vaping status: Never Used  Substance and Sexual Activity   Alcohol use: No   Drug use: No   Sexual activity: Not on file  Other Topics Concern   Not on file  Social History Narrative   Lives with husband   Right handed   Caffeine: none   Social Drivers of Health   Financial Resource Strain: Not on file  Food Insecurity: Low Risk  (08/24/2023)   [facility]   Hunger Vital Sign    Within the past 12 months, you worried that your food would run out before you got money to buy more: Never true    Within the past 12 months, the food you bought just didn't last and you didn't have money to get more. : Never true  Transportation Needs: No Transportation Needs (08/24/2023)   Received from [facility]    In the past 12 months, has lack of reliable transportation kept you from medical appointments, meetings, work or from getting things needed for daily living? : No  Physical Activity: Not on file  Stress: Not on file  Social Connections: Not on file    Family History  Problem Relation Age of Onset   Heart failure Mother    Heart attack Father    Hypertension Maternal Grandmother    Heart attack Paternal Grandfather    Breast cancer Maternal Aunt     Past Medical History:  Diagnosis Date   Atrial fibrillation (HCC)    Basal cell carcinoma 07/23/2018   right cheekbone-[id number]   IBS (irritable bowel syndrome)    Obesity (BMI 30-39.9) 07/11/2015   OSA (obstructive sleep apnea) 01/17/2015   Severe with AHI 35.8/hr   Panic attacks     Urinary incontinence, nocturnal enuresis     Past Surgical History:  Procedure Laterality Date   CARDIOVERSION N/A 04/02/2017   Procedure: CARDIOVERSION;  Surgeon: Raford Riggs, MD;  [facility]: [facility] ENDOSCOPY;  Service: Cardiovascular;  Laterality: N/A;   SKIN BIOPSY N/A 07/23/2018   mid upper back-moderate-0 tmt   TONSILLECTOMY       Current Outpatient Medications on File Prior to Visit  Medication Sig Dispense Refill   atorvastatin (LIPITOR) 40 MG tablet Take 40 mg by mouth at bedtime.     Cholecalciferol 1.25 MG (50000 UT) capsule Take 50,000 Units by mouth every Tuesday.     diltiazem  (CARDIZEM  CD) 180 MG 24 hr capsule Take 1 capsule (180 mg total) by mouth daily. 90 capsule 3   diltiazem  (TIAZAC ) 180 MG 24 hr capsule Take 180 mg by mouth daily.     Docusate Sodium  (DSS) 100 MG CAPS Take 100 mg by mouth daily.     ELIQUIS  5 MG TABS tablet TAKE 1 TABLET(5 MG) BY MOUTH TWICE DAILY 180 tablet 1   lisinopril (ZESTRIL) 20 MG tablet Take 20 mg by mouth daily.     metoprolol  succinate (TOPROL -XL) 100 MG 24 hr tablet TAKE 1 TABLET(100 MG) BY MOUTH DAILY WITH OR IMMEDIATELY FOLLOWING A MEAL 90 tablet 3   Polyethyl Glycol-Propyl Glycol (SYSTANE) 0.4-0.3 % SOLN Place 2 drops into both eyes 3 (three) times a week.     No current facility-administered medications on file prior to visit.    Allergies  Allergen Reactions   Sulfa Antibiotics Other (See Comments)    Childhood allergy    Codeine Other (See Comments)    Patient cannot recall her reaction.    Capsicum Annuum Extract & Derivative (Bell Pepper) [Capsicum] Diarrhea and  Nausea Only   Epinephrine  Other (See Comments)    Panic attacks      DIAGNOSTIC DATA (LABS, IMAGING, TESTING) - I reviewed patient records, labs, notes, testing and imaging myself where available.  Lab Results  Component Value Date   WBC 4.8 06/12/2023   HGB 16.7 (H) 06/12/2023   HCT 49.9 (H) 06/12/2023   MCV 90.4 06/12/2023   PLT 209 06/12/2023       Component Value Date/Time   NA 142 06/12/2023 1613   NA 142 12/21/2020 1138   K 4.1 06/12/2023 1613   CL 107 06/12/2023 1613   CO2 25 06/12/2023 1613   GLUCOSE 117 (H) 06/12/2023 1613   BUN 15 06/12/2023 1613   BUN 12 12/21/2020 1138   CREATININE 0.88 06/12/2023 1613   CREATININE 0.64 03/13/2015 1141   CALCIUM  10.2 06/12/2023 1613   PROT 6.1 (L) 06/12/2023 1613   PROT 6.1 12/21/2020 1138   ALBUMIN 3.7 06/12/2023 1613   ALBUMIN 4.2 12/21/2020 1138   AST 20 06/12/2023 1613   ALT 12 06/12/2023 1613   ALKPHOS 109 06/12/2023 1613   BILITOT 0.7 06/12/2023 1613   BILITOT 0.7 12/21/2020 1138   GFRNONAA >60 06/12/2023 1613   GFRAA >60 10/24/2017 1138   Lab Results  Component Value Date   CHOL 127 02/21/2017   HDL 37 (L) 02/21/2017   LDLCALC 77 02/21/2017   TRIG 66 02/21/2017   CHOLHDL 3.4 02/21/2017   Lab Results  Component Value Date   HGBA1C 5.2 02/21/2017   Lab Results  Component Value Date   VITAMINB12 325 02/21/2017   Lab Results  Component Value Date   TSH 2.112 02/21/2017    PHYSICAL EXAM:  Vitals:   09/16/23 0943  BP: (!) 135/91  Pulse: 75   No data found. Body mass index is 28.02 kg/m.   Wt Readings from Last 3 Encounters:  06/12/23 153 lb 3.5 oz (69.5 kg)  06/09/23 153 lb 3.5 oz (69.5 kg)  01/21/23 153 lb 3.2 oz (69.5 kg)     Ht Readings from Last 3 Encounters:  09/16/23 5' 2 (1.575 m)  06/12/23 5' 2 (1.575 m)  06/09/23 5' 2 (1.575 m)      General: The patient is awake, alert and appears not in acute distress and groomed. Head: Normocephalic, atraumatic.  Neck is supple. Respiratory: no shortness of breath  Skin:  With evidence of ankle edema, left ankle swelling-  or rash.    NEUROLOGIC EXAM: The patient is awake and alert, oriented to place and time.   Memory subjective described as intact.  Attention span & concentration ability appears normal.   Speech is fluent,  with mild dysarthria, mild  dysphonia but no aphasia.  Patient  is naturally left handed and was retrained to ue her right - ambidextrous.  Mood and affect are appropriate.   Cranial nerves: no loss of smell or taste reported  Pupils are equal and briskly reactive to light.  Extraocular movements in vertical and horizontal planes were intact and without nystagmus. No Diplopia. Visual fields by finger perimetry are intact. Hearing was intact to soft voice , is better on the left side   Facial sensation intact to fine touch.  Facial motor strength is symmetric and tongue deviated to the left  Neck ROM : rotation, tilt  normal   Motor exam:   left hemiparesis.    Sensory:  Fine touch , vibration  normal.  Proprioception tested in the upper extremities was normal.  Coordination: Rapid alternating movements in the fingers/hands were only possible on the right  Gait and station: wheelchair bound  Deep tendon reflexes: no spasticity ( yet)   ASSESSMENT AND PLAN :   78 y.o. year old female  here with: Stroke, acute -     1) erythrocytosis/ has an appointment with the hematologist.  Previous CBCs by [facility] labs have shown this to be not new.  She also had epistaxis in the past , on Eliquis  for atrial fib.  Severely dilated left [facility]. Dr Alveta .   2)  We had seen Ronal Cera for memory concerns, and placed her on Aricept  - her MMSE was  normal the last 2 visits. Now Aricept  has been  d/c due to bradycardia.     [facility] is now running out, insurance is no longer paying.   I feel Sirenia is not yet safe to return home.  Her home needs doors wide enough to a wheelchair, she needs a barrier free shower, needs PT and OT at home and daytime caregivers.   The patient needs urgently primary care follow up     I would like to thank Beane, Katheryn HERO, PA and Duwayne Katheryn HERO, [place] [address] [place],  [place] [address] for allowing me to meet with this pleasant patient.   his provider is the continuing focal point for all needed services for this  condition.  After spending a total time of  40  minutes face to face and time for  history taking, physical and neurologic examination, review of laboratory studies,  personal review of imaging studies, reports and results of other testing and review of referral information / records as far as provided in visit,   Electronically signed by: Dedra Gores, MD 09/16/2023 9:44 AM  [facility] Board certified by The [employer] of Sleep Medicine and Diplomate of the [employer] of Sleep Medicine. Board certified In Neurology through the ABPN, Fellow of the [employer] of Neurology.

## 2023-09-16 NOTE — Patient Instructions (Signed)
 ASSESSMENT AND PLAN :   78 y.o. year old female  here with: Stroke, acute -     1) erythrocytosis/ has an appointment with the hematologist.  Previous CBCs by [facility] labs have shown this to be not new.  She also had epistaxis in the past , on Eliquis  for atrial fib.  Severely dilated left [facility]. Dr Alveta .   2)  We had seen Ronal Cera for memory concerns, and placed her on Aricept  - her MMSE was  normal the last 2 visits. Now Aricept  has been  d/c due to bradycardia.     [facility] is now running out, insurance is no longer paying.   I feel Shawnda is not yet safe to return home.  Her home needs doors wide enough to a wheelchair, she needs a barrier free shower, needs PT and OT at home and daytime caregivers.   The patient needs urgently primary care follow up     Stroke Prevention Some medical conditions and lifestyle choices can lead to a higher risk for a stroke. You can help to prevent a stroke by eating healthy foods and exercising. It also helps to not smoke and to manage any health problems you may have. How can this condition affect me? A stroke is an emergency. It should be treated right away. A stroke can lead to brain damage or threaten your life. There is a better chance of surviving and getting better after a stroke if you get medical help right away. What can increase my risk? The following medical conditions may increase your risk of a stroke: Diseases of the heart and blood vessels (cardiovascular disease). High blood pressure (hypertension). Diabetes. High cholesterol. Sickle cell disease. Problems with blood clotting. Being very overweight. Sleeping problems (obstructivesleep apnea). Other risk factors include: Being older than age 51. A history of blood clots, stroke, or mini-stroke (TIA). Race, ethnic background, or a family history of stroke. Smoking or using tobacco products. Taking birth control pills, especially if you smoke. Heavy alcohol and  drug use. Not being active. What actions can I take to prevent this? Manage your health conditions High cholesterol. Eat a healthy diet. If this is not enough to manage your cholesterol, you may need to take medicines. Take medicines as told by your doctor. High blood pressure. Try to keep your blood pressure below 130/80. If your blood pressure cannot be managed through a healthy diet and regular exercise, you may need to take medicines. Take medicines as told by your doctor. Ask your doctor if you should check your blood pressure at home. Have your blood pressure checked every year. Diabetes. Eat a healthy diet and get regular exercise. If your blood sugar (glucose) cannot be managed through diet and exercise, you may need to take medicines. Take medicines as told by your doctor. Talk to your doctor about getting checked for sleeping problems. Signs of a problem can include: Snoring a lot. Feeling very tired. Make sure that you manage any other conditions you have. Nutrition  Follow instructions from your doctor about what to eat or drink. You may be told to: Eat and drink fewer calories each day. Limit how much salt (sodium) you use to 1,500 milligrams (mg) each day. Use only healthy fats for cooking, such as olive oil, canola oil, and sunflower oil. Eat healthy foods. To do this: Choose foods that are high in fiber. These include whole grains, and fresh fruits and vegetables. Eat at least 5 servings of  fruits and vegetables a day. Try to fill one-half of your plate with fruits and vegetables at each meal. Choose low-fat (lean) proteins. These include low-fat cuts of meat, chicken without skin, fish, tofu, beans, and nuts. Eat low-fat dairy products. Avoid foods that: Are high in salt. Have saturated fat. Have trans fat. Have cholesterol. Are processed or pre-made. Count how many carbohydrates you eat and drink each day. Lifestyle If you drink alcohol: Limit how much you  have to: 0-1 drink a day for women who are not pregnant. 0-2 drinks a day for men. Know how much alcohol is in your drink. In the [place] one drink equals one 12 oz bottle of beer ( ), one 5 oz glass of wine ( ), or one 1 oz glass of hard liquor (44mL). Do not smoke or use any products that have nicotine or tobacco. If you need help quitting, ask your doctor. Avoid secondhand smoke. Do not use drugs. Activity  Try to stay at a healthy weight. Get at least 30 minutes of exercise on most days, such as: Fast walking. Biking. Swimming. Medicines Take over-the-counter and prescription medicines only as told by your doctor. Avoid taking birth control pills. Talk to your doctor about the risks of taking birth control pills if: You are over 55 years old. You smoke. You get very bad headaches. You have had a blood clot. Where to find more information American Stroke Association: www.strokeassociation.org Get help right away if: You or a loved one has any signs of a stroke. BE FAST is an easy way to remember the warning signs: B - Balance. Dizziness, sudden trouble walking, or loss of balance. E - Eyes. Trouble seeing or a change in how you see. F - Face. Sudden weakness or loss of feeling of the face. The face or eyelid may droop on one side. A - Arms. Weakness or loss of feeling in an arm. This happens all of a sudden and most often on one side of the body. S - Speech. Sudden trouble speaking, slurred speech, or trouble understanding what people say. T - Time. Time to call emergency services. Write down what time symptoms started. You or a loved one has other signs of a stroke, such as: A sudden, very bad headache with no known cause. Feeling like you may vomit (nausea). Vomiting. A seizure. These symptoms may be an emergency. Get help right away. Call your local emergency services (911 in the [place].S[place] Do not wait to see if the symptoms will go away. Do not drive yourself to the  hospital. Summary You can help to prevent a stroke by eating healthy, exercising, and not smoking. It also helps to manage any health problems you have. Do not smoke or use any products that contain nicotine or tobacco. Get help right away if you or a loved one has any signs of a stroke. This information is not intended to replace advice given to you by your health care provider. Make sure you discuss any questions you have with your health care provider. Document Revised: 12/31/2021 Document Reviewed: 12/31/2021 Elsevier Patient Education  2024 [employer].

## 2023-09-17 ENCOUNTER — Telehealth: Payer: Self-pay | Admitting: Neurology

## 2023-09-17 NOTE — Telephone Encounter (Signed)
 Patient's husband said the facility is not giving her the donepezil  (ARICEPT ) 5 MG tablet. Husband asking if I need to give her this medication. Would like a call back after 4pm

## 2023-09-17 NOTE — Telephone Encounter (Signed)
 Spoke w/Pt husband regarding the donepezil  (Aricept ) being D/C'd. Husband stated he was not sure when it was stopped as Pt was at [facility] after her fall and leg fracture and went there again after her stroke. Informed him we will reach out to [facility] for information. Husband voiced thanks and asked for a call back when we get information.  Spoke with Monie F, LPN at [facility] [phone]) who confirmed donepezil  was D/C'd d/t bradycardia but she was unsure of the date. LPN stated she will reach out to the provider at [facility] to get more information and if donepezil  is contraindicated for Pt at this time. [facility] provider will contact [facility].  Spoke w/Pt husband to make him aware of information from [facility]. Husband stated he found that the hospital D/C'd the donepezil  when she was there after the stroke. Will make husband aware once provider from [facility] calls back. Husband voiced understanding and thanks.

## 2023-09-18 NOTE — Telephone Encounter (Signed)
 Spoke w/Pt husband to make him aware [facility] provider was in agreement to Pt starting back on donepezil . Husband stated he pleased and thanks for the call.

## 2023-09-18 NOTE — Telephone Encounter (Signed)
 Spoke w/Monie, LPN at [facility] who stated the provider there was in agreement to Pt starting back on donepezil  10 mg by mouth at bedtime. LPN stated it would be ordered from their pharmacy today. LPN voiced thanks for the follow up.

## 2023-09-22 NOTE — Telephone Encounter (Signed)
 Cld husband back regarding medication, donepezil. Husband states when he was at [facility] yesterday he was told Pt is not receiving the donepezil as they required an order. Gave nurse verbal order on Thurs 09/18/23. Informed husband will call [facility] again. Husband voiced thanks.  Cld [employer], spoke w/Monie, LPN for Pt who stated the order was entered. She will check MAR and call back to office.

## 2023-09-22 NOTE — Telephone Encounter (Signed)
 Pt husband called to speak to Nurse  about medication the patient states that [facility] has not reach out about Pt medication at Pharmacy.

## 2023-09-22 NOTE — Telephone Encounter (Signed)
 Nurse [facility] w/[facility] cld back to make us  aware Pt is taking donepezil . It is on the [facility] for the afternoon. Thanked nurse for call back.  Cld Pt husband to make him aware [facility] confirmed Pt is taking donepezil  and it is on her MAR for the afternoon. Husband voiced thanks for the call back.

## 2023-09-23 ENCOUNTER — Ambulatory Visit (INDEPENDENT_AMBULATORY_CARE_PROVIDER_SITE_OTHER): Admitting: Podiatry

## 2023-09-23 DIAGNOSIS — L600 Ingrowing nail: Secondary | ICD-10-CM | POA: Diagnosis not present

## 2023-09-23 NOTE — Progress Notes (Signed)
 [place] presents in a wheelchair.  They were taken taking her sock off and the nail came loose on the fifth toe on the right.  Has not been uncomfortable.  Does not recall any other injury to the toe.  Has not noticed any redness or significant drainage   Physical exam:  General appearance: Pleasant, and in no acute distress. AOx3.  Vascular: Pedal pulses: DP 2/4 bilaterally, PT 1/4 bilaterally. Mild edema lower legs bilaterally. Capillary fill time immediate bilateral.  Neurological: Light touch intact feet bilaterally.  Normal Achilles reflex bilaterally.  No clonus or spasticity noted.   Dermatologic:   Skin normal temperature bilaterally.  Skin normal color, tone, and texture bilaterally.   Musculoskeletal: Hammertoe deformities 2 through 5 right.  Some tenderness distal fifth toe right    Diagnosis: 1.  Ingrown nail fifth toe right  Plan: -New patient office visit for evaluation management. - Discussed with the injury of the nail.  Nail was extremely loose and able to remove it without much discomfort.  Applied antibiotic ointment and light dressing. -Wound care: Soak foot twice daily warm salt Epsom salt water 15 minutes, apply antibiotic ointment, and a light dressing.  Do this for 1 week then DC.  If not healed, call office Written wound care instructions given.  Return here in

## 2024-01-27 ENCOUNTER — Encounter: Payer: Self-pay | Admitting: Neurology

## 2024-01-27 ENCOUNTER — Ambulatory Visit: Payer: Medicare Other | Admitting: Neurology

## 2024-01-27 VITALS — BP 136/89 | HR 83 | Ht 62.0 in

## 2024-01-27 DIAGNOSIS — G4739 Other sleep apnea: Secondary | ICD-10-CM | POA: Diagnosis not present

## 2024-01-27 DIAGNOSIS — I639 Cerebral infarction, unspecified: Secondary | ICD-10-CM

## 2024-01-27 DIAGNOSIS — I69354 Hemiplegia and hemiparesis following cerebral infarction affecting left non-dominant side: Secondary | ICD-10-CM

## 2024-01-27 DIAGNOSIS — I5032 Chronic diastolic (congestive) heart failure: Secondary | ICD-10-CM

## 2024-01-27 DIAGNOSIS — F32A Depression, unspecified: Secondary | ICD-10-CM

## 2024-01-27 DIAGNOSIS — R4189 Other symptoms and signs involving cognitive functions and awareness: Secondary | ICD-10-CM | POA: Insufficient documentation

## 2024-01-27 DIAGNOSIS — I4819 Other persistent atrial fibrillation: Secondary | ICD-10-CM | POA: Diagnosis not present

## 2024-01-27 MED ORDER — SERTRALINE HCL 25 MG PO TABS: 25.0000 mg | ORAL_TABLET | Freq: Every day | ORAL | 3 refills | Status: AC

## 2024-01-27 MED ORDER — MELATONIN 3 MG PO TABS: 3.0000 mg | ORAL_TABLET | Freq: Every day | ORAL | 5 refills | Status: AC

## 2024-01-27 NOTE — Patient Instructions (Signed)
 Managing Depression, Adult Depression is a mental health condition that affects your thoughts, feelings, and actions. Being diagnosed with depression can bring you relief if you did not know why you have felt or behaved a certain way. It could also leave you feeling overwhelmed. Finding ways to manage your symptoms can help you feel more positive about your future. How to manage lifestyle changes Being depressed is difficult. Depression can increase the level of everyday stress. Stress can make depression symptoms worse. You may believe your symptoms cannot be managed or will never improve. However, there are many things you can try to help manage your symptoms. There is hope. Managing stress  Stress is your body's reaction to life changes and events, both good and bad. Stress can add to your feelings of depression. Learning to manage your stress can help lessen your feelings of depression. Try some of the following approaches to reducing your stress (stress reduction techniques): Listen to music that you enjoy and that inspires you. Try using a meditation app or take a meditation class. Develop a practice that helps you connect with your spiritual self. Walk in nature, pray, or go to a place of worship. Practice deep breathing. To do this, inhale slowly through your nose. Pause at the top of your inhale for a few seconds and then exhale slowly, letting yourself relax. Repeat this three or four times. Practice yoga to help relax and work your muscles. Choose a stress reduction technique that works for you. These techniques take time and practice to develop. Set aside 5-15 minutes a day to do them. Therapists can offer training in these techniques. Do these things to help manage stress: Keep a journal. Know your limits. Set healthy boundaries for yourself and others, such as saying no when you think something is too much. Pay attention to how you react to certain situations. You may not be able to  control everything, but you can change your reaction. Add humor to your life by watching funny movies or shows. Make time for activities that you enjoy and that relax you. Spend less time using electronics, especially at night before bed. The light from screens can make your brain think it is time to get up rather than go to bed.  Medicines Medicines, such as antidepressants, are often a part of treatment for depression. Talk with your pharmacist or health care provider about all the medicines, supplements, and herbal products that you take, their possible side effects, and what medicines and other products are safe to take together. Make sure to report any side effects you may have to your health care provider. Relationships Your health care provider may suggest family therapy, couples therapy, or individual therapy as part of your treatment. How to recognize changes Everyone responds differently to treatment for depression. As you recover from depression, you may start to: Have more interest in doing activities. Feel more hopeful. Have more energy. Eat a more regular amount of food. Have better mental focus. It is important to recognize if your depression is not getting better or is getting worse. The symptoms you had in the beginning may return, such as: Feeling tired. Eating too much or too little. Sleeping too much or too little. Feeling restless, agitated, or hopeless. Trouble focusing or making decisions. Having unexplained aches and pains. Feeling irritable, angry, or aggressive. If you or your family members notice these symptoms coming back, let your health care provider know right away. Follow these instructions at home: Activity Try to  get some form of exercise each day, such as walking. Try yoga, mindfulness, or other stress reduction techniques. Participate in group activities if you are able. Lifestyle Get enough sleep. Cut down on or stop using caffeine, tobacco,  alcohol, and any other harmful substances. Eat a healthy diet that includes plenty of vegetables, fruits, whole grains, low-fat dairy products, and lean protein. Limit foods that are high in solid fats, added sugar, or salt (sodium). General instructions Take over-the-counter and prescription medicines only as told by your health care provider. Keep all follow-up visits. It is important for your health care provider to check on your mood, behavior, and medicines. Your health care provider may need to make changes to your treatment. Where to find support Talking to others  Friends and family members can be sources of support and guidance. Talk to trusted friends or family members about your condition. Explain your symptoms and let them know that you are working with a health care provider to treat your depression. Tell friends and family how they can help. Finances Find mental health providers that fit with your financial situation. Talk with your health care provider if you are worried about access to food, housing, or medicine. Call your insurance company to learn about your co-pays and prescription plan. Where to find more information You can find support in your area from: Anxiety and Depression Association of America (ADAA): adaa.org Mental Health [place]: mentalhealthamerica.net [employer] on Mental Illness: nami.org Contact a health care provider if: You stop taking your antidepressant medicines, and you have any of these symptoms: Nausea. Headache. Light-headedness. Chills and body aches. Not being able to sleep (insomnia). You or your friends and family think your depression is getting worse. Get help right away if: You have thoughts of hurting yourself or others. Get help right away if you feel like you may hurt yourself or others, or have thoughts about taking your own life. Go to your nearest emergency room or: Call 911. Call the National Suicide Prevention Lifeline at  8507228223 or 988. This is open 24 hours a day. Text the Crisis Text Line at 8256113753. This information is not intended to replace advice given to you by your health care provider. Make sure you discuss any questions you have with your health care provider. Document Revised: 06/05/2021 Document Reviewed: 06/05/2021 Elsevier Patient Education  2024 [employer]  Problems With Thinking and Memory (Mild Neurocognitive Disorder): What to Know Mild neurocognitive disorder, formerly known as mild cognitive impairment, is a disorder where your memory doesn't work as well as it should. It may also affect other mental abilities like thinking, communicating, behavior, and being able to finish tasks. These problems can be noticed and measured. But they usually don't stop you from doing daily activities or living on your own. Mild neurocognitive disorder usually happens after 79 years of age. But it can also happen at younger ages. It's not as serious as major neurocognitive disorder, also known as dementia, but it may be the first sign of it. In general, the symptoms of this condition get worse over time. In rare cases, symptoms can get better. What are the causes? This condition may be caused by: Brain disorders like Alzheimer's disease, Parkinson's disease, and other conditions that slowly damage nerve cells. Diseases that affect the blood vessels in the brain and cause small strokes. Certain infections, like HIV. Traumatic brain injury. Other medical conditions, such as brain tumors, underactive thyroid  (hypothyroidism), and not having enough vitamin B12. Using certain drugs  or medicines. What increases the risk? Being older than 79 years of age. Being female. Having a lower level of education. Diabetes, high blood pressure, high cholesterol, and other conditions that raise the risk for blood vessel diseases. Untreated or undertreated sleep apnea. Having a certain type of gene that can be inherited,  or passed down from parent to child. Long-term health problems like heart disease, lung disease, liver disease, kidney disease, or depression. What are the signs or symptoms? Trouble remembering things. You may: Forget names, phone numbers, or details of recent events. Forget about social events and appointments. Often forget where you put your car keys or other items. Trouble thinking and solving problems. You may have trouble with complex tasks like: Paying bills. Driving in places you don't know well. Trouble communicating. You may have trouble: Finding the right word or naming an object. Forming a sentence that makes sense. Understanding what you read or hear. Changes in your behavior or personality. When this happens, you may: Lose interest in the things you used to enjoy. Avoid being around people. Get angry more easily than usual. Act before thinking. How is this diagnosed? This condition is diagnosed based on: Your symptoms. Your health care provider may ask you and the people you spend time with, like family and friends, about your symptoms. Memory tests and other tests to check how your brain is working. Your provider may refer you to a provider called a neurologist or a mental health specialist. To try to find out the cause of your condition, your provider may: Get a detailed medical history. Ask about use of alcohol, drugs, and medicines. Do a physical exam. Order blood tests and brain imaging tests. How is this treated? Mild neurocognitive disorder that's caused by medicine use, drug use, infection, or another medical condition may get better when the cause is treated, or when medicines or drugs are stopped. If this disorder has another cause, it usually doesn't improve and may get worse. In these cases, the goal of treatment is to help you manage the symptoms. This may include: Medicines to help with memory and behavior symptoms. Talk therapy. This provides education,  emotional support, memory aids, and other ways of making up for problems with mental tasks. Lifestyle changes. These may include: Getting regular exercise. Eating a healthy diet that includes omega-3 fatty acids. Doing things to challenge your thinking and memory skills. Spending more time being with and talking to other people. Using routines like having regular times for meals and going to bed. Follow these instructions at home: Eating and drinking  Drink more fluids as told. Eat a healthy diet that includes omega-3 fatty acids. These can be found in: Fish. Nuts. Leafy vegetables. Vegetable oils. If you drink alcohol: Limit how much you have to: 0-1 drink a day if you're female. 0-2 drinks a day if you're female. Know how much alcohol is in your drink. In the [place] one drink is one 12 oz bottle of beer (355 mL), one 5 oz glass of wine (148 mL), or one 1 oz glass of hard liquor (44 mL). Lifestyle  Get regular exercise as told by your provider. Do not smoke, vape, or use nicotine or tobacco. Use healthy ways to manage stress. If you need help managing stress, ask your provider. Keep spending time with other people. Keep your mind active by doing activities you enjoy, like reading or playing games. Make sure you get good sleep at night. These tips can help: Try not to  take naps during the day. Keep your bedroom dark and cool. Do not exercise in the few hours before you go to bed. Do not have foods or drinks with caffeine at night. General instructions Take medicines only as told. Your provider may tell you to avoid taking medicines that can affect thinking. These include some medicines for pain or sleeping. Work with your provider to find out: What things you need help with. What your safety needs are. Where to find more information [employer] on Aging: baseringtones.pl Contact a health care provider if: You have any new symptoms. Get help right away if: You have new  confusion or your confusion gets worse. You act in ways that put you or your family in danger. This information is not intended to replace advice given to you by your health care provider. Make sure you discuss any questions you have with your health care provider. Document Revised: 07/23/2022 Document Reviewed: 07/23/2022 Elsevier Patient Education  2024 [employer].

## 2024-01-27 NOTE — Progress Notes (Signed)
 Provider:  Dedra Gores, MD  Primary Care Physician:  Duwayne Katheryn HERO, PA [address] [place] [address]     Referring Provider: Duwayne Katheryn HERO [place] [address] [place],  [place] [address]          Chief Complaint according to patient   Patient presents with:                HISTORY OF PRESENT ILLNESS:  Beth Haynes is a 78 y.o. female patient who is here for revisit 01/27/2024 for  basal ganglion stroke, after persistent atrial fibrillation, complex sleep apnea syndrome.  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   She is conversant today, happy, giggling - very much at ease. Last visit  , she was very sad.   She is taking melatonin at 11 Pm and would like this to be noted in her orders.    Her husband was very concerned , financially worried:  and medical and care facility bills were mounting.       01/27/2024    1:37 PM 01/21/2023    2:05 PM 05/22/2022    9:53 AM 11/07/2021   10:30 AM 05/07/2021   10:47 AM  MMSE - Mini Mental State Exam  Orientation to time 4 3 5 3 3   Orientation to Place 5 5 5 5 5   Registration 3 3 3 3 3   Attention/ Calculation 5 3 5 5 5   Recall 3 3 3 3 3   Language- name 2 objects 2 2 2 2 2   Language- repeat 1 1 1 1 1   Language- follow 3 step command 3 3 3 3 3   Language- read & follow direction 1 1 1 1 1   Write a sentence 1 1 0 1 1  Copy design 1 0 1 1 1   Total score 29 25 29 28 28          Review of Systems: Out of a complete 14 system review, the patient complains of only the following symptoms, and all other reviewed systems are negative.:    Left hemiparesis     Social History   Socioeconomic History   Marital status: Married    Spouse name: Beth Haynes   Number of children: Not on file   Years of education: Not on file   Highest education level: Not on file  Occupational History   Not on file  Tobacco Use    Smoking status: Never   Smokeless tobacco: Never  Vaping Use   Vaping status: Never Used  Substance and Sexual Activity   Alcohol use: No   Drug use: No   Sexual activity: Not on file  Other Topics Concern   Not on file  Social History Narrative   Lives with husband   Right handed   Caffeine: none   Social Drivers of Health   Tobacco Use: Low Risk (01/27/2024)   Patient History    Smoking Tobacco Use: Never    Smokeless Tobacco Use: Never    Passive Exposure: Not on file  Financial Resource Strain: Not on file  Food Insecurity: Low Risk (08/24/2023)   Received from [facility]   Epic    Within the past 12 months, you worried that your food would run out before you got money to buy more: Never true    Within the past  12 months, the food you bought just didn't last and you didn't have money to get more. : Never true  Transportation Needs: No Transportation Needs (08/24/2023)   Received from [facility]    In the past 12 months, has lack of reliable transportation kept you from medical appointments, meetings, work or from getting things needed for daily living? : No  Physical Activity: Not on file  Stress: Not on file  Social Connections: Not on file  Depression (EYV7-0): Not on file  Alcohol Screen: Not on file  Housing: Low Risk (08/24/2023)   Received from [facility]   Epic    What is your living situation today?: I have a steady place to live    Think about the place you live. Do you have problems with any of the following? Choose all that apply:: None/None on this list  Utilities: Low Risk (08/24/2023)   Received from [facility]   Utilities    In the past 12 months has the electric, gas, oil, or water company threatened to shut off services in your home? : No  Health Literacy: Not on file    Family History  Problem Relation Age of Onset   Heart failure Mother    Heart attack Father    Hypertension Maternal Grandmother    Heart attack  Paternal Grandfather    Breast cancer Maternal Aunt     Past Medical History:  Diagnosis Date   Atrial fibrillation (HCC)    Basal cell carcinoma 07/23/2018   right cheekbone-[id number]   IBS (irritable bowel syndrome)    Obesity (BMI 30-39.9) 07/11/2015   OSA (obstructive sleep apnea) 01/17/2015   Severe with AHI 35.8/hr   Panic attacks    Urinary incontinence, nocturnal enuresis     Past Surgical History:  Procedure Laterality Date   CARDIOVERSION N/A 04/02/2017   Procedure: CARDIOVERSION;  Surgeon: Raford Riggs, MD;  Location: [facility] ENDOSCOPY;  Service: Cardiovascular;  Laterality: N/A;   SKIN BIOPSY N/A 07/23/2018   mid upper back-moderate-0 tmt   TONSILLECTOMY       Medications Ordered Prior to Encounter[1]  Allergies[2]   DIAGNOSTIC DATA (LABS, IMAGING, TESTING) - I reviewed patient records, labs, notes, testing and imaging myself where available.  Lab Results  Component Value Date   WBC 4.8 06/12/2023   HGB 16.7 (H) 06/12/2023   HCT 49.9 (H) 06/12/2023   MCV 90.4 06/12/2023   PLT 209 06/12/2023      Component Value Date/Time   NA 142 06/12/2023 1613   NA 142 12/21/2020 1138   K 4.1 06/12/2023 1613   CL 107 06/12/2023 1613   CO2 25 06/12/2023 1613   GLUCOSE 117 (H) 06/12/2023 1613   BUN 15 06/12/2023 1613   BUN 12 12/21/2020 1138   CREATININE 0.88 06/12/2023 1613   CREATININE 0.64 03/13/2015 1141   CALCIUM  10.2 06/12/2023 1613   PROT 6.1 (L) 06/12/2023 1613   PROT 6.1 12/21/2020 1138   ALBUMIN 3.7 06/12/2023 1613   ALBUMIN 4.2 12/21/2020 1138   AST 20 06/12/2023 1613   ALT 12 06/12/2023 1613   ALKPHOS 109 06/12/2023 1613   BILITOT 0.7 06/12/2023 1613   BILITOT 0.7 12/21/2020 1138   GFRNONAA >60 06/12/2023 1613   GFRAA >60 10/24/2017 1138   Lab Results  Component Value Date   CHOL 127 02/21/2017   HDL 37 (L) 02/21/2017   LDLCALC 77 02/21/2017   TRIG 66 02/21/2017   CHOLHDL 3.4 02/21/2017   Lab Results  Component Value Date   HGBA1C 5.2  02/21/2017   Lab Results  Component Value Date   VITAMINB12 325 02/21/2017   Lab Results  Component Value Date   TSH 2.112 02/21/2017    PHYSICAL EXAM:  Vitals:   01/27/24 1336  BP: 136/89  Pulse: 83   No data found. Body mass index is 28.02 kg/m.   Wt Readings from Last 3 Encounters:  06/12/23 153 lb 3.5 oz (69.5 kg)  06/09/23 153 lb 3.5 oz (69.5 kg)  01/21/23 153 lb 3.2 oz (69.5 kg)     Ht Readings from Last 3 Encounters:  01/27/24 5' 2 (1.575 m)  09/16/23 5' 2 (1.575 m)  06/12/23 5' 2 (1.575 m)      General: The patient is awake, alert and appears not in acute distress and groomed. Head: Normocephalic, atraumatic.  Neck is supple.  The patient is awake and alert, oriented to place and time.   Memory subjective described as intact.  Attention span & concentration ability appears normal.    Speech is fluent,  with mild dysarthria, mild  dysphonia but no aphasia.  Patient is naturally left handed and was retrained to ue her right - ambidextrous.  Mood and affect are appropriate.   Cranial nerves: no loss of smell or taste reported  Pupils are equal and briskly reactive to light.  Extraocular movements in vertical and horizontal planes were intact and without nystagmus. No Diplopia. Visual fields by finger perimetry are intact. Hearing was intact to soft voice , is better on the left side   Facial sensation intact to fine touch.  Facial motor strength is symmetric and tongue deviated to the left  Neck ROM : rotation, tilt  normal   Motor exam:   left hemiparesis.    Sensory:  Fine touch , vibration  normal.  Proprioception tested in the upper extremities was normal.   Coordination: Rapid alternating movements in the fingers/hands were only possible on the right  Gait and station: wheelchair bound  Deep tendon reflexes: no spasticity ( yet)   ASSESSMENT AND PLAN :   78 y.o. year old female  here with recent  Basal Ganglion Stroke  July 12 th 2025  . Stroke, acute - while on Eloquis.   while she was known to have had lacunar strokes of remote onset - this is  unforseen larger stroke in the right basal ganglia, affecting left body motor function and occurred while on Eliquis  for atrial fib.    Risk factor :    1) erythrocytosis/ has an appointment with the hematologist.  Previous CBCs by [facility] have shown this to be not new.  She also had epistaxis in the past, was deemed not a good ASA candidate.  On Eliquis  for atrial fibrillation in the context of a severely dilated left atrium. Saw Dr Alveta . JAK 2 test has not returned yet.    2)  We had seen Ronal Cera for memory concerns and I had placed her on Aricept  - her MMSE was normal the last 2 visits.  She has been scoring 29/ 30 on MMSE and  is much less depressed than in the past- pseudodementia.  ZOLOFT  to continue,  Aricept  to continue at 5 mg    Now Aricept  had been  d/c in hospital due to bradycardia ?  Here , her heart rate was 75 bpm.  Please continue  Aricept  unless there is clear contraindication.  Cardiology to give additional input.  Dr Francyne .  3) not addressed today was the use of  ASV for central complex sleep apnea, severe.  She is not wanting to resume PAP therapy.   I would like to thank Duwayne Katheryn HERO, PA for allowing me to meet with this pleasant patient.    Any patient with sleepiness should be cautioned not to drive, work at heights, or operate dangerous or heavy equipment when feeling tired or sleepy.      The patient will be seen in follow-up in the sleep clinic at [facility] for discussion of test results, sleep related symptoms and treatment compliance review, further management strategies, etc.   The referring provider will be notified of the test results.   The patient's condition requires frequent monitoring and adjustments in the treatment plan, reflecting the ongoing complexity of care.  This provider is the continuing focal point for all needed  services for this condition.  After spending a total time of  45  minutes face to face and time for  history taking, physical and neurologic examination, review of laboratory studies,  personal review of imaging studies, reports and results of other testing and review of referral information / records as far as provided in visit,   Electronically signed by: Dedra Gores, MD 01/27/2024 2:06 PM  [facility] Board certified by The [employer] of Sleep Medicine and Diplomate of the [employer] of Sleep Medicine. Board certified In Neurology through the ABPN, Fellow of the [employer] of Neu[employer].      [1]  Current Outpatient Medications on File Prior to Visit  Medication Sig Dispense Refill   acetaminophen  (TYLENOL ) 325 MG tablet Take 650 mg by mouth every 6 (six) hours as needed.     atorvastatin (LIPITOR) 40 MG tablet Take 40 mg by mouth at bedtime.     Cholecalciferol 1.25 MG (50000 UT) capsule Take 50,000 Units by mouth every Tuesday.     diltiazem  (CARDIZEM  CD) 180 MG 24 hr capsule Take 1 capsule (180 mg total) by mouth daily. 90 capsule 3   diltiazem  (TIAZAC ) 180 MG 24 hr capsule Take 180 mg by mouth daily.     donepezil  (ARICEPT ) 5 MG tablet Take 1 tablet (5 mg total) by mouth at bedtime. 30 tablet 0   ELIQUIS  5 MG TABS tablet TAKE 1 TABLET(5 MG) BY MOUTH TWICE DAILY 180 tablet 1   lisinopril (ZESTRIL) 20 MG tablet Take 20 mg by mouth daily.     metoprolol  succinate (TOPROL -XL) 100 MG 24 hr tablet TAKE 1 TABLET(100 MG) BY MOUTH DAILY WITH OR IMMEDIATELY FOLLOWING A MEAL 90 tablet 3   sertraline  (ZOLOFT ) 25 MG tablet Take 25 mg by mouth daily.     No current facility-administered medications on file prior to visit.  [2]  Allergies Allergen Reactions   Sulfa Antibiotics Other (See Comments)    Childhood allergy    Codeine Other (See Comments)    Patient cannot recall her reaction.    Capsicum Annuum Extract & Derivative (Bell  Pepper) [Capsicum] Diarrhea and Nausea Only   Epinephrine  Other (See Comments)    Panic attacks

## 2024-02-18 ENCOUNTER — Encounter: Payer: Self-pay | Admitting: Cardiovascular Disease

## 2024-02-18 ENCOUNTER — Ambulatory Visit: Attending: Cardiovascular Disease | Admitting: Cardiovascular Disease

## 2024-02-18 VITALS — BP 132/74 | HR 72 | Ht 62.5 in | Wt 163.0 lb

## 2024-02-18 DIAGNOSIS — G4733 Obstructive sleep apnea (adult) (pediatric): Secondary | ICD-10-CM | POA: Diagnosis not present

## 2024-02-18 DIAGNOSIS — I10 Essential (primary) hypertension: Secondary | ICD-10-CM

## 2024-02-18 DIAGNOSIS — D6869 Other thrombophilia: Secondary | ICD-10-CM | POA: Diagnosis not present

## 2024-02-18 DIAGNOSIS — R051 Acute cough: Secondary | ICD-10-CM

## 2024-02-18 DIAGNOSIS — I4819 Other persistent atrial fibrillation: Secondary | ICD-10-CM | POA: Diagnosis not present

## 2024-02-18 DIAGNOSIS — I5032 Chronic diastolic (congestive) heart failure: Secondary | ICD-10-CM

## 2024-02-18 MED ORDER — BENZONATATE 100 MG PO CAPS: 100.0000 mg | ORAL_CAPSULE | Freq: Every day | ORAL | 0 refills | Status: AC | PRN

## 2024-02-18 NOTE — Patient Instructions (Signed)
 Medication Instructions:  Start Tessalon  100 mg take one tablet daily as needed for cough *If you need a refill on your cardiac medications before your next appointment, please call your pharmacy*    Follow-Up: At [facility], you and your health needs are our priority.  As part of our continuing mission to provide you with exceptional heart care, our providers are all part of one team.  This team includes your primary Cardiologist (physician) and Advanced Practice Providers or APPs (Physician Assistants and Nurse Practitioners) who all work together to provide you with the care you need, when you need it.  Your next appointment:   1 year(s)  Provider:   Jerel Balding, MD

## 2024-02-18 NOTE — Progress Notes (Signed)
 " Cardiology Office Note   Date:  02/21/2024  ID:  Beth, Haynes September 09, 1945, MRN [id number] PCP: Duwayne Katheryn HERO, PA  [facility] Providers Cardiologist:  Jerel Balding, MD     History of Present Illness Beth Haynes is a 79 y.o. female with history of persistent atrial fibrillation, chronic HFpEF, hypercholesterolemia, hypertension, OSA (Dr. Chalice), history of depression here to transition cardiology care after Dr. Allena retirement.  She is accompanied by her husband.  She feels that she is doing very well, except for nagging cough from a recent upper respiratory tract infection..  The patient specifically denies any chest pain at rest or with exertion, dyspnea at rest or with exertion, orthopnea, paroxysmal nocturnal dyspnea, syncope, palpitations, focal neurological deficits, intermittent claudication, lower extremity edema, unexplained weight gain, , hemoptysis or wheezing, fever or chills.   She is a resident at [facility].  She was hospitalized at [facility] in July with slurred speech and found to have acute subcortical infarct in the right precentral gyrus (in addition to chronic lacunar infarcts in the right putamen), most.  She was switched from Eliquis  to Xarelto .  Reportedly she had ventricular rates in the 30s and 50s on arrival so her diltiazem  was held.  She had an echocardiogram during that hospitalization that showed normal left ventricular systolic function with EF 65-70%, mild LVH, severely dilated left atrium, aortic valve sclerosis with trace AI, mild mitral annular calcification with trace MR mild TR with estimated systolic RV pressure of 31 mmHg.  She had cardioversions in the past but had recurrence of atrial fibrillation and she has been managed as longstanding persistent atrial fibrillation for the last 5 years or so.  She is unaware of the arrhythmia.  She is on chronic anticoagulation with Eliquis  and has not had any bleeding problems or falls.  She has not had  any strokes or other systemic embolism events.  Rate control is with a combination of diltiazem  and metoprolol  both at moderate doses.  She also takes lisinopril for hypertension and atorvastatin for hypercholesterolemia.  She has lost weight and her BMI is now less than 30.  She had an episode of heart failure in 2019 (A-fib with RVR and urinary tract infection).  Later that year she had a fall complicated by subdural hematoma but she has not had any subsequent falls or injuries.  She has not had any problems with heart failure since then and is not taking any diuretics.  Her most recent echocardiogram in our system was performed in 2019 and shows normal LVEF of 55 to 60%, severely dilated left atrium, mild mitral regurgitation, aortic valve sclerosis without stenosis.  Imaging studies have shown incidental aortic atherosclerosis and three-vessel coronary artery calcification.  MRI and CT angiogram of the head and neck in 2022 did not show any large vessel occlusion or stenoses and showed some old small vessel cerebral infarcts and generalized atrophy.    Studies Reviewed    Personally reviewed the most recent ECG from 06/13/2023 which shows atrial fibrillation and a moderate nonspecific intraventricular conduction delay with right axis deviation Echocardiogram 2019  CT angiogram of the chest 2019 MRI brain and CT angiogram head and neck 2022 Report of echocardiogram from [facility] 08/25/2023  Risk Assessment/Calculations  CHA2DS2-VASc Score = 7   This indicates a 11.2% annual risk of stroke. The patient's score is based upon: CHF History: 1 HTN History: 1 Diabetes History: 0 Stroke History: 2 Vascular Disease History: 0 Age Score: 2  Gender Score: 1         Physical Exam VS:  BP 132/74 (BP Location: Left Arm, Patient Position: Sitting, Cuff Size: Large)   Pulse 72   Ht 5' 2.5 (1.588 m)   Wt 163 lb (73.9 kg) Comment: Last weighed at facility 01/14/2024  SpO2 95%   BMI 29.34 kg/m         Wt Readings from Last 3 Encounters:  02/18/24 163 lb (73.9 kg)  06/12/23 153 lb 3.5 oz (69.5 kg)  06/09/23 153 lb 3.5 oz (69.5 kg)    GEN: Well nourished, well developed in no acute distress NECK: No JVD; No carotid bruits CARDIAC: Irregular, no murmurs, rubs, gallops RESPIRATORY:  Clear to auscultation without rales, wheezing or rhonchi  ABDOMEN: Soft, non-tender, non-distended EXTREMITIES:  No edema; No deformity   ASSESSMENT AND PLAN AFib: Permanent arrhythmia.  Severely dilated left atrium.  Appropriately anticoagulated and rate controlled.  Reportedly had bradycardia in the hospital but her ventricular rate is in optimal range today.  We can always reduce the dose of diltiazem  or metoprolol  to allow her to take the appropriate dose of donepezil , if this causes bradycardia. HFpEF: This was only manifest during an episode of acute atrial fibrillation with RVR in the setting of urinary tract infection years ago.  She is euvolemic without loop diuretics. Anticoagulation: Had a subdural hematoma in 2019, but no problems since. HTN: Well-controlled, continue the same medications. OSA: Complex sleep apnea syndrome.  Followed by Dr. Chalice.  The patient has declined positive airway pressure therapy. History of strokes: As far as I can tell all of these are more likely to be small vessel strokes rather than embolic events from atrial fibrillation.  I am also not sure that Xarelto  offers any benefit over Eliquis .  If we truly believe that she has a anti-factor Xa failure we can consider switching to dabigatran.  I do not think that is necessary at this time. Cough: Give her a prescription for Tessalon  [place] as needed.       Dispo:  Patient Instructions  Medication Instructions:  Start Tessalon  100 mg take one tablet daily as needed for cough *If you need a refill on your cardiac medications before your next appointment, please call your pharmacy*    Follow-Up: At [facility], you and your health needs are our priority.  As part of our continuing mission to provide you with exceptional heart care, our providers are all part of one team.  This team includes your primary Cardiologist (physician) and Advanced Practice Providers or APPs (Physician Assistants and Nurse Practitioners) who all work together to provide you with the care you need, when you need it.  Your next appointment:   1 year(s)  Provider:   Jerel Balding, MD              Signed, Jerel Balding, MD   "

## 2024-02-27 ENCOUNTER — Telehealth: Payer: Self-pay | Admitting: Neurology

## 2024-02-27 NOTE — Telephone Encounter (Signed)
 Pt husband called stating that Pharmacy  is stating that Pt medication is ready to be picked up  sertraline  (ZOLOFT ) 25 MG tablet Pt husband stated  Wife is in facility  so mediation should be sent to them. Pt husband has  some Question and concern he would like to speak to Nurse about

## 2024-03-02 NOTE — Telephone Encounter (Signed)
 I called husband.  Dr. Chalice had sent a prescription for sertraline  25mg  po daily at [facility].  Pt resides at [facility].  I called [phone] spoke to [place].  She said pt is already on this medication , takes in am.  I relayed that if provider could change to pm dosing per husbands request (due to her sleepy more during the day).  She said she would check on this.  I relayed this to husband and she appreciated call back.

## 2024-03-04 ENCOUNTER — Other Ambulatory Visit: Payer: Self-pay | Admitting: Neurology

## 2024-03-04 MED ORDER — DONEPEZIL HCL 10 MG PO TABS: 10.0000 mg | ORAL_TABLET | Freq: Every day | ORAL | 3 refills | Status: AC

## 2024-03-04 NOTE — Progress Notes (Signed)
 I appreciate Dr Tyrone note and will keep Ronal Cera on Donepezil  10 mg daily.   She is not presenting with  bradycardia . Dedra Gores, MD

## 2024-09-27 ENCOUNTER — Ambulatory Visit: Admitting: Neurology
